# Patient Record
Sex: Male | Born: 1945 | Race: White | Hispanic: No | Marital: Married | Smoking: Former smoker
Health system: Southern US, Community
[De-identification: ages and names within clinical notes are randomized; demographics above are authoritative.]

## PROBLEM LIST (undated history)

## (undated) DIAGNOSIS — M1990 Unspecified osteoarthritis, unspecified site: Secondary | ICD-10-CM

## (undated) DIAGNOSIS — F32A Depression, unspecified: Secondary | ICD-10-CM

## (undated) DIAGNOSIS — F419 Anxiety disorder, unspecified: Secondary | ICD-10-CM

## (undated) DIAGNOSIS — Z87442 Personal history of urinary calculi: Secondary | ICD-10-CM

## (undated) DIAGNOSIS — I499 Cardiac arrhythmia, unspecified: Secondary | ICD-10-CM

## (undated) DIAGNOSIS — I10 Essential (primary) hypertension: Secondary | ICD-10-CM

## (undated) DIAGNOSIS — G4730 Sleep apnea, unspecified: Secondary | ICD-10-CM

## (undated) DIAGNOSIS — F329 Major depressive disorder, single episode, unspecified: Secondary | ICD-10-CM

## (undated) DIAGNOSIS — T8859XA Other complications of anesthesia, initial encounter: Secondary | ICD-10-CM

## (undated) HISTORY — DX: Anxiety disorder, unspecified: F41.9

## (undated) HISTORY — DX: Essential (primary) hypertension: I10

## (undated) HISTORY — PX: TONSILLECTOMY AND ADENOIDECTOMY: SUR1326

## (undated) HISTORY — DX: Unspecified osteoarthritis, unspecified site: M19.90

---
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
## 1988-03-08 HISTORY — PX: VASECTOMY: SHX75

## 2012-11-14 DIAGNOSIS — Z23 Encounter for immunization: Secondary | ICD-10-CM | POA: Diagnosis not present

## 2012-12-29 DIAGNOSIS — F3289 Other specified depressive episodes: Secondary | ICD-10-CM | POA: Diagnosis not present

## 2012-12-29 DIAGNOSIS — F329 Major depressive disorder, single episode, unspecified: Secondary | ICD-10-CM | POA: Diagnosis not present

## 2012-12-29 DIAGNOSIS — F988 Other specified behavioral and emotional disorders with onset usually occurring in childhood and adolescence: Secondary | ICD-10-CM | POA: Diagnosis not present

## 2013-02-26 DIAGNOSIS — M19079 Primary osteoarthritis, unspecified ankle and foot: Secondary | ICD-10-CM | POA: Diagnosis not present

## 2013-02-26 DIAGNOSIS — F988 Other specified behavioral and emotional disorders with onset usually occurring in childhood and adolescence: Secondary | ICD-10-CM | POA: Diagnosis not present

## 2013-02-26 DIAGNOSIS — E7800 Pure hypercholesterolemia, unspecified: Secondary | ICD-10-CM | POA: Diagnosis not present

## 2013-03-21 DIAGNOSIS — F329 Major depressive disorder, single episode, unspecified: Secondary | ICD-10-CM | POA: Diagnosis not present

## 2013-03-21 DIAGNOSIS — F3289 Other specified depressive episodes: Secondary | ICD-10-CM | POA: Diagnosis not present

## 2013-03-21 DIAGNOSIS — E7800 Pure hypercholesterolemia, unspecified: Secondary | ICD-10-CM | POA: Diagnosis not present

## 2013-03-21 DIAGNOSIS — F988 Other specified behavioral and emotional disorders with onset usually occurring in childhood and adolescence: Secondary | ICD-10-CM | POA: Diagnosis not present

## 2013-03-21 DIAGNOSIS — Z79899 Other long term (current) drug therapy: Secondary | ICD-10-CM | POA: Diagnosis not present

## 2013-03-21 DIAGNOSIS — F4322 Adjustment disorder with anxiety: Secondary | ICD-10-CM | POA: Diagnosis not present

## 2013-03-21 DIAGNOSIS — N200 Calculus of kidney: Secondary | ICD-10-CM | POA: Diagnosis not present

## 2013-03-28 DIAGNOSIS — N138 Other obstructive and reflux uropathy: Secondary | ICD-10-CM | POA: Diagnosis not present

## 2013-03-28 DIAGNOSIS — Z125 Encounter for screening for malignant neoplasm of prostate: Secondary | ICD-10-CM | POA: Diagnosis not present

## 2013-03-28 DIAGNOSIS — N200 Calculus of kidney: Secondary | ICD-10-CM | POA: Diagnosis not present

## 2013-03-28 DIAGNOSIS — R319 Hematuria, unspecified: Secondary | ICD-10-CM | POA: Diagnosis not present

## 2013-03-28 DIAGNOSIS — N401 Enlarged prostate with lower urinary tract symptoms: Secondary | ICD-10-CM | POA: Diagnosis not present

## 2013-03-28 DIAGNOSIS — R109 Unspecified abdominal pain: Secondary | ICD-10-CM | POA: Diagnosis not present

## 2013-04-03 DIAGNOSIS — L0291 Cutaneous abscess, unspecified: Secondary | ICD-10-CM | POA: Diagnosis not present

## 2013-04-03 DIAGNOSIS — L02219 Cutaneous abscess of trunk, unspecified: Secondary | ICD-10-CM | POA: Diagnosis not present

## 2013-04-03 DIAGNOSIS — L03319 Cellulitis of trunk, unspecified: Secondary | ICD-10-CM | POA: Diagnosis not present

## 2013-04-04 DIAGNOSIS — N138 Other obstructive and reflux uropathy: Secondary | ICD-10-CM | POA: Diagnosis not present

## 2013-04-04 DIAGNOSIS — N401 Enlarged prostate with lower urinary tract symptoms: Secondary | ICD-10-CM | POA: Diagnosis not present

## 2013-04-04 DIAGNOSIS — R109 Unspecified abdominal pain: Secondary | ICD-10-CM | POA: Diagnosis not present

## 2013-04-04 DIAGNOSIS — N200 Calculus of kidney: Secondary | ICD-10-CM | POA: Diagnosis not present

## 2013-04-10 DIAGNOSIS — L723 Sebaceous cyst: Secondary | ICD-10-CM | POA: Diagnosis not present

## 2013-04-12 DIAGNOSIS — K2970 Gastritis, unspecified, without bleeding: Secondary | ICD-10-CM | POA: Diagnosis not present

## 2013-04-12 DIAGNOSIS — F988 Other specified behavioral and emotional disorders with onset usually occurring in childhood and adolescence: Secondary | ICD-10-CM | POA: Diagnosis not present

## 2013-04-12 DIAGNOSIS — K2990 Gastroduodenitis, unspecified, without bleeding: Secondary | ICD-10-CM | POA: Diagnosis not present

## 2013-04-12 DIAGNOSIS — L723 Sebaceous cyst: Secondary | ICD-10-CM | POA: Diagnosis not present

## 2013-04-18 DIAGNOSIS — R195 Other fecal abnormalities: Secondary | ICD-10-CM | POA: Diagnosis not present

## 2013-04-18 DIAGNOSIS — R1013 Epigastric pain: Secondary | ICD-10-CM | POA: Diagnosis not present

## 2013-04-18 DIAGNOSIS — Z1211 Encounter for screening for malignant neoplasm of colon: Secondary | ICD-10-CM | POA: Diagnosis not present

## 2013-04-20 DIAGNOSIS — Z87891 Personal history of nicotine dependence: Secondary | ICD-10-CM | POA: Diagnosis not present

## 2013-04-20 DIAGNOSIS — K921 Melena: Secondary | ICD-10-CM | POA: Diagnosis not present

## 2013-04-20 DIAGNOSIS — K263 Acute duodenal ulcer without hemorrhage or perforation: Secondary | ICD-10-CM | POA: Diagnosis not present

## 2013-04-20 DIAGNOSIS — R195 Other fecal abnormalities: Secondary | ICD-10-CM | POA: Diagnosis not present

## 2013-04-20 DIAGNOSIS — K449 Diaphragmatic hernia without obstruction or gangrene: Secondary | ICD-10-CM | POA: Diagnosis not present

## 2013-04-20 DIAGNOSIS — R1013 Epigastric pain: Secondary | ICD-10-CM | POA: Diagnosis not present

## 2013-04-20 DIAGNOSIS — K222 Esophageal obstruction: Secondary | ICD-10-CM | POA: Diagnosis not present

## 2013-04-20 DIAGNOSIS — I10 Essential (primary) hypertension: Secondary | ICD-10-CM | POA: Diagnosis not present

## 2013-04-20 DIAGNOSIS — R109 Unspecified abdominal pain: Secondary | ICD-10-CM | POA: Diagnosis not present

## 2013-04-20 DIAGNOSIS — K269 Duodenal ulcer, unspecified as acute or chronic, without hemorrhage or perforation: Secondary | ICD-10-CM | POA: Diagnosis not present

## 2013-04-30 DIAGNOSIS — D649 Anemia, unspecified: Secondary | ICD-10-CM | POA: Diagnosis not present

## 2013-05-04 DIAGNOSIS — I10 Essential (primary) hypertension: Secondary | ICD-10-CM | POA: Diagnosis not present

## 2013-06-14 DIAGNOSIS — R195 Other fecal abnormalities: Secondary | ICD-10-CM | POA: Diagnosis not present

## 2013-06-14 DIAGNOSIS — K265 Chronic or unspecified duodenal ulcer with perforation: Secondary | ICD-10-CM | POA: Diagnosis not present

## 2013-06-22 DIAGNOSIS — N182 Chronic kidney disease, stage 2 (mild): Secondary | ICD-10-CM | POA: Diagnosis not present

## 2013-06-22 DIAGNOSIS — M549 Dorsalgia, unspecified: Secondary | ICD-10-CM | POA: Diagnosis not present

## 2013-06-22 DIAGNOSIS — G4710 Hypersomnia, unspecified: Secondary | ICD-10-CM | POA: Diagnosis not present

## 2013-06-22 DIAGNOSIS — M7989 Other specified soft tissue disorders: Secondary | ICD-10-CM | POA: Diagnosis not present

## 2013-06-26 DIAGNOSIS — M7989 Other specified soft tissue disorders: Secondary | ICD-10-CM | POA: Diagnosis not present

## 2013-06-26 DIAGNOSIS — R609 Edema, unspecified: Secondary | ICD-10-CM | POA: Diagnosis not present

## 2013-08-01 DIAGNOSIS — M5020 Other cervical disc displacement, unspecified cervical region: Secondary | ICD-10-CM | POA: Diagnosis not present

## 2013-08-01 DIAGNOSIS — M9981 Other biomechanical lesions of cervical region: Secondary | ICD-10-CM | POA: Diagnosis not present

## 2013-08-01 DIAGNOSIS — M999 Biomechanical lesion, unspecified: Secondary | ICD-10-CM | POA: Diagnosis not present

## 2013-08-09 DIAGNOSIS — N182 Chronic kidney disease, stage 2 (mild): Secondary | ICD-10-CM | POA: Diagnosis not present

## 2013-08-14 DIAGNOSIS — M999 Biomechanical lesion, unspecified: Secondary | ICD-10-CM | POA: Diagnosis not present

## 2013-08-14 DIAGNOSIS — E876 Hypokalemia: Secondary | ICD-10-CM | POA: Diagnosis not present

## 2013-08-14 DIAGNOSIS — M5020 Other cervical disc displacement, unspecified cervical region: Secondary | ICD-10-CM | POA: Diagnosis not present

## 2013-08-14 DIAGNOSIS — M9981 Other biomechanical lesions of cervical region: Secondary | ICD-10-CM | POA: Diagnosis not present

## 2013-08-16 DIAGNOSIS — M9981 Other biomechanical lesions of cervical region: Secondary | ICD-10-CM | POA: Diagnosis not present

## 2013-08-16 DIAGNOSIS — M999 Biomechanical lesion, unspecified: Secondary | ICD-10-CM | POA: Diagnosis not present

## 2013-08-16 DIAGNOSIS — M5020 Other cervical disc displacement, unspecified cervical region: Secondary | ICD-10-CM | POA: Diagnosis not present

## 2013-08-21 DIAGNOSIS — M5020 Other cervical disc displacement, unspecified cervical region: Secondary | ICD-10-CM | POA: Diagnosis not present

## 2013-08-21 DIAGNOSIS — M999 Biomechanical lesion, unspecified: Secondary | ICD-10-CM | POA: Diagnosis not present

## 2013-08-21 DIAGNOSIS — E876 Hypokalemia: Secondary | ICD-10-CM | POA: Diagnosis not present

## 2013-08-21 DIAGNOSIS — M9981 Other biomechanical lesions of cervical region: Secondary | ICD-10-CM | POA: Diagnosis not present

## 2013-09-04 DIAGNOSIS — M999 Biomechanical lesion, unspecified: Secondary | ICD-10-CM | POA: Diagnosis not present

## 2013-09-04 DIAGNOSIS — M9981 Other biomechanical lesions of cervical region: Secondary | ICD-10-CM | POA: Diagnosis not present

## 2013-09-04 DIAGNOSIS — E876 Hypokalemia: Secondary | ICD-10-CM | POA: Diagnosis not present

## 2013-09-04 DIAGNOSIS — M5020 Other cervical disc displacement, unspecified cervical region: Secondary | ICD-10-CM | POA: Diagnosis not present

## 2013-09-20 DIAGNOSIS — M9981 Other biomechanical lesions of cervical region: Secondary | ICD-10-CM | POA: Diagnosis not present

## 2013-09-20 DIAGNOSIS — M5020 Other cervical disc displacement, unspecified cervical region: Secondary | ICD-10-CM | POA: Diagnosis not present

## 2013-09-20 DIAGNOSIS — M999 Biomechanical lesion, unspecified: Secondary | ICD-10-CM | POA: Diagnosis not present

## 2013-09-24 DIAGNOSIS — N182 Chronic kidney disease, stage 2 (mild): Secondary | ICD-10-CM | POA: Diagnosis not present

## 2013-09-27 DIAGNOSIS — M25569 Pain in unspecified knee: Secondary | ICD-10-CM | POA: Diagnosis not present

## 2013-09-27 DIAGNOSIS — N182 Chronic kidney disease, stage 2 (mild): Secondary | ICD-10-CM | POA: Diagnosis not present

## 2013-09-27 DIAGNOSIS — M25519 Pain in unspecified shoulder: Secondary | ICD-10-CM | POA: Diagnosis not present

## 2013-10-10 DIAGNOSIS — M25819 Other specified joint disorders, unspecified shoulder: Secondary | ICD-10-CM | POA: Diagnosis not present

## 2013-12-11 DIAGNOSIS — M9902 Segmental and somatic dysfunction of thoracic region: Secondary | ICD-10-CM | POA: Diagnosis not present

## 2013-12-11 DIAGNOSIS — M9905 Segmental and somatic dysfunction of pelvic region: Secondary | ICD-10-CM | POA: Diagnosis not present

## 2013-12-11 DIAGNOSIS — M5413 Radiculopathy, cervicothoracic region: Secondary | ICD-10-CM | POA: Diagnosis not present

## 2013-12-11 DIAGNOSIS — M9901 Segmental and somatic dysfunction of cervical region: Secondary | ICD-10-CM | POA: Diagnosis not present

## 2013-12-12 DIAGNOSIS — M1711 Unilateral primary osteoarthritis, right knee: Secondary | ICD-10-CM | POA: Diagnosis not present

## 2014-01-01 DIAGNOSIS — Z23 Encounter for immunization: Secondary | ICD-10-CM | POA: Diagnosis not present

## 2014-01-01 DIAGNOSIS — F900 Attention-deficit hyperactivity disorder, predominantly inattentive type: Secondary | ICD-10-CM | POA: Diagnosis not present

## 2014-01-01 DIAGNOSIS — R319 Hematuria, unspecified: Secondary | ICD-10-CM | POA: Diagnosis not present

## 2014-01-01 DIAGNOSIS — N182 Chronic kidney disease, stage 2 (mild): Secondary | ICD-10-CM | POA: Diagnosis not present

## 2014-01-23 DIAGNOSIS — M1711 Unilateral primary osteoarthritis, right knee: Secondary | ICD-10-CM | POA: Diagnosis not present

## 2014-01-23 DIAGNOSIS — M25561 Pain in right knee: Secondary | ICD-10-CM | POA: Diagnosis not present

## 2014-02-05 DIAGNOSIS — M1711 Unilateral primary osteoarthritis, right knee: Secondary | ICD-10-CM | POA: Diagnosis not present

## 2014-02-05 DIAGNOSIS — R262 Difficulty in walking, not elsewhere classified: Secondary | ICD-10-CM | POA: Diagnosis not present

## 2014-02-05 DIAGNOSIS — M25561 Pain in right knee: Secondary | ICD-10-CM | POA: Diagnosis not present

## 2014-02-07 DIAGNOSIS — M1711 Unilateral primary osteoarthritis, right knee: Secondary | ICD-10-CM | POA: Diagnosis not present

## 2014-02-07 DIAGNOSIS — M25561 Pain in right knee: Secondary | ICD-10-CM | POA: Diagnosis not present

## 2014-02-07 DIAGNOSIS — R262 Difficulty in walking, not elsewhere classified: Secondary | ICD-10-CM | POA: Diagnosis not present

## 2014-02-12 DIAGNOSIS — R262 Difficulty in walking, not elsewhere classified: Secondary | ICD-10-CM | POA: Diagnosis not present

## 2014-02-12 DIAGNOSIS — M1711 Unilateral primary osteoarthritis, right knee: Secondary | ICD-10-CM | POA: Diagnosis not present

## 2014-02-12 DIAGNOSIS — M25561 Pain in right knee: Secondary | ICD-10-CM | POA: Diagnosis not present

## 2014-02-15 DIAGNOSIS — R262 Difficulty in walking, not elsewhere classified: Secondary | ICD-10-CM | POA: Diagnosis not present

## 2014-02-15 DIAGNOSIS — M1711 Unilateral primary osteoarthritis, right knee: Secondary | ICD-10-CM | POA: Diagnosis not present

## 2014-02-15 DIAGNOSIS — M25561 Pain in right knee: Secondary | ICD-10-CM | POA: Diagnosis not present

## 2014-02-19 DIAGNOSIS — M1711 Unilateral primary osteoarthritis, right knee: Secondary | ICD-10-CM | POA: Diagnosis not present

## 2014-02-19 DIAGNOSIS — M25561 Pain in right knee: Secondary | ICD-10-CM | POA: Diagnosis not present

## 2014-02-19 DIAGNOSIS — R262 Difficulty in walking, not elsewhere classified: Secondary | ICD-10-CM | POA: Diagnosis not present

## 2014-02-22 DIAGNOSIS — M1711 Unilateral primary osteoarthritis, right knee: Secondary | ICD-10-CM | POA: Diagnosis not present

## 2014-02-22 DIAGNOSIS — M25561 Pain in right knee: Secondary | ICD-10-CM | POA: Diagnosis not present

## 2014-02-22 DIAGNOSIS — R262 Difficulty in walking, not elsewhere classified: Secondary | ICD-10-CM | POA: Diagnosis not present

## 2014-02-26 DIAGNOSIS — M1711 Unilateral primary osteoarthritis, right knee: Secondary | ICD-10-CM | POA: Diagnosis not present

## 2014-02-26 DIAGNOSIS — R262 Difficulty in walking, not elsewhere classified: Secondary | ICD-10-CM | POA: Diagnosis not present

## 2014-02-26 DIAGNOSIS — M25561 Pain in right knee: Secondary | ICD-10-CM | POA: Diagnosis not present

## 2014-02-28 DIAGNOSIS — M25561 Pain in right knee: Secondary | ICD-10-CM | POA: Diagnosis not present

## 2014-02-28 DIAGNOSIS — R262 Difficulty in walking, not elsewhere classified: Secondary | ICD-10-CM | POA: Diagnosis not present

## 2014-02-28 DIAGNOSIS — M1711 Unilateral primary osteoarthritis, right knee: Secondary | ICD-10-CM | POA: Diagnosis not present

## 2014-03-07 DIAGNOSIS — M25561 Pain in right knee: Secondary | ICD-10-CM | POA: Diagnosis not present

## 2014-03-07 DIAGNOSIS — M1711 Unilateral primary osteoarthritis, right knee: Secondary | ICD-10-CM | POA: Diagnosis not present

## 2014-03-07 DIAGNOSIS — R262 Difficulty in walking, not elsewhere classified: Secondary | ICD-10-CM | POA: Diagnosis not present

## 2014-03-12 DIAGNOSIS — M25561 Pain in right knee: Secondary | ICD-10-CM | POA: Diagnosis not present

## 2014-03-12 DIAGNOSIS — R262 Difficulty in walking, not elsewhere classified: Secondary | ICD-10-CM | POA: Diagnosis not present

## 2014-03-12 DIAGNOSIS — M1711 Unilateral primary osteoarthritis, right knee: Secondary | ICD-10-CM | POA: Diagnosis not present

## 2014-03-13 DIAGNOSIS — M1711 Unilateral primary osteoarthritis, right knee: Secondary | ICD-10-CM | POA: Diagnosis not present

## 2014-03-13 DIAGNOSIS — M25561 Pain in right knee: Secondary | ICD-10-CM | POA: Diagnosis not present

## 2014-03-14 DIAGNOSIS — M1711 Unilateral primary osteoarthritis, right knee: Secondary | ICD-10-CM | POA: Diagnosis not present

## 2014-03-14 DIAGNOSIS — M25561 Pain in right knee: Secondary | ICD-10-CM | POA: Diagnosis not present

## 2014-03-14 DIAGNOSIS — R262 Difficulty in walking, not elsewhere classified: Secondary | ICD-10-CM | POA: Diagnosis not present

## 2014-03-20 DIAGNOSIS — M1711 Unilateral primary osteoarthritis, right knee: Secondary | ICD-10-CM | POA: Diagnosis not present

## 2014-03-27 DIAGNOSIS — M1711 Unilateral primary osteoarthritis, right knee: Secondary | ICD-10-CM | POA: Diagnosis not present

## 2014-03-28 DIAGNOSIS — R262 Difficulty in walking, not elsewhere classified: Secondary | ICD-10-CM | POA: Diagnosis not present

## 2014-03-28 DIAGNOSIS — M25561 Pain in right knee: Secondary | ICD-10-CM | POA: Diagnosis not present

## 2014-03-28 DIAGNOSIS — M1711 Unilateral primary osteoarthritis, right knee: Secondary | ICD-10-CM | POA: Diagnosis not present

## 2014-04-02 DIAGNOSIS — M1711 Unilateral primary osteoarthritis, right knee: Secondary | ICD-10-CM | POA: Diagnosis not present

## 2014-04-02 DIAGNOSIS — M25561 Pain in right knee: Secondary | ICD-10-CM | POA: Diagnosis not present

## 2014-04-02 DIAGNOSIS — R262 Difficulty in walking, not elsewhere classified: Secondary | ICD-10-CM | POA: Diagnosis not present

## 2014-04-04 DIAGNOSIS — R262 Difficulty in walking, not elsewhere classified: Secondary | ICD-10-CM | POA: Diagnosis not present

## 2014-04-04 DIAGNOSIS — M1711 Unilateral primary osteoarthritis, right knee: Secondary | ICD-10-CM | POA: Diagnosis not present

## 2014-04-04 DIAGNOSIS — M79672 Pain in left foot: Secondary | ICD-10-CM | POA: Diagnosis not present

## 2014-04-04 DIAGNOSIS — M25561 Pain in right knee: Secondary | ICD-10-CM | POA: Diagnosis not present

## 2014-04-15 DIAGNOSIS — L720 Epidermal cyst: Secondary | ICD-10-CM | POA: Diagnosis not present

## 2014-04-16 DIAGNOSIS — M1711 Unilateral primary osteoarthritis, right knee: Secondary | ICD-10-CM | POA: Diagnosis not present

## 2014-04-16 DIAGNOSIS — R262 Difficulty in walking, not elsewhere classified: Secondary | ICD-10-CM | POA: Diagnosis not present

## 2014-04-16 DIAGNOSIS — M25572 Pain in left ankle and joints of left foot: Secondary | ICD-10-CM | POA: Diagnosis not present

## 2014-04-16 DIAGNOSIS — M25561 Pain in right knee: Secondary | ICD-10-CM | POA: Diagnosis not present

## 2014-04-18 DIAGNOSIS — M25561 Pain in right knee: Secondary | ICD-10-CM | POA: Diagnosis not present

## 2014-04-18 DIAGNOSIS — M1711 Unilateral primary osteoarthritis, right knee: Secondary | ICD-10-CM | POA: Diagnosis not present

## 2014-04-18 DIAGNOSIS — M25572 Pain in left ankle and joints of left foot: Secondary | ICD-10-CM | POA: Diagnosis not present

## 2014-04-18 DIAGNOSIS — R262 Difficulty in walking, not elsewhere classified: Secondary | ICD-10-CM | POA: Diagnosis not present

## 2014-04-18 DIAGNOSIS — L720 Epidermal cyst: Secondary | ICD-10-CM | POA: Diagnosis not present

## 2014-04-23 DIAGNOSIS — M25561 Pain in right knee: Secondary | ICD-10-CM | POA: Diagnosis not present

## 2014-04-23 DIAGNOSIS — M1711 Unilateral primary osteoarthritis, right knee: Secondary | ICD-10-CM | POA: Diagnosis not present

## 2014-04-23 DIAGNOSIS — M25572 Pain in left ankle and joints of left foot: Secondary | ICD-10-CM | POA: Diagnosis not present

## 2014-04-23 DIAGNOSIS — R262 Difficulty in walking, not elsewhere classified: Secondary | ICD-10-CM | POA: Diagnosis not present

## 2014-05-14 DIAGNOSIS — D485 Neoplasm of uncertain behavior of skin: Secondary | ICD-10-CM | POA: Diagnosis not present

## 2014-05-14 DIAGNOSIS — D225 Melanocytic nevi of trunk: Secondary | ICD-10-CM | POA: Diagnosis not present

## 2014-05-14 DIAGNOSIS — N182 Chronic kidney disease, stage 2 (mild): Secondary | ICD-10-CM | POA: Diagnosis not present

## 2014-05-16 DIAGNOSIS — M79609 Pain in unspecified limb: Secondary | ICD-10-CM | POA: Diagnosis not present

## 2014-05-16 DIAGNOSIS — Z23 Encounter for immunization: Secondary | ICD-10-CM | POA: Diagnosis not present

## 2014-05-16 DIAGNOSIS — Z0000 Encounter for general adult medical examination without abnormal findings: Secondary | ICD-10-CM | POA: Diagnosis not present

## 2014-05-16 DIAGNOSIS — K219 Gastro-esophageal reflux disease without esophagitis: Secondary | ICD-10-CM | POA: Diagnosis not present

## 2014-05-16 DIAGNOSIS — I10 Essential (primary) hypertension: Secondary | ICD-10-CM | POA: Diagnosis not present

## 2014-05-16 DIAGNOSIS — F900 Attention-deficit hyperactivity disorder, predominantly inattentive type: Secondary | ICD-10-CM | POA: Diagnosis not present

## 2014-05-23 DIAGNOSIS — M6528 Calcific tendinitis, other site: Secondary | ICD-10-CM | POA: Diagnosis not present

## 2014-06-06 ENCOUNTER — Ambulatory Visit (INDEPENDENT_AMBULATORY_CARE_PROVIDER_SITE_OTHER): Payer: Medicare Other

## 2014-06-06 VITALS — BP 139/98 | HR 84 | Resp 12

## 2014-06-06 DIAGNOSIS — M79671 Pain in right foot: Secondary | ICD-10-CM

## 2014-06-06 DIAGNOSIS — G5761 Lesion of plantar nerve, right lower limb: Secondary | ICD-10-CM

## 2014-06-06 DIAGNOSIS — M7751 Other enthesopathy of right foot: Secondary | ICD-10-CM | POA: Diagnosis not present

## 2014-06-06 DIAGNOSIS — R52 Pain, unspecified: Secondary | ICD-10-CM | POA: Diagnosis not present

## 2014-06-06 DIAGNOSIS — M778 Other enthesopathies, not elsewhere classified: Secondary | ICD-10-CM

## 2014-06-06 DIAGNOSIS — M779 Enthesopathy, unspecified: Secondary | ICD-10-CM

## 2014-06-06 NOTE — Progress Notes (Signed)
   Subjective:    Patient ID: Brett Rosales, male    DOB: [birth year], 69 y.o.   MRN: [id number]  HPI  PT STATED RT BALL/BOTTOM OF THE TOES HAVE TINGLING SENSATION, SORE FOR 2 MONTHS. THE FOOT IS GETTING WORSE DURING THE DAY AND GET AGGRAVATED BY SITTING/WALKING. TRIED NO TREATMENT  Review of Systems  Musculoskeletal: Positive for gait problem.  All other systems reviewed and are negative.      Objective:   Physical Exam 69 year old white male well-developed well-nourished oriented 3 presents at this time with complaint of pain points to the second toe second MTP area of the right foot 70 some mild heel pain or posterior spurring of the calcaneus on the left although that seems to be being managed wearing a good pair of [employer] running shoes patient's at this time is concerned about the pain tingling burning and stinging affecting the second toe which on palpation at this time reveals tenderness in the second interspace between the second and third MTP areas. Pain on direct lateral compression is noted certain shoes of aggravated this including some Reeboks a recently wore it may have been very curved and aggravated the interspace area. X-rays reveal no signs of fracture there is bipartite fibular sesamoid mild digital contractures 2 through 5 adductovarus rotated fourth and fifth otherwise rectus foot type noted with mild inferior and some mild retrocalcaneal spurring mild promontory changes on lateral projection noted as well. 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 which can be a part of the problem he did wear some old Reeboks shoes recently which may have aggravated or agitated this area however this time again pain reproducible on  compression of the interspace positive Biagio Borg sign is noted plantar this time injection tendons Kenalog 20 mg Xylocaine plain infiltrated into the first intermetatarsal space along second common digital nerve patient tolerated the injection was recommended to use ice Tylenol as needed for pain wide accommodative shoes check and 03 or 4 weeks for follow-up and he continues have problems additional steroid injections or consideration for alcohol injections or even more invasive options if symptoms persist  Harriet Masson DPM

## 2014-06-06 NOTE — Patient Instructions (Signed)
ICE INSTRUCTIONS  Apply ice or cold pack to the affected area at least 3 times a day for 10-15 minutes each time.  You should also use ice after prolonged activity or vigorous exercise.  Do not apply ice longer than 20 minutes at one time.  Always keep a cloth between your skin and the ice pack to prevent burns.  Being consistent and following these instructions will help control your symptoms.  We suggest you purchase a gel ice pack because they are reusable and do bit leak.  Some of them are designed to wrap around the area.  Use the method that works best for you.  Here are some other suggestions for icing.   Use a frozen bag of peas or corn-inexpensive and molds well to your body, usually stays frozen for 10 to 20 minutes.  Wet a towel with cold water and squeeze out the excess until it's damp.  Place in a bag in the freezer for 20 minutes. Then remove and use.  Also can use a frozen bottle of water as an ice pack such as a frozen rolling pin roll on the ball of the foot multiple times a day 15 minutes on 15 minutes off  Maintain a stable thick soled shoe at all times avoid anything tight or constrictive or curved

## 2014-06-27 ENCOUNTER — Encounter: Payer: Self-pay | Admitting: Podiatrist

## 2014-06-27 ENCOUNTER — Ambulatory Visit (INDEPENDENT_AMBULATORY_CARE_PROVIDER_SITE_OTHER): Payer: Medicare Other | Admitting: Podiatrist

## 2014-06-27 VITALS — BP 141/92 | HR 76 | Resp 12

## 2014-06-27 DIAGNOSIS — G5761 Lesion of plantar nerve, right lower limb: Secondary | ICD-10-CM | POA: Diagnosis not present

## 2014-06-27 NOTE — Progress Notes (Signed)
   Subjective: Patient presents today for follow-up of foot pain right foot. At last visit he received a Kenalog injection into the second interspace of the right foot and states that it took the pain away however he still gets a tingling sensation and feels like there is a balled up sock feeling still present. At times he'll still have a zinging type of discomfort between the first and second toes of the right foot. He also feels like there is a spacer between the first and second toes of the right foot even though there is nothing present.  Objective: Neurovascular status is intact and unchanged neuroma type symptomatology second interspace of the right foot is elicited clinically. Palpable click is noted and pain with direct pressure is also observed.  Assessment: Neuroma second interspace right foot  Plan: Recommended a second steroid injection into the area of maximal tenderness to help with the feeling of "fullness" and the nerve pain. This was accomplished today under sterile technique and without complication with 20 mg of Kenalog and Marcaine plain. A recheck appointment is set up for 2 weeks. If the foot feels better and he does not need any more treatment he will calling E appointment otherwise we will start alcohol sclerosing injections at 2 week appointment.

## 2014-06-27 NOTE — Patient Instructions (Signed)
Morton's Neuroma in Sports  (Interdigital Plantar Neuroma) Morton's neuroma is a condition of the nervous system that results in pain or loss of feeling in the toes. The disease is caused by the bones of the foot squeezing the nerve that runs between two toes (interdigital nerve). The third and fourth toes are most likely to be affected by this disease. SYMPTOMS   Tingling, numbness, burning, or electric shocks in the front of the foot, often involving the third and fourth toes, although it may involve any other pair of toes.  Pain and tenderness in the front of the foot, that gets worse when walking.  Pain that gets worse when pressure is applied to the foot (wearing shoes).  Severe pain in the front of the foot, when standing on the front of the foot (on tiptoes), such as with running, jumping, pivoting, or dancing. CAUSES  Morton's neuroma is caused by swelling of the nerve between two toes. This swelling causes the nerve to be pinched between the bones of the foot. RISK INCREASES WITH:  Recurring foot or ankle injuries.  Poor fitting or worn shoes, with minimal padding and shock absorbers.  Loose ligaments of the foot, causing thickening of the nerve.  Poor foot strength and flexibility. PREVENTION  Warm up and stretch properly before activity.  Maintain physical fitness:  Foot and ankle flexibility.  Muscle strength and endurance.  Cardiovascular fitness.  Wear properly fitted and padded shoes.  Wear arch supports (orthotics), when needed. PROGNOSIS  If treated properly, Morton's neuroma can usually be cured with non-surgical treatment. For certain cases, surgery may be needed. RELATED COMPLICATIONS  Permanent numbness and pain in the foot.  Inability to participate in athletics, because of pain. TREATMENT Treatment first involves stopping any activities that make the symptoms worse. The use of ice and medicine will help reduce pain and inflammation. Wearing shoes  with a wide toe box, and an orthotic arch support or metatarsal bar, may also reduce pain. Your caregiver may give you a corticosteroid injection, to further reduce inflammation. If non-surgical treatment is unsuccessful, surgery may be needed. Surgery to fix Morton's neuroma is often performed as an outpatient procedure, meaning you can go home the same day as the surgery. The procedure involves removing the source of pressure on the nerve. If it is necessary to remove the nerve, you can expect persistent numbness. MEDICATION  If pain medicine is needed, nonsteroidal anti-inflammatory medicines (aspirin and ibuprofen), or other minor pain relievers (acetaminophen), are often advised.  Do not take pain medicine for 7 days before surgery.  Prescription pain relievers are usually prescribed only after surgery. Use only as directed and only as much as you need.  Corticosteroid injections are used in extreme cases, to reduce inflammation. These injections should be done only if necessary, because they may be given only a limited number of times. HEAT AND COLD  Cold treatment (icing) should be applied for 10 to 15 minutes every 2 to 3 hours for inflammation and pain, and immediately after activity that aggravates your symptoms. Use ice packs or an ice massage.  Heat treatment may be used before performing stretching and strengthening activities prescribed by your caregiver, physical therapist, or athletic trainer. Use a heat pack or a warm water soak. SEEK MEDICAL CARE IF:   Symptoms get worse or do not improve in 2 weeks, despite treatment.  After surgery you develop increasing pain, swelling, redness, increased warmth, bleeding, drainage of fluids, or fever.  New, unexplained symptoms develop. (  Drugs used in treatment may produce side effects.) Document Released: 12/30/2004 Document Revised: 05/17/2011 Document Reviewed: 06/06/2008 Exit[employer] Patient Information 2015 [employer]. This  information is not intended to replace advice given to you by your health care provider. Make sure you discuss any questions you have with your health care provider.  

## 2014-07-11 ENCOUNTER — Encounter: Payer: Self-pay | Admitting: Podiatrist

## 2014-07-11 ENCOUNTER — Ambulatory Visit (INDEPENDENT_AMBULATORY_CARE_PROVIDER_SITE_OTHER): Payer: Medicare Other | Admitting: Podiatrist

## 2014-07-11 VITALS — BP 130/87 | HR 77 | Resp 18

## 2014-07-11 DIAGNOSIS — G5761 Lesion of plantar nerve, right lower limb: Secondary | ICD-10-CM | POA: Diagnosis not present

## 2014-07-11 NOTE — Patient Instructions (Signed)
Morton's Neuroma in Sports  (Interdigital Plantar Neuroma) Morton's neuroma is a condition of the nervous system that results in pain or loss of feeling in the toes. The disease is caused by the bones of the foot squeezing the nerve that runs between two toes (interdigital nerve). The third and fourth toes are most likely to be affected by this disease. SYMPTOMS   Tingling, numbness, burning, or electric shocks in the front of the foot, often involving the third and fourth toes, although it may involve any other pair of toes.  Pain and tenderness in the front of the foot, that gets worse when walking.  Pain that gets worse when pressure is applied to the foot (wearing shoes).  Severe pain in the front of the foot, when standing on the front of the foot (on tiptoes), such as with running, jumping, pivoting, or dancing. CAUSES  Morton's neuroma is caused by swelling of the nerve between two toes. This swelling causes the nerve to be pinched between the bones of the foot. RISK INCREASES WITH:  Recurring foot or ankle injuries.  Poor fitting or worn shoes, with minimal padding and shock absorbers.  Loose ligaments of the foot, causing thickening of the nerve.  Poor foot strength and flexibility. PREVENTION  Warm up and stretch properly before activity.  Maintain physical fitness:  Foot and ankle flexibility.  Muscle strength and endurance.  Cardiovascular fitness.  Wear properly fitted and padded shoes.  Wear arch supports (orthotics), when needed. PROGNOSIS  If treated properly, Morton's neuroma can usually be cured with non-surgical treatment. For certain cases, surgery may be needed. RELATED COMPLICATIONS  Permanent numbness and pain in the foot.  Inability to participate in athletics, because of pain. TREATMENT Treatment first involves stopping any activities that make the symptoms worse. The use of ice and medicine will help reduce pain and inflammation. Wearing shoes  with a wide toe box, and an orthotic arch support or metatarsal bar, may also reduce pain. Your caregiver may give you a corticosteroid injection, to further reduce inflammation. If non-surgical treatment is unsuccessful, surgery may be needed. Surgery to fix Morton's neuroma is often performed as an outpatient procedure, meaning you can go home the same day as the surgery. The procedure involves removing the source of pressure on the nerve. If it is necessary to remove the nerve, you can expect persistent numbness. MEDICATION  If pain medicine is needed, nonsteroidal anti-inflammatory medicines (aspirin and ibuprofen), or other minor pain relievers (acetaminophen), are often advised.  Do not take pain medicine for 7 days before surgery.  Prescription pain relievers are usually prescribed only after surgery. Use only as directed and only as much as you need.  Corticosteroid injections are used in extreme cases, to reduce inflammation. These injections should be done only if necessary, because they may be given only a limited number of times. HEAT AND COLD  Cold treatment (icing) should be applied for 10 to 15 minutes every 2 to 3 hours for inflammation and pain, and immediately after activity that aggravates your symptoms. Use ice packs or an ice massage.  Heat treatment may be used before performing stretching and strengthening activities prescribed by your caregiver, physical therapist, or athletic trainer. Use a heat pack or a warm water soak. SEEK MEDICAL CARE IF:   Symptoms get worse or do not improve in 2 weeks, despite treatment.  After surgery you develop increasing pain, swelling, redness, increased warmth, bleeding, drainage of fluids, or fever.  New, unexplained symptoms develop. (  Drugs used in treatment may produce side effects.) Document Released: 12/30/2004 Document Revised: 05/17/2011 Document Reviewed: 06/06/2008 Exit[employer] Patient Information 2015 [employer]. This  information is not intended to replace advice given to you by your health care provider. Make sure you discuss any questions you have with your health care provider.  

## 2014-07-11 NOTE — Progress Notes (Signed)
   Subjective: Patient presents today for follow-up of foot pain right foot. At last visit he received a Kenalog injection into the second interspace of the right foot and states it helped significantly however he still gets a tingling sensation and feels like there is a balled up sock feeling still present.  Objective: Neurovascular status is intact and unchanged neuroma type symptomatology second interspace of the right foot is elicited clinically. Palpable click is noted and pain with direct pressure is also observed.  Assessment: Neuroma second interspace right foot  Plan: Recommended a third and final steroid injection into the area of maximal tenderness. This was accomplished today under sterile technique and without complication with 20 mg of Kenalog and Marcaine plain. A recheck appointment is set up for 2 weeks. If the foot feels better he will cancel the next appointment otherwise we will start alcohol sclerosing injections at 2 week appointment.

## 2014-07-25 ENCOUNTER — Encounter: Payer: Self-pay | Admitting: Podiatrist

## 2014-07-25 ENCOUNTER — Ambulatory Visit (INDEPENDENT_AMBULATORY_CARE_PROVIDER_SITE_OTHER): Payer: Medicare Other | Admitting: Podiatrist

## 2014-07-25 VITALS — BP 138/84 | HR 83 | Resp 18

## 2014-07-25 DIAGNOSIS — G5761 Lesion of plantar nerve, right lower limb: Secondary | ICD-10-CM | POA: Diagnosis not present

## 2014-07-25 NOTE — Progress Notes (Signed)
   Subjective: Patient presents today for follow-up of foot pain right foot. At last visit he received a Kenalog injection into the second interspace of the right foot and states it helped significantly however he still gets a tingling sensation and feels like there is a balled up sock feeling still present.  Objective: Neurovascular status is intact and unchanged neuroma type symptomatology second interspace of the right foot is elicited clinically. Palpable click is noted and pain with direct pressure is also observed.  Assessment: Neuroma second interspace right foot  Plan: Recommended initiation of alcohol shots. Alcohol shot #1 was carried out today the second interspace without consultation. He'll be seen back in 1 week and alcohol injection #2 will be carried out at that time. Any problems or concerns arise he instructed to call immediately.

## 2014-08-01 ENCOUNTER — Encounter: Payer: Self-pay | Admitting: Podiatrist

## 2014-08-01 ENCOUNTER — Ambulatory Visit (INDEPENDENT_AMBULATORY_CARE_PROVIDER_SITE_OTHER): Payer: Medicare Other | Admitting: Podiatrist

## 2014-08-01 VITALS — BP 140/80 | HR 79 | Resp 18

## 2014-08-01 DIAGNOSIS — G5761 Lesion of plantar nerve, right lower limb: Secondary | ICD-10-CM | POA: Diagnosis not present

## 2014-08-01 NOTE — Progress Notes (Signed)
   Subjective: Patient presents today for follow-up of foot pain right foot. He relates the first alcohol injection helped out but the pain has just come back. He is here for injection 2.   Objective: Neurovascular status is intact and unchanged neuroma type symptomatology second interspace of the right foot is elicited clinically. Palpable click is noted and pain with direct pressure is also observed.  Assessment: Neuroma second interspace right foot- injection 2 etoh.  Plan: Recommended initiation of alcohol shots. Alcohol shot #1 was carried out today the second interspace . He'll be seen back in 1 week and alcohol injection #2 will be carried out at that time. Any problems or concerns arise he instructed to call immediately.

## 2014-08-12 ENCOUNTER — Encounter: Payer: Self-pay | Admitting: Podiatry

## 2014-08-12 ENCOUNTER — Ambulatory Visit (INDEPENDENT_AMBULATORY_CARE_PROVIDER_SITE_OTHER): Payer: Medicare Other | Admitting: Podiatry

## 2014-08-12 VITALS — BP 142/76 | HR 81 | Resp 18

## 2014-08-12 DIAGNOSIS — G5761 Lesion of plantar nerve, right lower limb: Secondary | ICD-10-CM | POA: Diagnosis not present

## 2014-08-12 DIAGNOSIS — M79671 Pain in right foot: Secondary | ICD-10-CM | POA: Diagnosis not present

## 2014-08-12 NOTE — Progress Notes (Signed)
Subjective:     Patient ID: Brett Rosales, male   DOB: [birth year], 69 y.o.   MRN: [id number]  HPI   This patient presents with history of neuroma second interspace right foot.  He has received two alcohol injections and he is 67% better and he presents for his third injection today.  Review of Systems     Objective:   Physical Exam  Neurovascular status is intact and unchanged .  Palpable pain second interspace right foot with no redness or swelling.     Assessment:     Neuroma second interspace right foot     Plan:     Performed third alcohol injection and told to return for another injection in 2 weeks.

## 2014-08-12 NOTE — Progress Notes (Signed)
Patient ID: Brett Rosales, male   DOB: Sep 08, 1945, 69 y.o.   MRN: [id number]

## 2014-08-26 ENCOUNTER — Ambulatory Visit: Payer: Medicare Other | Admitting: Podiatry

## 2014-09-10 DIAGNOSIS — L559 Sunburn, unspecified: Secondary | ICD-10-CM | POA: Diagnosis not present

## 2014-09-10 DIAGNOSIS — E780 Pure hypercholesterolemia: Secondary | ICD-10-CM | POA: Diagnosis not present

## 2014-09-10 DIAGNOSIS — M7989 Other specified soft tissue disorders: Secondary | ICD-10-CM | POA: Diagnosis not present

## 2014-09-11 ENCOUNTER — Encounter: Payer: Self-pay | Admitting: Podiatry

## 2014-09-11 ENCOUNTER — Ambulatory Visit (INDEPENDENT_AMBULATORY_CARE_PROVIDER_SITE_OTHER): Payer: Medicare Other | Admitting: Podiatry

## 2014-09-11 VITALS — BP 132/86 | HR 69 | Resp 12

## 2014-09-11 DIAGNOSIS — G5761 Lesion of plantar nerve, right lower limb: Secondary | ICD-10-CM

## 2014-09-11 NOTE — Progress Notes (Signed)
Subjective:     Patient ID: Brett Rosales, male   DOB: 1945/06/09, 69 y.o.   MRN: [id number]  HPIThis patient returns to the office for continued treatment for neuroma second interspace right foot.  This is fourth scheduled alcohol injection.  He says he is 90% better.   Review of Systems     Objective:   Physical Exam Objective: Review of past medical history, medications, social history and allergies were performed.  Vascular: Dorsalis pedis and posterior tibial pulses were palpable B/L, capillary refill was  WNL B/L, temperature gradient was WNL B/L   Skin:  No signs of symptoms of infection or ulcers on both feet  Nails: appear healthy with no signs of mycosis or infections  Sensory: Semmes Weinstein monifilament WNL   Orthopedic: Orthopedic evaluation demonstrates all joints distal t ankle have full ROM without crepitus, muscle power WNL B/L.  Palpable pain second and third interspace right foot.  He has been dealing with generalized edema both feet.     Assessment:     Neuroma second interspace right foot     Plan:     Performed alcohol injection.

## 2014-09-30 ENCOUNTER — Encounter: Payer: Self-pay | Admitting: Podiatry

## 2014-09-30 ENCOUNTER — Ambulatory Visit (INDEPENDENT_AMBULATORY_CARE_PROVIDER_SITE_OTHER): Payer: Medicare Other | Admitting: Podiatry

## 2014-09-30 VITALS — BP 130/72 | HR 75 | Resp 18

## 2014-09-30 DIAGNOSIS — G5761 Lesion of plantar nerve, right lower limb: Secondary | ICD-10-CM

## 2014-09-30 DIAGNOSIS — I890 Lymphedema, not elsewhere classified: Secondary | ICD-10-CM | POA: Diagnosis not present

## 2014-09-30 NOTE — Progress Notes (Signed)
Subjective:     Patient ID: Brett Rosales, male   DOB: 01-14-1946, 69 y.o.   MRN: [id number]  HPI This patient presents to the office for his fifth alcohol injection second interspace right foot.  He says the area has worsened which he says he has been dealing with swelling in his right foot not left.  He has no palpable pain second interspace right foot. His swelling has worsened and I believe is contributing to his nerve pain.   Review of Systems     Objective:   Physical Exam GENERAL APPEARANCE: Alert, conversant. Appropriately groomed. No acute distress.  VASCULAR: Pedal pulses palpable at 2/4 DP and PT bilateral.  Capillary refill time is immediate to all digits,  Proximal to distal cooling it warm to warm.  Digital hair growth is present bilateral  NEUROLOGIC: sensation is intact epicritically and protectively to 5.07 monofilament at 5/5 sites bilateral.  Light touch is intact bilateral, vibratory sensation intact bilateral, achilles tendon reflex is intact bilateral.  MUSCULOSKELETAL: acceptable muscle strength, tone and stability bilateral.  Intrinsic muscluature intact bilateral.  Rectus appearance of foot and digits noted bilateral. Palpable pain minimal in second and third interspaces right foot.  DERMATOLOGIC: skin color, texture, and turgor are within normal limits.  No preulcerative lesions or ulcers  are seen, no interdigital maceration noted.  No open lesions present.  Digital nails are asymptomatic. No drainage noted.     Assessment:     Lymphedema right foot     Plan:     Rov.  Anklet dispensed.  I believe the swelling is limiting the effectiveness of the alcohol shots.  RTC 1 week

## 2014-10-03 DIAGNOSIS — M7989 Other specified soft tissue disorders: Secondary | ICD-10-CM | POA: Diagnosis not present

## 2014-10-03 DIAGNOSIS — F329 Major depressive disorder, single episode, unspecified: Secondary | ICD-10-CM | POA: Diagnosis not present

## 2014-10-09 ENCOUNTER — Ambulatory Visit: Payer: Medicare Other | Admitting: Podiatry

## 2014-12-24 DIAGNOSIS — Z23 Encounter for immunization: Secondary | ICD-10-CM | POA: Diagnosis not present

## 2015-01-02 DIAGNOSIS — I10 Essential (primary) hypertension: Secondary | ICD-10-CM | POA: Diagnosis not present

## 2015-01-02 DIAGNOSIS — E7800 Pure hypercholesterolemia, unspecified: Secondary | ICD-10-CM | POA: Diagnosis not present

## 2015-01-02 DIAGNOSIS — R739 Hyperglycemia, unspecified: Secondary | ICD-10-CM | POA: Diagnosis not present

## 2015-01-03 DIAGNOSIS — E7800 Pure hypercholesterolemia, unspecified: Secondary | ICD-10-CM | POA: Diagnosis not present

## 2015-01-03 DIAGNOSIS — E8881 Metabolic syndrome: Secondary | ICD-10-CM | POA: Diagnosis not present

## 2015-01-09 DIAGNOSIS — M9905 Segmental and somatic dysfunction of pelvic region: Secondary | ICD-10-CM | POA: Diagnosis not present

## 2015-01-09 DIAGNOSIS — M9901 Segmental and somatic dysfunction of cervical region: Secondary | ICD-10-CM | POA: Diagnosis not present

## 2015-01-09 DIAGNOSIS — M9902 Segmental and somatic dysfunction of thoracic region: Secondary | ICD-10-CM | POA: Diagnosis not present

## 2015-01-09 DIAGNOSIS — M5413 Radiculopathy, cervicothoracic region: Secondary | ICD-10-CM | POA: Diagnosis not present

## 2015-01-14 DIAGNOSIS — M545 Low back pain: Secondary | ICD-10-CM | POA: Diagnosis not present

## 2015-01-14 DIAGNOSIS — M9901 Segmental and somatic dysfunction of cervical region: Secondary | ICD-10-CM | POA: Diagnosis not present

## 2015-01-14 DIAGNOSIS — M542 Cervicalgia: Secondary | ICD-10-CM | POA: Diagnosis not present

## 2015-01-14 DIAGNOSIS — M9903 Segmental and somatic dysfunction of lumbar region: Secondary | ICD-10-CM | POA: Diagnosis not present

## 2015-01-14 DIAGNOSIS — M9902 Segmental and somatic dysfunction of thoracic region: Secondary | ICD-10-CM | POA: Diagnosis not present

## 2015-01-16 DIAGNOSIS — M545 Low back pain: Secondary | ICD-10-CM | POA: Diagnosis not present

## 2015-01-16 DIAGNOSIS — M9901 Segmental and somatic dysfunction of cervical region: Secondary | ICD-10-CM | POA: Diagnosis not present

## 2015-01-16 DIAGNOSIS — M9902 Segmental and somatic dysfunction of thoracic region: Secondary | ICD-10-CM | POA: Diagnosis not present

## 2015-01-16 DIAGNOSIS — M542 Cervicalgia: Secondary | ICD-10-CM | POA: Diagnosis not present

## 2015-01-16 DIAGNOSIS — M9903 Segmental and somatic dysfunction of lumbar region: Secondary | ICD-10-CM | POA: Diagnosis not present

## 2015-01-21 DIAGNOSIS — M9902 Segmental and somatic dysfunction of thoracic region: Secondary | ICD-10-CM | POA: Diagnosis not present

## 2015-01-21 DIAGNOSIS — M9903 Segmental and somatic dysfunction of lumbar region: Secondary | ICD-10-CM | POA: Diagnosis not present

## 2015-01-21 DIAGNOSIS — M542 Cervicalgia: Secondary | ICD-10-CM | POA: Diagnosis not present

## 2015-01-21 DIAGNOSIS — M545 Low back pain: Secondary | ICD-10-CM | POA: Diagnosis not present

## 2015-01-21 DIAGNOSIS — M9901 Segmental and somatic dysfunction of cervical region: Secondary | ICD-10-CM | POA: Diagnosis not present

## 2015-01-28 DIAGNOSIS — M545 Low back pain: Secondary | ICD-10-CM | POA: Diagnosis not present

## 2015-01-28 DIAGNOSIS — M9903 Segmental and somatic dysfunction of lumbar region: Secondary | ICD-10-CM | POA: Diagnosis not present

## 2015-01-28 DIAGNOSIS — M542 Cervicalgia: Secondary | ICD-10-CM | POA: Diagnosis not present

## 2015-01-28 DIAGNOSIS — M9901 Segmental and somatic dysfunction of cervical region: Secondary | ICD-10-CM | POA: Diagnosis not present

## 2015-01-28 DIAGNOSIS — M9902 Segmental and somatic dysfunction of thoracic region: Secondary | ICD-10-CM | POA: Diagnosis not present

## 2015-02-01 DIAGNOSIS — M25562 Pain in left knee: Secondary | ICD-10-CM | POA: Diagnosis not present

## 2015-02-07 DIAGNOSIS — M170 Bilateral primary osteoarthritis of knee: Secondary | ICD-10-CM | POA: Diagnosis not present

## 2015-02-07 DIAGNOSIS — M25562 Pain in left knee: Secondary | ICD-10-CM | POA: Diagnosis not present

## 2015-02-07 DIAGNOSIS — M25561 Pain in right knee: Secondary | ICD-10-CM | POA: Diagnosis not present

## 2015-02-13 DIAGNOSIS — M25561 Pain in right knee: Secondary | ICD-10-CM | POA: Diagnosis not present

## 2015-02-13 DIAGNOSIS — M1712 Unilateral primary osteoarthritis, left knee: Secondary | ICD-10-CM | POA: Diagnosis not present

## 2015-02-13 DIAGNOSIS — M170 Bilateral primary osteoarthritis of knee: Secondary | ICD-10-CM | POA: Diagnosis not present

## 2015-02-13 DIAGNOSIS — M25562 Pain in left knee: Secondary | ICD-10-CM | POA: Diagnosis not present

## 2015-02-13 DIAGNOSIS — R2689 Other abnormalities of gait and mobility: Secondary | ICD-10-CM | POA: Diagnosis not present

## 2015-02-14 DIAGNOSIS — M1711 Unilateral primary osteoarthritis, right knee: Secondary | ICD-10-CM | POA: Diagnosis not present

## 2015-02-14 DIAGNOSIS — M25562 Pain in left knee: Secondary | ICD-10-CM | POA: Diagnosis not present

## 2015-02-14 DIAGNOSIS — M170 Bilateral primary osteoarthritis of knee: Secondary | ICD-10-CM | POA: Diagnosis not present

## 2015-02-14 DIAGNOSIS — M25561 Pain in right knee: Secondary | ICD-10-CM | POA: Diagnosis not present

## 2015-02-14 DIAGNOSIS — R2689 Other abnormalities of gait and mobility: Secondary | ICD-10-CM | POA: Diagnosis not present

## 2015-02-24 DIAGNOSIS — M25562 Pain in left knee: Secondary | ICD-10-CM | POA: Diagnosis not present

## 2015-02-24 DIAGNOSIS — M1712 Unilateral primary osteoarthritis, left knee: Secondary | ICD-10-CM | POA: Diagnosis not present

## 2015-02-27 DIAGNOSIS — R2689 Other abnormalities of gait and mobility: Secondary | ICD-10-CM | POA: Diagnosis not present

## 2015-02-27 DIAGNOSIS — M1711 Unilateral primary osteoarthritis, right knee: Secondary | ICD-10-CM | POA: Diagnosis not present

## 2015-02-27 DIAGNOSIS — M25561 Pain in right knee: Secondary | ICD-10-CM | POA: Diagnosis not present

## 2015-02-27 DIAGNOSIS — M25562 Pain in left knee: Secondary | ICD-10-CM | POA: Diagnosis not present

## 2015-02-27 DIAGNOSIS — M170 Bilateral primary osteoarthritis of knee: Secondary | ICD-10-CM | POA: Diagnosis not present

## 2015-02-28 DIAGNOSIS — M25561 Pain in right knee: Secondary | ICD-10-CM | POA: Diagnosis not present

## 2015-02-28 DIAGNOSIS — R2689 Other abnormalities of gait and mobility: Secondary | ICD-10-CM | POA: Diagnosis not present

## 2015-02-28 DIAGNOSIS — M25562 Pain in left knee: Secondary | ICD-10-CM | POA: Diagnosis not present

## 2015-02-28 DIAGNOSIS — M1712 Unilateral primary osteoarthritis, left knee: Secondary | ICD-10-CM | POA: Diagnosis not present

## 2015-02-28 DIAGNOSIS — M170 Bilateral primary osteoarthritis of knee: Secondary | ICD-10-CM | POA: Diagnosis not present

## 2015-03-06 DIAGNOSIS — M1711 Unilateral primary osteoarthritis, right knee: Secondary | ICD-10-CM | POA: Diagnosis not present

## 2015-03-06 DIAGNOSIS — M25562 Pain in left knee: Secondary | ICD-10-CM | POA: Diagnosis not present

## 2015-03-06 DIAGNOSIS — R2689 Other abnormalities of gait and mobility: Secondary | ICD-10-CM | POA: Diagnosis not present

## 2015-03-06 DIAGNOSIS — M25561 Pain in right knee: Secondary | ICD-10-CM | POA: Diagnosis not present

## 2015-03-06 DIAGNOSIS — M170 Bilateral primary osteoarthritis of knee: Secondary | ICD-10-CM | POA: Diagnosis not present

## 2015-03-13 DIAGNOSIS — M1712 Unilateral primary osteoarthritis, left knee: Secondary | ICD-10-CM | POA: Diagnosis not present

## 2015-03-13 DIAGNOSIS — M25562 Pain in left knee: Secondary | ICD-10-CM | POA: Diagnosis not present

## 2015-03-25 DIAGNOSIS — E7800 Pure hypercholesterolemia, unspecified: Secondary | ICD-10-CM | POA: Diagnosis not present

## 2015-03-25 DIAGNOSIS — R739 Hyperglycemia, unspecified: Secondary | ICD-10-CM | POA: Diagnosis not present

## 2015-04-07 DIAGNOSIS — M25562 Pain in left knee: Secondary | ICD-10-CM | POA: Diagnosis not present

## 2015-04-07 DIAGNOSIS — M170 Bilateral primary osteoarthritis of knee: Secondary | ICD-10-CM | POA: Diagnosis not present

## 2015-04-07 DIAGNOSIS — M25561 Pain in right knee: Secondary | ICD-10-CM | POA: Diagnosis not present

## 2015-04-21 DIAGNOSIS — K429 Umbilical hernia without obstruction or gangrene: Secondary | ICD-10-CM | POA: Diagnosis not present

## 2015-04-21 DIAGNOSIS — M6200 Separation of muscle (nontraumatic), unspecified site: Secondary | ICD-10-CM | POA: Diagnosis not present

## 2015-04-21 DIAGNOSIS — I10 Essential (primary) hypertension: Secondary | ICD-10-CM | POA: Diagnosis not present

## 2015-04-21 DIAGNOSIS — M1990 Unspecified osteoarthritis, unspecified site: Secondary | ICD-10-CM | POA: Diagnosis not present

## 2015-07-14 DIAGNOSIS — M25561 Pain in right knee: Secondary | ICD-10-CM | POA: Diagnosis not present

## 2015-07-14 DIAGNOSIS — M170 Bilateral primary osteoarthritis of knee: Secondary | ICD-10-CM | POA: Diagnosis not present

## 2015-07-14 DIAGNOSIS — M25562 Pain in left knee: Secondary | ICD-10-CM | POA: Diagnosis not present

## 2015-07-21 DIAGNOSIS — K219 Gastro-esophageal reflux disease without esophagitis: Secondary | ICD-10-CM | POA: Diagnosis not present

## 2015-07-21 DIAGNOSIS — Z86010 Personal history of colonic polyps: Secondary | ICD-10-CM | POA: Diagnosis not present

## 2015-07-21 DIAGNOSIS — Z01818 Encounter for other preprocedural examination: Secondary | ICD-10-CM | POA: Diagnosis not present

## 2015-07-29 DIAGNOSIS — K644 Residual hemorrhoidal skin tags: Secondary | ICD-10-CM | POA: Diagnosis not present

## 2015-07-29 DIAGNOSIS — K648 Other hemorrhoids: Secondary | ICD-10-CM | POA: Diagnosis not present

## 2015-07-29 DIAGNOSIS — Z79899 Other long term (current) drug therapy: Secondary | ICD-10-CM | POA: Diagnosis not present

## 2015-07-29 DIAGNOSIS — F329 Major depressive disorder, single episode, unspecified: Secondary | ICD-10-CM | POA: Diagnosis not present

## 2015-07-29 DIAGNOSIS — I10 Essential (primary) hypertension: Secondary | ICD-10-CM | POA: Diagnosis not present

## 2015-07-29 DIAGNOSIS — Z1211 Encounter for screening for malignant neoplasm of colon: Secondary | ICD-10-CM | POA: Diagnosis not present

## 2015-07-29 DIAGNOSIS — Z87891 Personal history of nicotine dependence: Secondary | ICD-10-CM | POA: Diagnosis not present

## 2015-07-29 DIAGNOSIS — K5730 Diverticulosis of large intestine without perforation or abscess without bleeding: Secondary | ICD-10-CM | POA: Diagnosis not present

## 2015-07-31 DIAGNOSIS — I10 Essential (primary) hypertension: Secondary | ICD-10-CM | POA: Diagnosis not present

## 2015-07-31 DIAGNOSIS — E119 Type 2 diabetes mellitus without complications: Secondary | ICD-10-CM | POA: Diagnosis not present

## 2015-07-31 DIAGNOSIS — E785 Hyperlipidemia, unspecified: Secondary | ICD-10-CM | POA: Diagnosis not present

## 2015-07-31 DIAGNOSIS — Z0000 Encounter for general adult medical examination without abnormal findings: Secondary | ICD-10-CM | POA: Diagnosis not present

## 2015-07-31 DIAGNOSIS — M1990 Unspecified osteoarthritis, unspecified site: Secondary | ICD-10-CM | POA: Diagnosis not present

## 2015-07-31 DIAGNOSIS — E7800 Pure hypercholesterolemia, unspecified: Secondary | ICD-10-CM | POA: Diagnosis not present

## 2015-07-31 DIAGNOSIS — K219 Gastro-esophageal reflux disease without esophagitis: Secondary | ICD-10-CM | POA: Diagnosis not present

## 2015-08-12 DIAGNOSIS — H25813 Combined forms of age-related cataract, bilateral: Secondary | ICD-10-CM | POA: Diagnosis not present

## 2015-08-12 DIAGNOSIS — H524 Presbyopia: Secondary | ICD-10-CM | POA: Diagnosis not present

## 2015-09-10 DIAGNOSIS — M170 Bilateral primary osteoarthritis of knee: Secondary | ICD-10-CM | POA: Diagnosis not present

## 2015-09-10 DIAGNOSIS — M25561 Pain in right knee: Secondary | ICD-10-CM | POA: Diagnosis not present

## 2015-09-10 DIAGNOSIS — M2550 Pain in unspecified joint: Secondary | ICD-10-CM | POA: Diagnosis not present

## 2015-09-10 DIAGNOSIS — R5382 Chronic fatigue, unspecified: Secondary | ICD-10-CM | POA: Diagnosis not present

## 2015-09-10 DIAGNOSIS — R262 Difficulty in walking, not elsewhere classified: Secondary | ICD-10-CM | POA: Diagnosis not present

## 2015-09-10 DIAGNOSIS — M7989 Other specified soft tissue disorders: Secondary | ICD-10-CM | POA: Diagnosis not present

## 2015-09-10 DIAGNOSIS — M25562 Pain in left knee: Secondary | ICD-10-CM | POA: Diagnosis not present

## 2015-09-16 DIAGNOSIS — R5382 Chronic fatigue, unspecified: Secondary | ICD-10-CM | POA: Diagnosis not present

## 2015-09-16 DIAGNOSIS — M0609 Rheumatoid arthritis without rheumatoid factor, multiple sites: Secondary | ICD-10-CM | POA: Diagnosis not present

## 2015-09-16 DIAGNOSIS — M2550 Pain in unspecified joint: Secondary | ICD-10-CM | POA: Diagnosis not present

## 2015-09-16 DIAGNOSIS — M7989 Other specified soft tissue disorders: Secondary | ICD-10-CM | POA: Diagnosis not present

## 2015-10-27 DIAGNOSIS — Z79899 Other long term (current) drug therapy: Secondary | ICD-10-CM | POA: Diagnosis not present

## 2015-10-27 DIAGNOSIS — M2550 Pain in unspecified joint: Secondary | ICD-10-CM | POA: Diagnosis not present

## 2015-10-27 DIAGNOSIS — M0609 Rheumatoid arthritis without rheumatoid factor, multiple sites: Secondary | ICD-10-CM | POA: Diagnosis not present

## 2015-11-04 DIAGNOSIS — E8881 Metabolic syndrome: Secondary | ICD-10-CM | POA: Diagnosis not present

## 2015-11-04 DIAGNOSIS — E119 Type 2 diabetes mellitus without complications: Secondary | ICD-10-CM | POA: Diagnosis not present

## 2015-11-04 DIAGNOSIS — Z23 Encounter for immunization: Secondary | ICD-10-CM | POA: Diagnosis not present

## 2015-11-04 DIAGNOSIS — F988 Other specified behavioral and emotional disorders with onset usually occurring in childhood and adolescence: Secondary | ICD-10-CM | POA: Diagnosis not present

## 2015-11-04 DIAGNOSIS — I10 Essential (primary) hypertension: Secondary | ICD-10-CM | POA: Diagnosis not present

## 2015-11-04 DIAGNOSIS — H6122 Impacted cerumen, left ear: Secondary | ICD-10-CM | POA: Diagnosis not present

## 2015-11-04 DIAGNOSIS — Z1389 Encounter for screening for other disorder: Secondary | ICD-10-CM | POA: Diagnosis not present

## 2015-11-12 DIAGNOSIS — T161XXA Foreign body in right ear, initial encounter: Secondary | ICD-10-CM | POA: Diagnosis not present

## 2015-11-12 DIAGNOSIS — H9312 Tinnitus, left ear: Secondary | ICD-10-CM | POA: Diagnosis not present

## 2015-11-12 DIAGNOSIS — T169XXA Foreign body in ear, unspecified ear, initial encounter: Secondary | ICD-10-CM

## 2015-11-12 DIAGNOSIS — T162XXA Foreign body in left ear, initial encounter: Secondary | ICD-10-CM | POA: Diagnosis not present

## 2015-11-12 DIAGNOSIS — H903 Sensorineural hearing loss, bilateral: Secondary | ICD-10-CM | POA: Diagnosis not present

## 2015-11-12 HISTORY — DX: Foreign body in ear, unspecified ear, initial encounter: T16.9XXA

## 2015-11-18 DIAGNOSIS — M25562 Pain in left knee: Secondary | ICD-10-CM | POA: Diagnosis not present

## 2015-11-18 DIAGNOSIS — M1712 Unilateral primary osteoarthritis, left knee: Secondary | ICD-10-CM | POA: Diagnosis not present

## 2015-12-11 DIAGNOSIS — L820 Inflamed seborrheic keratosis: Secondary | ICD-10-CM | POA: Diagnosis not present

## 2015-12-31 DIAGNOSIS — M2550 Pain in unspecified joint: Secondary | ICD-10-CM | POA: Diagnosis not present

## 2015-12-31 DIAGNOSIS — M0609 Rheumatoid arthritis without rheumatoid factor, multiple sites: Secondary | ICD-10-CM | POA: Diagnosis not present

## 2015-12-31 DIAGNOSIS — Z79899 Other long term (current) drug therapy: Secondary | ICD-10-CM | POA: Diagnosis not present

## 2015-12-31 DIAGNOSIS — M25562 Pain in left knee: Secondary | ICD-10-CM | POA: Diagnosis not present

## 2016-02-02 DIAGNOSIS — M0609 Rheumatoid arthritis without rheumatoid factor, multiple sites: Secondary | ICD-10-CM | POA: Diagnosis not present

## 2016-03-16 DIAGNOSIS — E785 Hyperlipidemia, unspecified: Secondary | ICD-10-CM | POA: Diagnosis not present

## 2016-03-16 DIAGNOSIS — E8881 Metabolic syndrome: Secondary | ICD-10-CM | POA: Diagnosis not present

## 2016-03-16 DIAGNOSIS — Z79899 Other long term (current) drug therapy: Secondary | ICD-10-CM | POA: Diagnosis not present

## 2016-03-16 DIAGNOSIS — E119 Type 2 diabetes mellitus without complications: Secondary | ICD-10-CM | POA: Diagnosis not present

## 2016-04-12 DIAGNOSIS — E669 Obesity, unspecified: Secondary | ICD-10-CM | POA: Diagnosis not present

## 2016-04-12 DIAGNOSIS — Z79899 Other long term (current) drug therapy: Secondary | ICD-10-CM | POA: Diagnosis not present

## 2016-04-12 DIAGNOSIS — Z6831 Body mass index (BMI) 31.0-31.9, adult: Secondary | ICD-10-CM | POA: Diagnosis not present

## 2016-04-12 DIAGNOSIS — M2550 Pain in unspecified joint: Secondary | ICD-10-CM | POA: Diagnosis not present

## 2016-04-12 DIAGNOSIS — M25562 Pain in left knee: Secondary | ICD-10-CM | POA: Diagnosis not present

## 2016-04-12 DIAGNOSIS — M0609 Rheumatoid arthritis without rheumatoid factor, multiple sites: Secondary | ICD-10-CM | POA: Diagnosis not present

## 2016-04-20 DIAGNOSIS — M9905 Segmental and somatic dysfunction of pelvic region: Secondary | ICD-10-CM | POA: Diagnosis not present

## 2016-04-20 DIAGNOSIS — M9902 Segmental and somatic dysfunction of thoracic region: Secondary | ICD-10-CM | POA: Diagnosis not present

## 2016-04-20 DIAGNOSIS — M9903 Segmental and somatic dysfunction of lumbar region: Secondary | ICD-10-CM | POA: Diagnosis not present

## 2016-04-20 DIAGNOSIS — M62830 Muscle spasm of back: Secondary | ICD-10-CM | POA: Diagnosis not present

## 2016-04-20 DIAGNOSIS — M542 Cervicalgia: Secondary | ICD-10-CM | POA: Diagnosis not present

## 2016-04-20 DIAGNOSIS — M9901 Segmental and somatic dysfunction of cervical region: Secondary | ICD-10-CM | POA: Diagnosis not present

## 2016-04-22 DIAGNOSIS — M9902 Segmental and somatic dysfunction of thoracic region: Secondary | ICD-10-CM | POA: Diagnosis not present

## 2016-04-22 DIAGNOSIS — M62830 Muscle spasm of back: Secondary | ICD-10-CM | POA: Diagnosis not present

## 2016-04-22 DIAGNOSIS — M9903 Segmental and somatic dysfunction of lumbar region: Secondary | ICD-10-CM | POA: Diagnosis not present

## 2016-04-22 DIAGNOSIS — M9901 Segmental and somatic dysfunction of cervical region: Secondary | ICD-10-CM | POA: Diagnosis not present

## 2016-04-22 DIAGNOSIS — M542 Cervicalgia: Secondary | ICD-10-CM | POA: Diagnosis not present

## 2016-04-22 DIAGNOSIS — M9905 Segmental and somatic dysfunction of pelvic region: Secondary | ICD-10-CM | POA: Diagnosis not present

## 2016-04-27 DIAGNOSIS — M62830 Muscle spasm of back: Secondary | ICD-10-CM | POA: Diagnosis not present

## 2016-04-27 DIAGNOSIS — M9903 Segmental and somatic dysfunction of lumbar region: Secondary | ICD-10-CM | POA: Diagnosis not present

## 2016-04-27 DIAGNOSIS — M9905 Segmental and somatic dysfunction of pelvic region: Secondary | ICD-10-CM | POA: Diagnosis not present

## 2016-04-27 DIAGNOSIS — M9901 Segmental and somatic dysfunction of cervical region: Secondary | ICD-10-CM | POA: Diagnosis not present

## 2016-04-27 DIAGNOSIS — M542 Cervicalgia: Secondary | ICD-10-CM | POA: Diagnosis not present

## 2016-04-27 DIAGNOSIS — M9902 Segmental and somatic dysfunction of thoracic region: Secondary | ICD-10-CM | POA: Diagnosis not present

## 2016-05-04 DIAGNOSIS — M9901 Segmental and somatic dysfunction of cervical region: Secondary | ICD-10-CM | POA: Diagnosis not present

## 2016-05-04 DIAGNOSIS — M62830 Muscle spasm of back: Secondary | ICD-10-CM | POA: Diagnosis not present

## 2016-05-04 DIAGNOSIS — M542 Cervicalgia: Secondary | ICD-10-CM | POA: Diagnosis not present

## 2016-05-04 DIAGNOSIS — M9903 Segmental and somatic dysfunction of lumbar region: Secondary | ICD-10-CM | POA: Diagnosis not present

## 2016-05-04 DIAGNOSIS — M9902 Segmental and somatic dysfunction of thoracic region: Secondary | ICD-10-CM | POA: Diagnosis not present

## 2016-05-04 DIAGNOSIS — M9905 Segmental and somatic dysfunction of pelvic region: Secondary | ICD-10-CM | POA: Diagnosis not present

## 2016-05-11 DIAGNOSIS — M9902 Segmental and somatic dysfunction of thoracic region: Secondary | ICD-10-CM | POA: Diagnosis not present

## 2016-05-11 DIAGNOSIS — M542 Cervicalgia: Secondary | ICD-10-CM | POA: Diagnosis not present

## 2016-05-11 DIAGNOSIS — M9901 Segmental and somatic dysfunction of cervical region: Secondary | ICD-10-CM | POA: Diagnosis not present

## 2016-05-11 DIAGNOSIS — M62830 Muscle spasm of back: Secondary | ICD-10-CM | POA: Diagnosis not present

## 2016-05-11 DIAGNOSIS — M9905 Segmental and somatic dysfunction of pelvic region: Secondary | ICD-10-CM | POA: Diagnosis not present

## 2016-05-11 DIAGNOSIS — M9903 Segmental and somatic dysfunction of lumbar region: Secondary | ICD-10-CM | POA: Diagnosis not present

## 2016-05-17 DIAGNOSIS — I10 Essential (primary) hypertension: Secondary | ICD-10-CM | POA: Diagnosis not present

## 2016-05-17 DIAGNOSIS — F329 Major depressive disorder, single episode, unspecified: Secondary | ICD-10-CM | POA: Diagnosis not present

## 2016-05-18 DIAGNOSIS — M9903 Segmental and somatic dysfunction of lumbar region: Secondary | ICD-10-CM | POA: Diagnosis not present

## 2016-05-18 DIAGNOSIS — M62830 Muscle spasm of back: Secondary | ICD-10-CM | POA: Diagnosis not present

## 2016-05-18 DIAGNOSIS — M9902 Segmental and somatic dysfunction of thoracic region: Secondary | ICD-10-CM | POA: Diagnosis not present

## 2016-05-18 DIAGNOSIS — M542 Cervicalgia: Secondary | ICD-10-CM | POA: Diagnosis not present

## 2016-05-18 DIAGNOSIS — M9905 Segmental and somatic dysfunction of pelvic region: Secondary | ICD-10-CM | POA: Diagnosis not present

## 2016-05-18 DIAGNOSIS — M9901 Segmental and somatic dysfunction of cervical region: Secondary | ICD-10-CM | POA: Diagnosis not present

## 2016-06-01 DIAGNOSIS — M9905 Segmental and somatic dysfunction of pelvic region: Secondary | ICD-10-CM | POA: Diagnosis not present

## 2016-06-01 DIAGNOSIS — M542 Cervicalgia: Secondary | ICD-10-CM | POA: Diagnosis not present

## 2016-06-01 DIAGNOSIS — M9902 Segmental and somatic dysfunction of thoracic region: Secondary | ICD-10-CM | POA: Diagnosis not present

## 2016-06-01 DIAGNOSIS — M9903 Segmental and somatic dysfunction of lumbar region: Secondary | ICD-10-CM | POA: Diagnosis not present

## 2016-06-01 DIAGNOSIS — M62830 Muscle spasm of back: Secondary | ICD-10-CM | POA: Diagnosis not present

## 2016-06-01 DIAGNOSIS — H25813 Combined forms of age-related cataract, bilateral: Secondary | ICD-10-CM | POA: Diagnosis not present

## 2016-06-01 DIAGNOSIS — M9901 Segmental and somatic dysfunction of cervical region: Secondary | ICD-10-CM | POA: Diagnosis not present

## 2016-06-01 DIAGNOSIS — H11153 Pinguecula, bilateral: Secondary | ICD-10-CM | POA: Diagnosis not present

## 2016-06-28 DIAGNOSIS — F329 Major depressive disorder, single episode, unspecified: Secondary | ICD-10-CM | POA: Diagnosis not present

## 2016-06-28 DIAGNOSIS — I10 Essential (primary) hypertension: Secondary | ICD-10-CM | POA: Diagnosis not present

## 2016-06-28 DIAGNOSIS — Z6832 Body mass index (BMI) 32.0-32.9, adult: Secondary | ICD-10-CM | POA: Diagnosis not present

## 2016-06-28 DIAGNOSIS — E119 Type 2 diabetes mellitus without complications: Secondary | ICD-10-CM | POA: Diagnosis not present

## 2016-06-28 DIAGNOSIS — E8881 Metabolic syndrome: Secondary | ICD-10-CM | POA: Diagnosis not present

## 2016-06-28 DIAGNOSIS — M25511 Pain in right shoulder: Secondary | ICD-10-CM | POA: Diagnosis not present

## 2016-07-06 DIAGNOSIS — M0609 Rheumatoid arthritis without rheumatoid factor, multiple sites: Secondary | ICD-10-CM | POA: Diagnosis not present

## 2016-07-06 DIAGNOSIS — M2550 Pain in unspecified joint: Secondary | ICD-10-CM | POA: Diagnosis not present

## 2016-07-06 DIAGNOSIS — E669 Obesity, unspecified: Secondary | ICD-10-CM | POA: Diagnosis not present

## 2016-07-06 DIAGNOSIS — Z6830 Body mass index (BMI) 30.0-30.9, adult: Secondary | ICD-10-CM | POA: Diagnosis not present

## 2016-07-06 DIAGNOSIS — M25511 Pain in right shoulder: Secondary | ICD-10-CM | POA: Diagnosis not present

## 2016-07-06 DIAGNOSIS — Z79899 Other long term (current) drug therapy: Secondary | ICD-10-CM | POA: Diagnosis not present

## 2016-07-08 DIAGNOSIS — Z6832 Body mass index (BMI) 32.0-32.9, adult: Secondary | ICD-10-CM | POA: Diagnosis not present

## 2016-07-08 DIAGNOSIS — L255 Unspecified contact dermatitis due to plants, except food: Secondary | ICD-10-CM | POA: Diagnosis not present

## 2016-07-28 DIAGNOSIS — S81802A Unspecified open wound, left lower leg, initial encounter: Secondary | ICD-10-CM | POA: Diagnosis not present

## 2016-07-28 DIAGNOSIS — L089 Local infection of the skin and subcutaneous tissue, unspecified: Secondary | ICD-10-CM | POA: Diagnosis not present

## 2016-07-28 DIAGNOSIS — Z6832 Body mass index (BMI) 32.0-32.9, adult: Secondary | ICD-10-CM | POA: Diagnosis not present

## 2016-08-14 DIAGNOSIS — M79604 Pain in right leg: Secondary | ICD-10-CM | POA: Diagnosis not present

## 2016-08-23 DIAGNOSIS — M0609 Rheumatoid arthritis without rheumatoid factor, multiple sites: Secondary | ICD-10-CM | POA: Diagnosis not present

## 2016-09-20 DIAGNOSIS — Z79899 Other long term (current) drug therapy: Secondary | ICD-10-CM | POA: Diagnosis not present

## 2016-09-20 DIAGNOSIS — M0609 Rheumatoid arthritis without rheumatoid factor, multiple sites: Secondary | ICD-10-CM | POA: Diagnosis not present

## 2016-10-26 DIAGNOSIS — E669 Obesity, unspecified: Secondary | ICD-10-CM | POA: Diagnosis not present

## 2016-10-26 DIAGNOSIS — M25511 Pain in right shoulder: Secondary | ICD-10-CM | POA: Diagnosis not present

## 2016-10-26 DIAGNOSIS — M791 Myalgia: Secondary | ICD-10-CM | POA: Diagnosis not present

## 2016-10-26 DIAGNOSIS — Z79899 Other long term (current) drug therapy: Secondary | ICD-10-CM | POA: Diagnosis not present

## 2016-10-26 DIAGNOSIS — M2550 Pain in unspecified joint: Secondary | ICD-10-CM | POA: Diagnosis not present

## 2016-10-26 DIAGNOSIS — M0609 Rheumatoid arthritis without rheumatoid factor, multiple sites: Secondary | ICD-10-CM | POA: Diagnosis not present

## 2016-10-26 DIAGNOSIS — Z6832 Body mass index (BMI) 32.0-32.9, adult: Secondary | ICD-10-CM | POA: Diagnosis not present

## 2016-11-22 DIAGNOSIS — M0609 Rheumatoid arthritis without rheumatoid factor, multiple sites: Secondary | ICD-10-CM | POA: Diagnosis not present

## 2016-11-22 DIAGNOSIS — Z79899 Other long term (current) drug therapy: Secondary | ICD-10-CM | POA: Diagnosis not present

## 2016-12-28 DIAGNOSIS — M7062 Trochanteric bursitis, left hip: Secondary | ICD-10-CM | POA: Diagnosis not present

## 2017-01-10 DIAGNOSIS — Z23 Encounter for immunization: Secondary | ICD-10-CM | POA: Diagnosis not present

## 2017-01-24 DIAGNOSIS — M0609 Rheumatoid arthritis without rheumatoid factor, multiple sites: Secondary | ICD-10-CM | POA: Diagnosis not present

## 2017-01-24 DIAGNOSIS — Z79899 Other long term (current) drug therapy: Secondary | ICD-10-CM | POA: Diagnosis not present

## 2017-01-26 DIAGNOSIS — Z6832 Body mass index (BMI) 32.0-32.9, adult: Secondary | ICD-10-CM | POA: Diagnosis not present

## 2017-01-26 DIAGNOSIS — M2550 Pain in unspecified joint: Secondary | ICD-10-CM | POA: Diagnosis not present

## 2017-01-26 DIAGNOSIS — M0609 Rheumatoid arthritis without rheumatoid factor, multiple sites: Secondary | ICD-10-CM | POA: Diagnosis not present

## 2017-01-26 DIAGNOSIS — E669 Obesity, unspecified: Secondary | ICD-10-CM | POA: Diagnosis not present

## 2017-01-26 DIAGNOSIS — M7062 Trochanteric bursitis, left hip: Secondary | ICD-10-CM | POA: Diagnosis not present

## 2017-01-26 DIAGNOSIS — Z79899 Other long term (current) drug therapy: Secondary | ICD-10-CM | POA: Diagnosis not present

## 2017-02-10 DIAGNOSIS — Z79899 Other long term (current) drug therapy: Secondary | ICD-10-CM | POA: Diagnosis not present

## 2017-02-10 DIAGNOSIS — M7062 Trochanteric bursitis, left hip: Secondary | ICD-10-CM | POA: Diagnosis not present

## 2017-02-10 DIAGNOSIS — M2550 Pain in unspecified joint: Secondary | ICD-10-CM | POA: Diagnosis not present

## 2017-02-10 DIAGNOSIS — E669 Obesity, unspecified: Secondary | ICD-10-CM | POA: Diagnosis not present

## 2017-02-10 DIAGNOSIS — M0609 Rheumatoid arthritis without rheumatoid factor, multiple sites: Secondary | ICD-10-CM | POA: Diagnosis not present

## 2017-02-10 DIAGNOSIS — Z6831 Body mass index (BMI) 31.0-31.9, adult: Secondary | ICD-10-CM | POA: Diagnosis not present

## 2017-03-21 DIAGNOSIS — Z79899 Other long term (current) drug therapy: Secondary | ICD-10-CM | POA: Diagnosis not present

## 2017-03-21 DIAGNOSIS — M0609 Rheumatoid arthritis without rheumatoid factor, multiple sites: Secondary | ICD-10-CM | POA: Diagnosis not present

## 2017-03-29 DIAGNOSIS — E119 Type 2 diabetes mellitus without complications: Secondary | ICD-10-CM | POA: Diagnosis not present

## 2017-03-29 DIAGNOSIS — E8881 Metabolic syndrome: Secondary | ICD-10-CM | POA: Diagnosis not present

## 2017-03-29 DIAGNOSIS — E785 Hyperlipidemia, unspecified: Secondary | ICD-10-CM | POA: Diagnosis not present

## 2017-03-29 DIAGNOSIS — F988 Other specified behavioral and emotional disorders with onset usually occurring in childhood and adolescence: Secondary | ICD-10-CM | POA: Diagnosis not present

## 2017-03-29 DIAGNOSIS — Z0000 Encounter for general adult medical examination without abnormal findings: Secondary | ICD-10-CM | POA: Diagnosis not present

## 2017-03-29 DIAGNOSIS — Z79899 Other long term (current) drug therapy: Secondary | ICD-10-CM | POA: Diagnosis not present

## 2017-04-28 DIAGNOSIS — M7062 Trochanteric bursitis, left hip: Secondary | ICD-10-CM | POA: Diagnosis not present

## 2017-04-28 DIAGNOSIS — Z6832 Body mass index (BMI) 32.0-32.9, adult: Secondary | ICD-10-CM | POA: Diagnosis not present

## 2017-04-28 DIAGNOSIS — M0609 Rheumatoid arthritis without rheumatoid factor, multiple sites: Secondary | ICD-10-CM | POA: Diagnosis not present

## 2017-04-28 DIAGNOSIS — M2550 Pain in unspecified joint: Secondary | ICD-10-CM | POA: Diagnosis not present

## 2017-04-28 DIAGNOSIS — E669 Obesity, unspecified: Secondary | ICD-10-CM | POA: Diagnosis not present

## 2017-04-28 DIAGNOSIS — Z79899 Other long term (current) drug therapy: Secondary | ICD-10-CM | POA: Diagnosis not present

## 2017-05-16 DIAGNOSIS — M0609 Rheumatoid arthritis without rheumatoid factor, multiple sites: Secondary | ICD-10-CM | POA: Diagnosis not present

## 2017-07-12 DIAGNOSIS — Z79899 Other long term (current) drug therapy: Secondary | ICD-10-CM | POA: Diagnosis not present

## 2017-07-12 DIAGNOSIS — M0609 Rheumatoid arthritis without rheumatoid factor, multiple sites: Secondary | ICD-10-CM | POA: Diagnosis not present

## 2017-07-26 DIAGNOSIS — R5382 Chronic fatigue, unspecified: Secondary | ICD-10-CM | POA: Diagnosis not present

## 2017-07-26 DIAGNOSIS — M2550 Pain in unspecified joint: Secondary | ICD-10-CM | POA: Diagnosis not present

## 2017-07-26 DIAGNOSIS — E669 Obesity, unspecified: Secondary | ICD-10-CM | POA: Diagnosis not present

## 2017-07-26 DIAGNOSIS — R202 Paresthesia of skin: Secondary | ICD-10-CM | POA: Diagnosis not present

## 2017-07-26 DIAGNOSIS — Z79899 Other long term (current) drug therapy: Secondary | ICD-10-CM | POA: Diagnosis not present

## 2017-07-26 DIAGNOSIS — R071 Chest pain on breathing: Secondary | ICD-10-CM | POA: Diagnosis not present

## 2017-07-26 DIAGNOSIS — M0609 Rheumatoid arthritis without rheumatoid factor, multiple sites: Secondary | ICD-10-CM | POA: Diagnosis not present

## 2017-07-26 DIAGNOSIS — Z6832 Body mass index (BMI) 32.0-32.9, adult: Secondary | ICD-10-CM | POA: Diagnosis not present

## 2017-09-06 DIAGNOSIS — M0609 Rheumatoid arthritis without rheumatoid factor, multiple sites: Secondary | ICD-10-CM | POA: Diagnosis not present

## 2017-09-06 DIAGNOSIS — Z79899 Other long term (current) drug therapy: Secondary | ICD-10-CM | POA: Diagnosis not present

## 2017-09-14 DIAGNOSIS — Z6832 Body mass index (BMI) 32.0-32.9, adult: Secondary | ICD-10-CM | POA: Diagnosis not present

## 2017-09-14 DIAGNOSIS — R071 Chest pain on breathing: Secondary | ICD-10-CM | POA: Diagnosis not present

## 2017-09-14 DIAGNOSIS — M0609 Rheumatoid arthritis without rheumatoid factor, multiple sites: Secondary | ICD-10-CM | POA: Diagnosis not present

## 2017-09-14 DIAGNOSIS — R202 Paresthesia of skin: Secondary | ICD-10-CM | POA: Diagnosis not present

## 2017-09-14 DIAGNOSIS — R5382 Chronic fatigue, unspecified: Secondary | ICD-10-CM | POA: Diagnosis not present

## 2017-09-14 DIAGNOSIS — Z79899 Other long term (current) drug therapy: Secondary | ICD-10-CM | POA: Diagnosis not present

## 2017-09-14 DIAGNOSIS — E669 Obesity, unspecified: Secondary | ICD-10-CM | POA: Diagnosis not present

## 2017-09-14 DIAGNOSIS — M2550 Pain in unspecified joint: Secondary | ICD-10-CM | POA: Diagnosis not present

## 2017-10-13 DIAGNOSIS — H25813 Combined forms of age-related cataract, bilateral: Secondary | ICD-10-CM | POA: Diagnosis not present

## 2017-10-20 DIAGNOSIS — Z79899 Other long term (current) drug therapy: Secondary | ICD-10-CM | POA: Diagnosis not present

## 2017-10-20 DIAGNOSIS — M0609 Rheumatoid arthritis without rheumatoid factor, multiple sites: Secondary | ICD-10-CM | POA: Diagnosis not present

## 2017-10-24 DIAGNOSIS — Z79899 Other long term (current) drug therapy: Secondary | ICD-10-CM | POA: Diagnosis not present

## 2017-10-24 DIAGNOSIS — I10 Essential (primary) hypertension: Secondary | ICD-10-CM | POA: Diagnosis not present

## 2017-10-24 DIAGNOSIS — Z6835 Body mass index (BMI) 35.0-35.9, adult: Secondary | ICD-10-CM | POA: Diagnosis not present

## 2017-10-24 DIAGNOSIS — F988 Other specified behavioral and emotional disorders with onset usually occurring in childhood and adolescence: Secondary | ICD-10-CM | POA: Diagnosis not present

## 2017-10-24 DIAGNOSIS — E785 Hyperlipidemia, unspecified: Secondary | ICD-10-CM | POA: Diagnosis not present

## 2017-11-06 DIAGNOSIS — I499 Cardiac arrhythmia, unspecified: Secondary | ICD-10-CM | POA: Insufficient documentation

## 2017-11-06 HISTORY — DX: Cardiac arrhythmia, unspecified: I49.9

## 2017-11-29 DIAGNOSIS — Z79899 Other long term (current) drug therapy: Secondary | ICD-10-CM | POA: Diagnosis not present

## 2017-11-29 DIAGNOSIS — M0609 Rheumatoid arthritis without rheumatoid factor, multiple sites: Secondary | ICD-10-CM | POA: Diagnosis not present

## 2017-12-06 DIAGNOSIS — E860 Dehydration: Secondary | ICD-10-CM | POA: Diagnosis not present

## 2017-12-06 DIAGNOSIS — I951 Orthostatic hypotension: Secondary | ICD-10-CM | POA: Diagnosis not present

## 2017-12-06 DIAGNOSIS — A419 Sepsis, unspecified organism: Secondary | ICD-10-CM | POA: Diagnosis not present

## 2017-12-06 DIAGNOSIS — R5383 Other fatigue: Secondary | ICD-10-CM | POA: Diagnosis not present

## 2017-12-06 DIAGNOSIS — R42 Dizziness and giddiness: Secondary | ICD-10-CM | POA: Diagnosis not present

## 2017-12-06 DIAGNOSIS — I10 Essential (primary) hypertension: Secondary | ICD-10-CM | POA: Diagnosis not present

## 2017-12-07 DIAGNOSIS — R000 Tachycardia, unspecified: Secondary | ICD-10-CM | POA: Diagnosis not present

## 2017-12-07 DIAGNOSIS — Z6835 Body mass index (BMI) 35.0-35.9, adult: Secondary | ICD-10-CM | POA: Diagnosis not present

## 2017-12-07 DIAGNOSIS — Z23 Encounter for immunization: Secondary | ICD-10-CM | POA: Diagnosis not present

## 2017-12-09 ENCOUNTER — Ambulatory Visit (INDEPENDENT_AMBULATORY_CARE_PROVIDER_SITE_OTHER): Payer: Medicare Other | Admitting: Cardiology

## 2017-12-09 DIAGNOSIS — I10 Essential (primary) hypertension: Secondary | ICD-10-CM

## 2017-12-09 DIAGNOSIS — R0602 Shortness of breath: Secondary | ICD-10-CM | POA: Diagnosis not present

## 2017-12-09 DIAGNOSIS — I471 Supraventricular tachycardia, unspecified: Secondary | ICD-10-CM

## 2017-12-09 DIAGNOSIS — M069 Rheumatoid arthritis, unspecified: Secondary | ICD-10-CM | POA: Insufficient documentation

## 2017-12-09 DIAGNOSIS — I4892 Unspecified atrial flutter: Secondary | ICD-10-CM | POA: Diagnosis not present

## 2017-12-09 DIAGNOSIS — M1990 Unspecified osteoarthritis, unspecified site: Secondary | ICD-10-CM | POA: Diagnosis not present

## 2017-12-09 DIAGNOSIS — Z87891 Personal history of nicotine dependence: Secondary | ICD-10-CM | POA: Diagnosis not present

## 2017-12-09 DIAGNOSIS — R0609 Other forms of dyspnea: Secondary | ICD-10-CM | POA: Insufficient documentation

## 2017-12-09 DIAGNOSIS — R0600 Dyspnea, unspecified: Secondary | ICD-10-CM

## 2017-12-09 HISTORY — DX: Supraventricular tachycardia, unspecified: I47.10

## 2017-12-09 HISTORY — DX: Supraventricular tachycardia: I47.1

## 2017-12-09 HISTORY — DX: Rheumatoid arthritis, unspecified: M06.9

## 2017-12-09 HISTORY — DX: Other forms of dyspnea: R06.09

## 2017-12-09 HISTORY — DX: Dyspnea, unspecified: R06.00

## 2017-12-09 HISTORY — DX: Essential (primary) hypertension: I10

## 2017-12-09 NOTE — Progress Notes (Signed)
Cardiology Consultation:    Date:  12/09/2017   ID:  Brett Rosales, DOB [birth year], MRN [id number]  PCP:  Ronita Hipps, MD  Cardiologist:  Jenne Campus, MD   Referring MD: Ronita Hipps, MD   Chief Complaint  Patient presents with  . Hypotension    Up and down today, and up today  . Tachycardia  I have fast heartbeats  History of Present Illness:    Brett Rosales is a 72 y.o. male who is being seen today for the evaluation of SVT at the request of Ronita Hipps, MD.  On Tuesday he started feeling his heart spitting up.  Also he checked his blood pressure his blood pressure was low 85 systolic he decided to go to the emergency room.  In the emergency room he was told to be dehydrated he was getting IV fluids but interestingly his EKG showed narrow complex tachycardia indicating supraventricular tachycardia.  He was sent home.  While he is being home he still complain of having palpitations.  However he check his blood pressure on the regular basis and appearance that his heart rate will be 70 and then it will shoot up to 1 35-1 45.  There is no dizziness no syncope no chest pain no shortness of breath no tightness squeezing pressure burning chest.  He overall looks good and feels good.  He is here today in my office to be established as a patient and his EKG showed narrow complex tachycardia most likely short RP tachycardia and with indicating supraventricular tachycardia.  He is completely asymptomatic.  I told him that he need to go to the emergency room I would like to transfer him via ambulance however he refused he said he will drive himself there.  Again he is completely asymptomatic  Past Medical History:  Diagnosis Date  . Anxiety   . Arthritis   . Hypertension     Past Surgical History:  Procedure Laterality Date  . TONSILLECTOMY AND ADENOIDECTOMY      Current Medications: Current Meds  Medication Sig  . aspirin 81 MG tablet Take 81 mg by mouth daily.  . DULoxetine  (CYMBALTA) 30 MG capsule Take 30 mg by mouth daily.  Marland Kitchen ezetimibe (ZETIA) 10 MG tablet Take 10 mg by mouth daily.  . folic acid (FOLVITE) 1 MG tablet Take 1 mg by mouth daily.  . Methotrexate, Anti-Rheumatic, (METHOTREXATE, PF, [place]) Inject 1 mL into the skin once a week.  . Tocilizumab (ACTEMRA IV) Inject into the vein.  Marland Kitchen traMADol (ULTRAM) 50 MG tablet Take 50 mg by mouth every 6 (six) hours as needed.  . valsartan-hydrochlorothiazide (DIOVAN-HCT) 160-25 MG tablet Take 1 tablet by mouth daily.     Allergies:   Nsaids and Statins   Social History   Socioeconomic History  . Marital status: Married    Spouse name: Not on file  . Number of children: Not on file  . Years of education: Not on file  . Highest education level: Not on file  Occupational History  . Not on file  Social Needs  . Financial resource strain: Not on file  . Food insecurity:    Worry: Not on file    Inability: Not on file  . Transportation needs:    Medical: Not on file    Non-medical: Not on file  Tobacco Use  . Smoking status: Never Smoker  Substance and Sexual Activity  . Alcohol use: No    Alcohol/week: 0.0 standard drinks  .  Drug use: No  . Sexual activity: Not on file  Lifestyle  . Physical activity:    Days per week: Not on file    Minutes per session: Not on file  . Stress: Not on file  Relationships  . Social connections:    Talks on phone: Not on file    Gets together: Not on file    Attends religious service: Not on file    Active member of club or organization: Not on file    Attends meetings of clubs or organizations: Not on file    Relationship status: Not on file  Other Topics Concern  . Not on file  Social History Narrative  . Not on file     Family History: The patient's family history includes Cancer in his mother; Diabetes in his mother; Hypertension in his father. ROS:   Please see the history of present illness.    All 14 point review of systems negative except as  described per history of present illness.  EKGs/Labs/Other Studies Reviewed:    The following studies were reviewed today:   EKG:  EKG is  ordered today.  The ekg ordered today demonstrates narrow complex tachycardia which is short RP tachycardia rate 146.  There is Q waves leads V1 V2 indicating possibility of on anterior septal wall microinfarction nonspecific ST segment changes.  I do not have EKG while in sinus rhythm  Recent Labs: No results found for requested labs within last 8760 hours.  Recent Lipid Panel No results found for: CHOL, TRIG, HDL, CHOLHDL, VLDL, LDLCALC, LDLDIRECT  Physical Exam:    VS:  BP 140/80   Pulse (!) 146   Ht 6\' 2"  (1.88 m)   Wt 266 lb (120.7 kg)   SpO2 98%   BMI 34.15 kg/m     Wt Readings from Last 3 Encounters:  12/09/17 266 lb (120.7 kg)     GEN:  Well nourished, well developed in no acute distress HEENT: Normal NECK: No JVD; No carotid bruits LYMPHATICS: No lymphadenopathy CARDIAC: RRR, no murmurs, no rubs, no gallops RESPIRATORY:  Clear to auscultation without rales, wheezing or rhonchi  ABDOMEN: Soft, non-tender, non-distended MUSCULOSKELETAL:  No edema; No deformity  SKIN: Warm and dry NEUROLOGIC:  Alert and oriented x 3 PSYCHIATRIC:  Normal affect   ASSESSMENT:    1. Supraventricular tachycardia ([place])   2. Essential hypertension   3. Dyspnea on exertion    PLAN:    In order of problems listed above:  1. SVT: I will send him to the emergency room he refused transfer in the ambulance.  He will required adenosine and then most likely beta-blocker to suppress his arrhythmia.  His blood pressure medication may need to be modified because of use of beta-blocker.  I will schedule him also to have an echocardiogram that will happen as an outpatient. 2. Essential hypertension we will follow this carefully after beta-blocker will be started. 3. Dyspnea on exertion most likely related to his tachycardia.  We will get echocardiogram to  assess left ventricular ejection fraction.   Medication Adjustments/Labs and Tests Ordered: Current medicines are reviewed at length with the patient today.  Concerns regarding medicines are outlined above.  No orders of the defined types were placed in this encounter.  No orders of the defined types were placed in this encounter.   Signed, Park Liter, MD, [facility]. 12/09/2017 4:02 PM    [facility]

## 2017-12-12 NOTE — Addendum Note (Signed)
Addended by: Mattie Marlin on: 12/12/2017 09:45 AM   Modules accepted: Orders

## 2017-12-13 DIAGNOSIS — M2550 Pain in unspecified joint: Secondary | ICD-10-CM | POA: Diagnosis not present

## 2017-12-13 DIAGNOSIS — R5382 Chronic fatigue, unspecified: Secondary | ICD-10-CM | POA: Diagnosis not present

## 2017-12-13 DIAGNOSIS — Z6835 Body mass index (BMI) 35.0-35.9, adult: Secondary | ICD-10-CM | POA: Diagnosis not present

## 2017-12-13 DIAGNOSIS — R071 Chest pain on breathing: Secondary | ICD-10-CM | POA: Diagnosis not present

## 2017-12-13 DIAGNOSIS — Z79899 Other long term (current) drug therapy: Secondary | ICD-10-CM | POA: Diagnosis not present

## 2017-12-13 DIAGNOSIS — M0609 Rheumatoid arthritis without rheumatoid factor, multiple sites: Secondary | ICD-10-CM | POA: Diagnosis not present

## 2017-12-13 DIAGNOSIS — R202 Paresthesia of skin: Secondary | ICD-10-CM | POA: Diagnosis not present

## 2017-12-13 DIAGNOSIS — E669 Obesity, unspecified: Secondary | ICD-10-CM | POA: Diagnosis not present

## 2017-12-14 ENCOUNTER — Telehealth: Payer: Self-pay | Admitting: Emergency Medicine

## 2017-12-14 ENCOUNTER — Telehealth: Payer: Self-pay | Admitting: Cardiology

## 2017-12-14 DIAGNOSIS — R079 Chest pain, unspecified: Secondary | ICD-10-CM | POA: Diagnosis not present

## 2017-12-14 DIAGNOSIS — I4892 Unspecified atrial flutter: Secondary | ICD-10-CM | POA: Diagnosis not present

## 2017-12-14 DIAGNOSIS — I10 Essential (primary) hypertension: Secondary | ICD-10-CM | POA: Diagnosis not present

## 2017-12-14 DIAGNOSIS — Z87891 Personal history of nicotine dependence: Secondary | ICD-10-CM | POA: Diagnosis not present

## 2017-12-14 NOTE — Telephone Encounter (Signed)
Left message for patient to return call.

## 2017-12-14 NOTE — Telephone Encounter (Signed)
Spoke with patient. He reports feeling shortness of breath, palpitations, and leg swelling since saturday. Patient was seen in emergency department on Friday and hasn't gotten better. Patient reports heart rate ranging from 130-140. Patient advised to go to the emergency department and have someone drive him there. He verbally understands.

## 2017-12-14 NOTE — Telephone Encounter (Signed)
Patient returned your call.

## 2017-12-14 NOTE — Patient Instructions (Signed)
Medication Instructions:  Your physician recommends that you continue on your current medications as directed. Please refer to the Current Medication list given to you today.  If you need a refill on your cardiac medications before your next appointment, please call your pharmacy.   Lab work: None.  If you have labs (blood work) drawn today and your tests are completely normal, you will receive your results only by: Marland Kitchen MyChart Message (if you have MyChart) OR . A paper copy in the mail If you have any lab test that is abnormal or we need to change your treatment, we will call you to review the results.  Testing/Procedures: Your physician has requested that you have an echocardiogram. Echocardiography is a painless test that uses sound waves to create images of your heart. It provides your doctor with information about the size and shape of your heart and how well your heart's chambers and valves are working. This procedure takes approximately one hour. There are no restrictions for this procedure.    Follow-Up: At [facility], you and your health needs are our priority.  As part of our continuing mission to provide you with exceptional heart care, we have created designated Provider Care Teams.  These Care Teams include your primary Cardiologist (physician) and Advanced Practice Providers (APPs -  Physician Assistants and Nurse Practitioners) who all work together to provide you with the care you need, when you need it. You will need a follow up appointment in 2 weeks.  Please call our office 2 months in advance to schedule this appointment.  You may see No primary care provider on file. or another member of our [employer] Provider Team in [place]: Shirlee More, MD . Jyl Heinz, MD  Any Other Special Instructions Will Be Listed Below (If Applicable).   Echocardiogram An echocardiogram, or echocardiography, uses sound waves (ultrasound) to produce an image of your heart. The  echocardiogram is simple, painless, obtained within a short period of time, and offers valuable information to your health care provider. The images from an echocardiogram can provide information such as:  Evidence of coronary artery disease (CAD).  Heart size.  Heart muscle function.  Heart valve function.  Aneurysm detection.  Evidence of a past heart attack.  Fluid buildup around the heart.  Heart muscle thickening.  Assess heart valve function.  Tell a health care provider about:  Any allergies you have.  All medicines you are taking, including vitamins, herbs, eye drops, creams, and over-the-counter medicines.  Any problems you or family members have had with anesthetic medicines.  Any blood disorders you have.  Any surgeries you have had.  Any medical conditions you have.  Whether you are pregnant or may be pregnant. What happens before the procedure? No special preparation is needed. Eat and drink normally. What happens during the procedure?  In order to produce an image of your heart, gel will be applied to your chest and a wand-like tool (transducer) will be moved over your chest. The gel will help transmit the sound waves from the transducer. The sound waves will harmlessly bounce off your heart to allow the heart images to be captured in real-time motion. These images will then be recorded.  You may need an IV to receive a medicine that improves the quality of the pictures. What happens after the procedure? You may return to your normal schedule including diet, activities, and medicines, unless your health care provider tells you otherwise. This information is not intended to replace  given to you by your health care provider. Make sure you discuss any questions you have with your health care provider. Document Released: 02/20/2000 Document Revised: 10/11/2015 Document Reviewed: 10/30/2012 Elsevier Interactive Patient Education  2017 Elsevier  Inc.    

## 2017-12-14 NOTE — Telephone Encounter (Signed)
Pt phoned back and gave alternative number to call: [phone]

## 2017-12-14 NOTE — Addendum Note (Signed)
Addended by: Linna Hoff R on: 12/14/2017 10:01 AM   Modules accepted: Orders

## 2017-12-15 NOTE — Telephone Encounter (Signed)
Called to check on patient after being seen in the emergency department. Patient reports receiving lopressor he believes via IV while at the hospital. He was told to still be in atrial flutter. They advised he increased metoprolol to 50 mg bid from 25 mg. Patient hasn't done this yet. Today he reports heart rate 74 and blood pressure 149/90 along with palpitations, dizziness and leg swelling remains. Patient does feel a little better than yesterday. Will inform Dr. Agustin Cree

## 2017-12-15 NOTE — Telephone Encounter (Signed)
Per Dr. Agustin Cree patient advised to come to office tomorrow. Appointment verified with patient. Patient also advised to take metoprolol 25 mg bid and if needed for heart rate over 100 to take and extra 25 mg. Patient verbally understands.

## 2017-12-16 ENCOUNTER — Ambulatory Visit (INDEPENDENT_AMBULATORY_CARE_PROVIDER_SITE_OTHER): Payer: Medicare Other | Admitting: Cardiology

## 2017-12-16 ENCOUNTER — Encounter: Payer: Self-pay | Admitting: Cardiology

## 2017-12-16 DIAGNOSIS — I483 Typical atrial flutter: Secondary | ICD-10-CM | POA: Insufficient documentation

## 2017-12-16 HISTORY — DX: Typical atrial flutter: I48.3

## 2017-12-16 NOTE — Progress Notes (Signed)
Cardiology Office Note:    Date:  12/16/2017   ID:  Brett Rosales, DOB 1945-08-22, MRN [id number]  PCP:  Ronita Hipps, MD  Cardiologist:  Jenne Campus, MD    Referring MD: Ronita Hipps, MD   Chief Complaint  Patient presents with  . ER Follow up  Feeling better  History of Present Illness:    Brett Rosales is a 72 y.o. male with paroxysmal atrial flutter.  I met him first time last week in the office when he came with what appears to be supraventricular tachycardia his weight was 146 we brought him to the emergency room then we realized that with some AV blocking agent if this is truly atrial flutter we initiated anticoagulation beta-blocker was started as well.  At the time also he was complaining of having some shortness of breath and swelling of lower extremities.  His rate became controlled today he comes for follow-up.  It looks like he converted to sinus rhythm he remember exactly the moment it happened apparently he heard some jokes started laughing hysterically after that he went back to normal rhythm feeling much better swelling of lower extremities better he is back to himself.  He is scheduled to have echocardiogram on Monday we will check post ejection fraction is.  Past Medical History:  Diagnosis Date  . Anxiety   . Arthritis   . Hypertension     Past Surgical History:  Procedure Laterality Date  . TONSILLECTOMY AND ADENOIDECTOMY      Current Medications: Current Meds  Medication Sig  . aspirin 81 MG tablet Take 81 mg by mouth daily.  . DULoxetine (CYMBALTA) 30 MG capsule Take 30 mg by mouth daily.  Marland Kitchen ezetimibe (ZETIA) 10 MG tablet Take 10 mg by mouth daily.  . folic acid (FOLVITE) 1 MG tablet Take 1 mg by mouth daily.  . Methotrexate, Anti-Rheumatic, (METHOTREXATE, PF, [place]) Inject 1 mL into the skin once a week.  . metoprolol tartrate (LOPRESSOR) 25 MG tablet Take 25 mg by mouth 2 (two) times daily.  . Tocilizumab (ACTEMRA IV) Inject into the vein.  Marland Kitchen  traMADol (ULTRAM) 50 MG tablet Take 50 mg by mouth every 6 (six) hours as needed.  . valsartan (DIOVAN) 80 MG tablet Take 80 mg by mouth daily.  . valsartan-hydrochlorothiazide (DIOVAN-HCT) 160-25 MG tablet Take 1 tablet by mouth daily.  Alveda Reasons 20 MG TABS tablet Take 20 mg by mouth daily.     Allergies:   Nsaids and Statins   Social History   Socioeconomic History  . Marital status: Married    Spouse name: Not on file  . Number of children: Not on file  . Years of education: Not on file  . Highest education level: Not on file  Occupational History  . Not on file  Social Needs  . Financial resource strain: Not on file  . Food insecurity:    Worry: Not on file    Inability: Not on file  . Transportation needs:    Medical: Not on file    Non-medical: Not on file  Tobacco Use  . Smoking status: Never Smoker  . Smokeless tobacco: Never Used  Substance and Sexual Activity  . Alcohol use: No    Alcohol/week: 0.0 standard drinks  . Drug use: No  . Sexual activity: Not on file  Lifestyle  . Physical activity:    Days per week: Not on file    Minutes per session: Not on file  . Stress:  Not on file  Relationships  . Social connections:    Talks on phone: Not on file    Gets together: Not on file    Attends religious service: Not on file    Active member of club or organization: Not on file    Attends meetings of clubs or organizations: Not on file    Relationship status: Not on file  Other Topics Concern  . Not on file  Social History Narrative  . Not on file     Family History: The patient's family history includes Cancer in his mother; Diabetes in his mother; Hypertension in his father. ROS:   Please see the history of present illness.    All 14 point review of systems negative except as described per history of present illness  EKGs/Labs/Other Studies Reviewed:      Recent Labs: No results found for requested labs within last 8760 hours.  Recent Lipid  Panel No results found for: CHOL, TRIG, HDL, CHOLHDL, VLDL, LDLCALC, LDLDIRECT  Physical Exam:    VS:  BP 124/70   Pulse 60   Ht 6' 2" (1.88 m)   Wt 263 lb 12.8 oz (119.7 kg)   SpO2 98%   BMI 33.87 kg/m     Wt Readings from Last 3 Encounters:  12/16/17 263 lb 12.8 oz (119.7 kg)  12/09/17 266 lb (120.7 kg)     GEN:  Well nourished, well developed in no acute distress HEENT: Normal NECK: No JVD; No carotid bruits LYMPHATICS: No lymphadenopathy CARDIAC: RRR, no murmurs, no rubs, no gallops RESPIRATORY:  Clear to auscultation without rales, wheezing or rhonchi  ABDOMEN: Soft, non-tender, non-distended MUSCULOSKELETAL:  No edema; No deformity  SKIN: Warm and dry LOWER EXTREMITIES: no swelling NEUROLOGIC:  Alert and oriented x 3 PSYCHIATRIC:  Normal affect   ASSESSMENT:    1. Typical atrial flutter (HCC)    PLAN:    In order of problems listed above:  1. Atrial flutter doing well from that point review now sinus rhythm EKG done to confirm it.  His chads 2 Vascor equals 2 so he is anticoagulated with Xarelto which I will continue. 2. Essential hypertension blood pressure controlled today we will continue present management. 3. Congestive heart failure most likely related to atrial flutter again he is scheduled to have echocardiogram will check for results of the test. 4. Rheumatoid arthritis.  Stable.  Medical record from emergency room reviewed.   Medication Adjustments/Labs and Tests Ordered: Current medicines are reviewed at length with the patient today.  Concerns regarding medicines are outlined above.  No orders of the defined types were placed in this encounter.  Medication changes: No orders of the defined types were placed in this encounter.   Signed, Park Liter, MD, [facility] 12/16/2017 11:43 AM    [place]

## 2017-12-16 NOTE — Patient Instructions (Signed)
Medication Instructions:  Your physician recommends that you continue on your current medications as directed. Please refer to the Current Medication list given to you today.  If you need a refill on your cardiac medications before your next appointment, please call your pharmacy.   Lab work: None.  If you have labs (blood work) drawn today and your tests are completely normal, you will receive your results only by: . MyChart Message (if you have MyChart) OR . A paper copy in the mail If you have any lab test that is abnormal or we need to change your treatment, we will call you to review the results.  Testing/Procedures: None.   Follow-Up: At [facility], you and your health needs are our priority.  As part of our continuing mission to provide you with exceptional heart care, we have created designated Provider Care Teams.  These Care Teams include your primary Cardiologist (physician) and Advanced Practice Providers (APPs -  Physician Assistants and Nurse Practitioners) who all work together to provide you with the care you need, when you need it. You will need a follow up appointment in 1 months.  Please call our office 2 months in advance to schedule this appointment.  You may see Robert Krasowski, MD or another member of our [facility] Provider Team in [place]: Brian Munley, MD . Rajan Revankar, MD  Any Other Special Instructions Will Be Listed Below (If Applicable).     

## 2017-12-19 ENCOUNTER — Ambulatory Visit (HOSPITAL_BASED_OUTPATIENT_CLINIC_OR_DEPARTMENT_OTHER)
Admission: RE | Admit: 2017-12-19 | Discharge: 2017-12-19 | Disposition: A | Discharge status: Home / Self Care | Payer: Medicare Other | Source: Ambulatory Visit | Attending: Cardiology | Admitting: Cardiology

## 2017-12-19 DIAGNOSIS — I10 Essential (primary) hypertension: Secondary | ICD-10-CM | POA: Diagnosis not present

## 2017-12-19 DIAGNOSIS — I4892 Unspecified atrial flutter: Secondary | ICD-10-CM | POA: Diagnosis not present

## 2017-12-19 DIAGNOSIS — R0609 Other forms of dyspnea: Secondary | ICD-10-CM | POA: Insufficient documentation

## 2017-12-19 NOTE — Progress Notes (Signed)
  Echocardiogram 2D Echocardiogram has been performed.  Brett Rosales [name] 12/19/2017, 2:27 PM

## 2017-12-20 NOTE — Addendum Note (Signed)
Addended by: Mattie Marlin on: 12/20/2017 08:35 AM   Modules accepted: Orders

## 2017-12-23 ENCOUNTER — Ambulatory Visit (INDEPENDENT_AMBULATORY_CARE_PROVIDER_SITE_OTHER): Payer: Medicare Other | Admitting: Cardiology

## 2017-12-23 ENCOUNTER — Encounter: Payer: Self-pay | Admitting: Cardiology

## 2017-12-23 VITALS — BP 118/72 | HR 62 | Ht 74.0 in | Wt 262.4 lb

## 2017-12-23 DIAGNOSIS — I7121 Aneurysm of the ascending aorta, without rupture: Secondary | ICD-10-CM | POA: Insufficient documentation

## 2017-12-23 DIAGNOSIS — I10 Essential (primary) hypertension: Secondary | ICD-10-CM | POA: Diagnosis not present

## 2017-12-23 DIAGNOSIS — R0609 Other forms of dyspnea: Secondary | ICD-10-CM

## 2017-12-23 DIAGNOSIS — M069 Rheumatoid arthritis, unspecified: Secondary | ICD-10-CM

## 2017-12-23 DIAGNOSIS — I483 Typical atrial flutter: Secondary | ICD-10-CM | POA: Diagnosis not present

## 2017-12-23 DIAGNOSIS — I712 Thoracic aortic aneurysm, without rupture: Secondary | ICD-10-CM

## 2017-12-23 DIAGNOSIS — R0600 Dyspnea, unspecified: Secondary | ICD-10-CM

## 2017-12-23 HISTORY — DX: Aneurysm of the ascending aorta, without rupture: I71.21

## 2017-12-23 HISTORY — DX: Thoracic aortic aneurysm, without rupture: I71.2

## 2017-12-23 MED ORDER — FUROSEMIDE 20 MG PO TABS: 20.0000 mg | ORAL_TABLET | Freq: Every day | ORAL | 3 refills | Status: DC

## 2017-12-23 NOTE — Patient Instructions (Signed)
Medication Instructions:  Your physician has recommended you make the following change in your medication:   START: Lasix 20 mg daily.   If you need a refill on your cardiac medications before your next appointment, please call your pharmacy.   Lab work: None. If you have labs (blood work) drawn today and your tests are completely normal, you will receive your results only by: Marland Kitchen MyChart Message (if you have MyChart) OR . A paper copy in the mail If you have any lab test that is abnormal or we need to change your treatment, we will call you to review the results.  Testing/Procedures: None.  Follow-Up: At [facility], you and your health needs are our priority.  As part of our continuing mission to provide you with exceptional heart care, we have created designated Provider Care Teams.  These Care Teams include your primary Cardiologist (physician) and Advanced Practice Providers (APPs -  Physician Assistants and Nurse Practitioners) who all work together to provide you with the care you need, when you need it. You will need a follow up appointment in 6 weeks.  Please call our office 2 months in advance to schedule this appointment.  You may see Jenne Campus, MD or another member of our [place] Provider Team in [place]: Shirlee More, MD . Jyl Heinz, MD  Any Other Special Instructions Will Be Listed Below (If Applicable).  Furosemide tablets What is this medicine? FUROSEMIDE (fyoor [place] se mide) is a diuretic. It helps you make more urine and to lose salt and excess water from your body. This medicine is used to treat high blood pressure, and edema or swelling from heart, kidney, or liver disease. This medicine may be used for other purposes; ask your health care provider or pharmacist if you have questions. COMMON BRAND NAME(S): Active-Medicated Specimen Kit, Delone, Diuscreen, Lasix, RX Specimen Collection Kit, Specimen Collection Kit, URINX Medicated Specimen  Collection What should I tell my health care provider before I take this medicine? They need to know if you have any of these conditions: -abnormal blood electrolytes -diarrhea or vomiting -gout -heart disease -kidney disease, small amounts of urine, or difficulty passing urine -liver disease -thyroid disease -an unusual or allergic reaction to furosemide, sulfa drugs, other medicines, foods, dyes, or preservatives -pregnant or trying to get pregnant -breast-feeding How should I use this medicine? Take this medicine by mouth with a glass of water. Follow the directions on the prescription label. You may take this medicine with or without food. If it upsets your stomach, take it with food or milk. Do not take your medicine more often than directed. Remember that you will need to pass more urine after taking this medicine. Do not take your medicine at a time of day that will cause you problems. Do not take at bedtime. Talk to your pediatrician regarding the use of this medicine in children. While this drug may be prescribed for selected conditions, precautions do apply. Overdosage: If you think you have taken too much of this medicine contact a poison control center or emergency room at once. NOTE: This medicine is only for you. Do not share this medicine with others. What if I miss a dose? If you miss a dose, take it as soon as you can. If it is almost time for your next dose, take only that dose. Do not take double or extra doses. What may interact with this medicine? -aspirin and aspirin-like medicines -certain antibiotics -chloral hydrate -cisplatin -cyclosporine -digoxin -diuretics -laxatives -  lithium -medicines for blood pressure -medicines that relax muscles for surgery -methotrexate -NSAIDs, medicines for pain and inflammation like ibuprofen, naproxen, or indomethacin -phenytoin -steroid medicines like prednisone or cortisone -sucralfate -thyroid hormones This list may not  describe all possible interactions. Give your health care provider a list of all the medicines, herbs, non-prescription drugs, or dietary supplements you use. Also tell them if you smoke, drink alcohol, or use illegal drugs. Some items may interact with your medicine. What should I watch for while using this medicine? Visit your doctor or health care professional for regular checks on your progress. Check your blood pressure regularly. Ask your doctor or health care professional what your blood pressure should be, and when you should contact him or her. If you are a diabetic, check your blood sugar as directed. You may need to be on a special diet while taking this medicine. Check with your doctor. Also, ask how many glasses of fluid you need to drink a day. You must not get dehydrated. You may get drowsy or dizzy. Do not drive, use machinery, or do anything that needs mental alertness until you know how this drug affects you. Do not stand or sit up quickly, especially if you are an older patient. This reduces the risk of dizzy or fainting spells. Alcohol can make you more drowsy and dizzy. Avoid alcoholic drinks. This medicine can make you more sensitive to the sun. Keep out of the sun. If you cannot avoid being in the sun, wear protective clothing and use sunscreen. Do not use sun lamps or tanning beds/booths. What side effects may I notice from receiving this medicine? Side effects that you should report to your doctor or health care professional as soon as possible: -blood in urine or stools -dry mouth -fever or chills -hearing loss or ringing in the ears -irregular heartbeat -muscle pain or weakness, cramps -skin rash -stomach upset, pain, or nausea -tingling or numbness in the hands or feet -unusually weak or tired -vomiting or diarrhea -yellowing of the eyes or skin Side effects that usually do not require medical attention (report to your doctor or health care professional if they  continue or are bothersome): -headache -loss of appetite -unusual bleeding or bruising This list may not describe all possible side effects. Call your doctor for medical advice about side effects. You may report side effects to FDA at 1-800-FDA-1088. Where should I keep my medicine? Keep out of the reach of children. Store at room temperature between 15 and 30 degrees C (59 and 86 degrees F). Protect from light. Throw away any unused medicine after the expiration date. NOTE: This sheet is a summary. It may not cover all possible information. If you have questions about this medicine, talk to your doctor, pharmacist, or health care provider.  2018 Elsevier/Gold Standard (2014-05-15 13:49:50)

## 2017-12-23 NOTE — Progress Notes (Signed)
Cardiology Office Note:    Date:  12/23/2017   ID:  [name], DOB 06-28-1945, MRN [id number]  PCP:  Ronita Hipps, MD  Cardiologist:  Jenne Campus, MD    Referring MD: Ronita Hipps, MD   Chief Complaint  Patient presents with  . 2 Week Follow-up  Doing well  History of Present Illness:    Brett Rosales is a 72 y.o. male with paroxysmal atrial flutter which seems to be typical converted to sinus rhythm spontaneously.  Anticoagulated with Xarelto which we will continue over doing all doing well, described to have some fatigue and tiredness but overall much better than before no palpitations does have minimal swelling of lower extremities.  Past Medical History:  Diagnosis Date  . Anxiety   . Arthritis   . Hypertension     Past Surgical History:  Procedure Laterality Date  . TONSILLECTOMY AND ADENOIDECTOMY      Current Medications: Current Meds  Medication Sig  . aspirin 81 MG tablet Take 81 mg by mouth daily.  . DULoxetine (CYMBALTA) 30 MG capsule Take 30 mg by mouth daily.  Marland Kitchen ezetimibe (ZETIA) 10 MG tablet Take 10 mg by mouth daily.  . folic acid (FOLVITE) 1 MG tablet Take 1 mg by mouth daily.  . Methotrexate, Anti-Rheumatic, (METHOTREXATE, PF, [place]) Inject 1 mL into the skin once a week.  . methylphenidate (RITALIN) 20 MG tablet Take 20 mg by mouth 3 (three) times daily with meals.  . metoprolol tartrate (LOPRESSOR) 25 MG tablet Take 25 mg by mouth 2 (two) times daily.  . Tocilizumab (ACTEMRA IV) Inject into the vein.  Marland Kitchen traMADol (ULTRAM) 50 MG tablet Take 50 mg by mouth every 6 (six) hours as needed.  . valsartan (DIOVAN) 80 MG tablet Take 80 mg by mouth daily.  . valsartan-hydrochlorothiazide (DIOVAN-HCT) 160-25 MG tablet Take 1 tablet by mouth daily.  Alveda Reasons 20 MG TABS tablet Take 20 mg by mouth daily.     Allergies:   Nsaids and Statins   Social History   Socioeconomic History  . Marital status: Married    Spouse name: Not on file  . Number of  children: Not on file  . Years of education: Not on file  . Highest education level: Not on file  Occupational History  . Not on file  Social Needs  . Financial resource strain: Not on file  . Food insecurity:    Worry: Not on file    Inability: Not on file  . Transportation needs:    Medical: Not on file    Non-medical: Not on file  Tobacco Use  . Smoking status: Never Smoker  . Smokeless tobacco: Never Used  Substance and Sexual Activity  . Alcohol use: No    Alcohol/week: 0.0 standard drinks  . Drug use: No  . Sexual activity: Not on file  Lifestyle  . Physical activity:    Days per week: Not on file    Minutes per session: Not on file  . Stress: Not on file  Relationships  . Social connections:    Talks on phone: Not on file    Gets together: Not on file    Attends religious service: Not on file    Active member of club or organization: Not on file    Attends meetings of clubs or organizations: Not on file    Relationship status: Not on file  Other Topics Concern  . Not on file  Social History Narrative  .  Not on file     Family History: The patient's family history includes Cancer in his mother; Diabetes in his mother; Hypertension in his father. ROS:   Please see the history of present illness.    All 14 point review of systems negative except as described per history of present illness  EKGs/Labs/Other Studies Reviewed:      Recent Labs: No results found for requested labs within last 8760 hours.  Recent Lipid Panel No results found for: CHOL, TRIG, HDL, CHOLHDL, VLDL, LDLCALC, LDLDIRECT  Physical Exam:    VS:  BP 118/72   Pulse 62   Ht 6\' 2"  (1.88 m)   Wt 262 lb 6.4 oz (119 kg)   SpO2 98%   BMI 33.69 kg/m     Wt Readings from Last 3 Encounters:  12/23/17 262 lb 6.4 oz (119 kg)  12/16/17 263 lb 12.8 oz (119.7 kg)  12/09/17 266 lb (120.7 kg)     GEN:  Well nourished, well developed in no acute distress HEENT: Normal NECK: No JVD; No  carotid bruits LYMPHATICS: No lymphadenopathy CARDIAC: RRR, no murmurs, no rubs, no gallops RESPIRATORY:  Clear to auscultation without rales, wheezing or rhonchi  ABDOMEN: Soft, non-tender, non-distended MUSCULOSKELETAL:  No edema; No deformity  SKIN: Warm and dry LOWER EXTREMITIES: no swelling NEUROLOGIC:  Alert and oriented x 3 PSYCHIATRIC:  Normal affect   ASSESSMENT:    1. Essential hypertension   2. Typical atrial flutter ([place])   3. Rheumatoid arthritis involving shoulder, unspecified laterality, unspecified rheumatoid factor presence ([place])   4. Dyspnea on exertion   5. Ascending aortic aneurysm ([facility])    PLAN:    In order of problems listed above:  1. Essential hypertension blood pressure well controlled continue present management echocardiogram reviewed with him which showed moderate left ventricle hypertrophy that should get better with the appropriate management of hypertension 2. Typical atrial flutter.  Maintaining sinus rhythm.  I will continue present management including anticoagulation he is complaining about prices of Xarelto I gave him a list of direct oral anticoagulant agents so he can check prices. 3. Dyspnea on exertion manageable.  I will ask him to start taking Lasix 20 mg to improve shortness of breath as well as to help with minimal swelling of lower extremity he has 4. Ascending aortic aneurysm measuring 43 mm on echocardiogram we will schedule him to have CT Angie of his chest to look at the entire aorta.   Medication Adjustments/Labs and Tests Ordered: Current medicines are reviewed at length with the patient today.  Concerns regarding medicines are outlined above.  No orders of the defined types were placed in this encounter.  Medication changes:  Meds ordered this encounter  Medications  . furosemide (LASIX) 20 MG tablet    Sig: Take 1 tablet (20 mg total) by mouth daily.    Dispense:  90 tablet    Refill:  3    Signed, Park Liter,  MD, [facility] 12/23/2017 5:11 PM    [place]

## 2017-12-27 DIAGNOSIS — Z79899 Other long term (current) drug therapy: Secondary | ICD-10-CM | POA: Diagnosis not present

## 2017-12-27 DIAGNOSIS — M0609 Rheumatoid arthritis without rheumatoid factor, multiple sites: Secondary | ICD-10-CM | POA: Diagnosis not present

## 2018-01-16 ENCOUNTER — Other Ambulatory Visit: Payer: Self-pay | Admitting: Emergency Medicine

## 2018-01-16 MED ORDER — METOPROLOL TARTRATE 25 MG PO TABS: 25.0000 mg | ORAL_TABLET | Freq: Two times a day (BID) | ORAL | 1 refills | Status: DC

## 2018-01-16 MED ORDER — XARELTO 20 MG PO TABS: 20.0000 mg | ORAL_TABLET | Freq: Every day | ORAL | 1 refills | Status: DC

## 2018-01-16 MED ORDER — VALSARTAN 80 MG PO TABS: 80.0000 mg | ORAL_TABLET | Freq: Every day | ORAL | 1 refills | Status: DC

## 2018-01-16 NOTE — Telephone Encounter (Signed)
Called to verify patient medications before refilling. Patient verified he is taking metoprolol 25 mg twice daily, valsartan 80 mg daily, and xarelto 20 mg daily all medications refilled. Patient also reported he is no longer taking aspirin and Dr. Shanda Bumps aware.

## 2018-01-24 ENCOUNTER — Encounter: Payer: Self-pay | Admitting: Cardiology

## 2018-01-24 ENCOUNTER — Ambulatory Visit (INDEPENDENT_AMBULATORY_CARE_PROVIDER_SITE_OTHER): Payer: Medicare Other | Admitting: Cardiology

## 2018-01-24 VITALS — BP 118/60 | HR 73 | Ht 74.0 in | Wt 266.8 lb

## 2018-01-24 DIAGNOSIS — M069 Rheumatoid arthritis, unspecified: Secondary | ICD-10-CM | POA: Diagnosis not present

## 2018-01-24 DIAGNOSIS — M0609 Rheumatoid arthritis without rheumatoid factor, multiple sites: Secondary | ICD-10-CM | POA: Diagnosis not present

## 2018-01-24 DIAGNOSIS — I10 Essential (primary) hypertension: Secondary | ICD-10-CM

## 2018-01-24 DIAGNOSIS — R0683 Snoring: Secondary | ICD-10-CM

## 2018-01-24 DIAGNOSIS — R0600 Dyspnea, unspecified: Secondary | ICD-10-CM

## 2018-01-24 DIAGNOSIS — R0609 Other forms of dyspnea: Secondary | ICD-10-CM

## 2018-01-24 DIAGNOSIS — I483 Typical atrial flutter: Secondary | ICD-10-CM

## 2018-01-24 DIAGNOSIS — R5383 Other fatigue: Secondary | ICD-10-CM

## 2018-01-24 NOTE — Progress Notes (Signed)
Cardiology Office Note:    Date:  01/24/2018   ID:  Brett Rosales, DOB [birth year], MRN [id number]  PCP:  Ronita Hipps, MD  Cardiologist:  Jenne Campus, MD    Referring MD: Ronita Hipps, MD   Chief Complaint  Patient presents with  . Follow-up  Doing well but tired especially in the morning  History of Present Illness:    Brett Rosales is a 72 y.o. male with one documented episode of atrial flutter.  He is anticoagulated which I will continue.  His chads 2 Vascor equals 2.  We had a discussion today about the fact that in the morning he feels exhausted his heart rate is between 45 and 50 5 in the morning he thinks slow heart rate make him feel that way.  I told him that the possibility and asked him to stop metoprolol for about 2 days and see if that make any difference.  On top of that he snores a lot and years ago he was diagnosed with sleep apnea he was given some CPAP machine but he could not use it it was huge noise and he did not like it I told him there is a lot of progress made in this area and I strongly recommend for him to have a sleep study with titration he agreed therefore we will schedule him to have a sleep study with titration.  Past Medical History:  Diagnosis Date  . Anxiety   . Arthritis   . Hypertension     Past Surgical History:  Procedure Laterality Date  . TONSILLECTOMY AND ADENOIDECTOMY      Current Medications: Current Meds  Medication Sig  . DULoxetine (CYMBALTA) 30 MG capsule Take 30 mg by mouth daily.  Marland Kitchen ezetimibe (ZETIA) 10 MG tablet Take 10 mg by mouth daily.  . folic acid (FOLVITE) 1 MG tablet Take 1 mg by mouth daily.  . furosemide (LASIX) 20 MG tablet Take 1 tablet (20 mg total) by mouth daily.  . Methotrexate, Anti-Rheumatic, (METHOTREXATE, PF, [place]) Inject 1 mL into the skin once a week.  . methylphenidate (RITALIN) 20 MG tablet Take 20 mg by mouth 3 (three) times daily with meals.  . metoprolol tartrate (LOPRESSOR) 25 MG tablet Take 1  tablet (25 mg total) by mouth 2 (two) times daily.  . Tocilizumab (ACTEMRA IV) Inject into the vein.  Marland Kitchen traMADol (ULTRAM) 50 MG tablet Take 50 mg by mouth every 6 (six) hours as needed.  . valsartan (DIOVAN) 80 MG tablet Take 1 tablet (80 mg total) by mouth daily.  Brett Rosales 20 MG TABS tablet Take 1 tablet (20 mg total) by mouth daily.     Allergies:   Nsaids and Statins   Social History   Socioeconomic History  . Marital status: Married    Spouse name: Not on file  . Number of children: Not on file  . Years of education: Not on file  . Highest education level: Not on file  Occupational History  . Not on file  Social Needs  . Financial resource strain: Not on file  . Food insecurity:    Worry: Not on file    Inability: Not on file  . Transportation needs:    Medical: Not on file    Non-medical: Not on file  Tobacco Use  . Smoking status: Never Smoker  . Smokeless tobacco: Never Used  Substance and Sexual Activity  . Alcohol use: No    Alcohol/week: 0.0 standard drinks  .  Drug use: No  . Sexual activity: Not on file  Lifestyle  . Physical activity:    Days per week: Not on file    Minutes per session: Not on file  . Stress: Not on file  Relationships  . Social connections:    Talks on phone: Not on file    Gets together: Not on file    Attends religious service: Not on file    Active member of club or organization: Not on file    Attends meetings of clubs or organizations: Not on file    Relationship status: Not on file  Other Topics Concern  . Not on file  Social History Narrative  . Not on file     Family History: The patient's family history includes Cancer in his mother; Diabetes in his mother; Hypertension in his father. ROS:   Please see the history of present illness.    All 14 point review of systems negative except as described per history of present illness  EKGs/Labs/Other Studies Reviewed:      Recent Labs: No results found for requested  labs within last 8760 hours.  Recent Lipid Panel No results found for: CHOL, TRIG, HDL, CHOLHDL, VLDL, LDLCALC, LDLDIRECT  Physical Exam:    VS:  BP 118/60   Pulse 73   Ht 6\' 2"  (1.88 m)   Wt 266 lb 12.8 oz (121 kg)   SpO2 98%   BMI 34.26 kg/m     Wt Readings from Last 3 Encounters:  01/24/18 266 lb 12.8 oz (121 kg)  12/23/17 262 lb 6.4 oz (119 kg)  12/16/17 263 lb 12.8 oz (119.7 kg)     GEN:  Well nourished, well developed in no acute distress HEENT: Normal NECK: No JVD; No carotid bruits LYMPHATICS: No lymphadenopathy CARDIAC: RRR, no murmurs, no rubs, no gallops RESPIRATORY:  Clear to auscultation without rales, wheezing or rhonchi  ABDOMEN: Soft, non-tender, non-distended MUSCULOSKELETAL:  No edema; No deformity  SKIN: Warm and dry LOWER EXTREMITIES: no swelling NEUROLOGIC:  Alert and oriented x 3 PSYCHIATRIC:  Normal affect   ASSESSMENT:    1. Essential hypertension   2. Typical atrial flutter (Brett Rosales)   3. Dyspnea on exertion   4. Rheumatoid arthritis involving shoulder, unspecified laterality, unspecified rheumatoid factor presence ([facility])    PLAN:    In order of problems listed above:  1. Essential hypertension blood pressure well controlled continue present management. 2. Typical atrial flutter chads vas score equals 2 continue anticoagulation.  I asked her to have EKG done today to see if he is in sinus rhythm sounds like he is 3. Dyspnea on exertion seems to be doing well from that point review. 4. Snoring with fatigue I suspect sleep apnea we will schedule him to have a sleep study. 5. Rheumatoid arthritis.  Stable   Medication Adjustments/Labs and Tests Ordered: Current medicines are reviewed at length with the patient today.  Concerns regarding medicines are outlined above.  No orders of the defined types were placed in this encounter.  Medication changes: No orders of the defined types were placed in this encounter.   Signed, Park Liter,  MD, [facility] 01/24/2018 10:57 AM    [place]

## 2018-01-24 NOTE — Patient Instructions (Signed)
Medication Instructions:  Your physician recommends that you continue on your current medications as directed. Please refer to the Current Medication list given to you today.  If you need a refill on your cardiac medications before your next appointment, please call your pharmacy.   Lab work: None.  If you have labs (blood work) drawn today and your tests are completely normal, you will receive your results only by: Marland Kitchen MyChart Message (if you have MyChart) OR . A paper copy in the mail If you have any lab test that is abnormal or we need to change your treatment, we will call you to review the results.  Testing/Procedures: Your physician has recommended that you have a sleep study. This test records several body functions during sleep, including: brain activity, eye movement, oxygen and carbon dioxide blood levels, heart rate and rhythm, breathing rate and rhythm, the flow of air through your mouth and nose, snoring, body muscle movements, and chest and belly movement.    Follow-Up: At [facility], you and your health needs are our priority.  As part of our continuing mission to provide you with exceptional heart care, we have created designated Provider Care Teams.  These Care Teams include your primary Cardiologist (physician) and Advanced Practice Providers (APPs -  Physician Assistants and Nurse Practitioners) who all work together to provide you with the care you need, when you need it. You will need a follow up appointment in 2 months.  Please call our office 2 months in advance to schedule this appointment.  You may see Jenne Campus, MD or another member of our [place] Provider Team in [place]: Shirlee More, MD . Jyl Heinz, MD  Any Other Special Instructions Will Be Listed Below (If Applicable).  Sleep Studies A sleep study (polysomnogram) is a series of tests done while you are sleeping. It can show how well you sleep. This can help your health care provider  diagnose a sleep disorder and show how severe your sleep disorder is. A sleep study may lead to treatment that will help you sleep better and prevent other medical problems caused by poor sleep. If you have a sleep disorder, you may also be at risk for:  Sleep-related accidents.  High blood pressure.  Heart disease.  Stroke.  Other medical conditions.  Sleep disorders are common. Your health care provider may suspect a sleep disorder if you:  Have loud snoring most nights.  Have brief periods when you stop breathing at night.  Feel sleepy on most days.  Fall asleep suddenly during the day.  Have trouble falling asleep or staying asleep.  Feel like you need to move your legs when trying to fall asleep.  Have dreams that seem very real shortly after falling asleep.  Feel like you cannot move when you first wake up.  Which tests will I need to have? Most sleep studies last all night and include these tests:  Recordings of your brain activity.  Recordings of your eye movements.  Recording of your heart rate and rhythm.  Blood pressure readings.  Readings of the amount of oxygen in your blood.  Measurements of your chest and belly movement as you breathe during sleep.  If you have signs of the sleep disorder called sleep apnea during your test, you may get a mask to wear for the second half of the night.  The mask provides continuous positive airway pressure (CPAP). This may improve sleep apnea significantly.  You will then have all tests done  again with the mask in place to see if your measurements and recordings change.  How are sleep studies done? Most sleep studies are done over one full night of sleep.  You will arrive at the study center in the evening and can go home in the morning.  Bring your pajamas and toothbrush.  Do not have caffeine on the day of your sleep study.  Your health care provider will let you know if you need to stop taking any of your  regular medicines before the test.  To do the tests included in a polysomnogram, you will have:  Round, sticky patches with sensors attached to recording wires (electrodes) placed on your scalp, face, chest, and limbs.  Wires from all the electrodes and sensors run from your bed to a computer. The wires can be taken off and put back on if you need to get out of bed to go to the bathroom.  A sensor placed over your nose to measure airflow.  A finger clip put on one finger to measure your blood oxygen level.  A belt around your belly and a belt around your chest to measure breathing movements.  Where are sleep studies done? Sleep studies are done at sleep centers. A sleep center may be inside a hospital, office, or clinic. The room where you have the study may look like a hospital room or a hotel room. The health care providers doing the study may come in and out of the room during the study. Most of the time, they will be in another room monitoring your test. How is information from sleep studies helpful? A polysomnogram can be used along with your medical history and a physical exam to diagnose conditions, such as:  Sleep apnea.  Restless legs syndrome.  Sleep-related seizure disorders.  Sleep-related movement disorders.  A medical doctor who specializes in sleep will evaluate your sleep study. The specialist will share the results with your primary health care provider. Treatments based on your sleep study may include:  Improving your sleep habits (sleep hygiene).  Wearing a CPAP mask.  Wearing an oral device at night to improve breathing and reduce snoring.  Taking medicine for: ? Restless legs syndrome. ? Sleep-related seizure disorder. ? Sleep-related movement disorder.  This information is not intended to replace advice given to you by your health care provider. Make sure you discuss any questions you have with your health care provider. Document Released: 08/29/2002  Document Revised: 10/19/2015 Document Reviewed: 04/30/2013 Elsevier Interactive Patient Education  [employer].

## 2018-01-25 ENCOUNTER — Telehealth: Payer: Self-pay | Admitting: *Deleted

## 2018-01-25 NOTE — Telephone Encounter (Signed)
Staff message sent to [facility] patient has Medicare and supplement and does not require a PA. Ok to schedule sleep study. Sleep lab contact information provided.

## 2018-01-25 NOTE — Telephone Encounter (Signed)
-----   Message from Ashok Norris, RN sent at 01/24/2018 11:15 AM EST ----- Regarding: please pre cert Please precert sleep study for snoring and fatigue per Dr. Agustin Cree.    Thanks  [employer] RN

## 2018-01-27 ENCOUNTER — Telehealth: Payer: Self-pay | Admitting: Emergency Medicine

## 2018-01-27 NOTE — Telephone Encounter (Signed)
Informed patient of sleep study scheduled for January 12th 2020 at 8pm. Patient verbally understands.

## 2018-02-09 DIAGNOSIS — J302 Other seasonal allergic rhinitis: Secondary | ICD-10-CM | POA: Diagnosis not present

## 2018-02-09 DIAGNOSIS — J209 Acute bronchitis, unspecified: Secondary | ICD-10-CM | POA: Diagnosis not present

## 2018-02-21 DIAGNOSIS — M0609 Rheumatoid arthritis without rheumatoid factor, multiple sites: Secondary | ICD-10-CM | POA: Diagnosis not present

## 2018-03-08 DIAGNOSIS — G4730 Sleep apnea, unspecified: Secondary | ICD-10-CM | POA: Insufficient documentation

## 2018-03-08 HISTORY — DX: Sleep apnea, unspecified: G47.30

## 2018-03-14 DIAGNOSIS — M545 Low back pain: Secondary | ICD-10-CM | POA: Diagnosis not present

## 2018-03-14 DIAGNOSIS — Z79899 Other long term (current) drug therapy: Secondary | ICD-10-CM | POA: Diagnosis not present

## 2018-03-14 DIAGNOSIS — M2550 Pain in unspecified joint: Secondary | ICD-10-CM | POA: Diagnosis not present

## 2018-03-14 DIAGNOSIS — M150 Primary generalized (osteo)arthritis: Secondary | ICD-10-CM | POA: Diagnosis not present

## 2018-03-14 DIAGNOSIS — M0609 Rheumatoid arthritis without rheumatoid factor, multiple sites: Secondary | ICD-10-CM | POA: Diagnosis not present

## 2018-03-14 DIAGNOSIS — E669 Obesity, unspecified: Secondary | ICD-10-CM | POA: Diagnosis not present

## 2018-03-14 DIAGNOSIS — Z6834 Body mass index (BMI) 34.0-34.9, adult: Secondary | ICD-10-CM | POA: Diagnosis not present

## 2018-03-19 ENCOUNTER — Ambulatory Visit (HOSPITAL_BASED_OUTPATIENT_CLINIC_OR_DEPARTMENT_OTHER): Payer: Medicare Other | Attending: Cardiology | Admitting: Cardiovascular Disease

## 2018-03-19 VITALS — Ht 74.0 in | Wt 256.0 lb

## 2018-03-19 DIAGNOSIS — R5383 Other fatigue: Secondary | ICD-10-CM | POA: Insufficient documentation

## 2018-03-19 DIAGNOSIS — R0683 Snoring: Secondary | ICD-10-CM | POA: Insufficient documentation

## 2018-03-19 DIAGNOSIS — G4733 Obstructive sleep apnea (adult) (pediatric): Secondary | ICD-10-CM

## 2018-03-19 DIAGNOSIS — G4730 Sleep apnea, unspecified: Secondary | ICD-10-CM

## 2018-03-21 DIAGNOSIS — Z79899 Other long term (current) drug therapy: Secondary | ICD-10-CM | POA: Diagnosis not present

## 2018-03-21 DIAGNOSIS — M0609 Rheumatoid arthritis without rheumatoid factor, multiple sites: Secondary | ICD-10-CM | POA: Diagnosis not present

## 2018-03-23 DIAGNOSIS — H43813 Vitreous degeneration, bilateral: Secondary | ICD-10-CM | POA: Diagnosis not present

## 2018-03-27 ENCOUNTER — Telehealth: Payer: Self-pay | Admitting: Cardiology

## 2018-03-27 DIAGNOSIS — I10 Essential (primary) hypertension: Secondary | ICD-10-CM

## 2018-03-27 NOTE — Telephone Encounter (Signed)
Please call MaryAnn at [facility] regarding a request to change one of the patients meds to another.Marland Kitchen

## 2018-03-27 NOTE — Telephone Encounter (Signed)
Called pharmacy she reports patient insurance will no longer pay for valsartan. However they will pay for losartan 100 mg tablets. Patient would like to know if we could change his valsartan 80 mg daily to another medication his insurance will cover. Will route to Dr. Geraldo Pitter in Dr. Wendy Poet absence.

## 2018-03-28 ENCOUNTER — Ambulatory Visit: Payer: Medicare Other | Admitting: Cardiology

## 2018-03-28 MED ORDER — LOSARTAN POTASSIUM 100 MG PO TABS: 100.0000 mg | ORAL_TABLET | Freq: Every day | ORAL | 0 refills | Status: DC

## 2018-03-28 NOTE — Telephone Encounter (Signed)
That is fine bmp in one week

## 2018-03-28 NOTE — Telephone Encounter (Signed)
yes

## 2018-03-28 NOTE — Telephone Encounter (Signed)
Just clarifying.. you are suggesting he stop valsartan 80 mg daily and start losartan 100 mg daily and have bmp in one week, correct?

## 2018-03-28 NOTE — Addendum Note (Signed)
Addended by: Linna Hoff R on: 03/28/2018 11:06 AM   Modules accepted: Orders

## 2018-03-28 NOTE — Telephone Encounter (Signed)
Per Dr. Geraldo Pitter patient informed to stop valsartan and start losartan 100 mg daily, and have labs checked in 1 week. Patient verbally understands.

## 2018-04-05 ENCOUNTER — Encounter (HOSPITAL_BASED_OUTPATIENT_CLINIC_OR_DEPARTMENT_OTHER): Payer: Self-pay | Admitting: Cardiovascular Disease

## 2018-04-05 ENCOUNTER — Telehealth: Payer: Self-pay | Admitting: *Deleted

## 2018-04-05 ENCOUNTER — Other Ambulatory Visit: Payer: Self-pay | Admitting: Cardiovascular Disease

## 2018-04-05 DIAGNOSIS — G4736 Sleep related hypoventilation in conditions classified elsewhere: Secondary | ICD-10-CM

## 2018-04-05 DIAGNOSIS — G4761 Periodic limb movement disorder: Secondary | ICD-10-CM

## 2018-04-05 DIAGNOSIS — G4733 Obstructive sleep apnea (adult) (pediatric): Secondary | ICD-10-CM

## 2018-04-05 DIAGNOSIS — IMO0002 Reserved for concepts with insufficient information to code with codable children: Secondary | ICD-10-CM

## 2018-04-05 NOTE — Procedures (Signed)
Patient Name: Brett Rosales, Brett Rosales Date: 03/19/2018 Gender: Male D.O.B: 1945/08/10 Age (years): 63 Referring Provider: Park Liter Height (inches): 97 Interpreting Physician: Shelva Majestic MD, ABSM Weight (lbs): 256 RPSGT: Baxter Flattery BMI: 35 MRN: [id number] Neck Size: 17.50  CLINICAL INFORMATION Sleep Study Type: NPSG  Indication for sleep study: Fatigue, Snoring, Witnesses Apnea / Gasping During Sleep  Epworth Sleepiness Score: 4  SLEEP STUDY TECHNIQUE As per the AASM Manual for the Scoring of Sleep and Associated Events v2.3 (April 2016) with a hypopnea requiring 4% desaturations.  The channels recorded and monitored were frontal, central and occipital EEG, electrooculogram (EOG), submentalis EMG (chin), nasal and oral airflow, thoracic and abdominal wall motion, anterior tibialis EMG, snore microphone, electrocardiogram, and pulse oximetry.  MEDICATIONS     DULoxetine (CYMBALTA) 30 MG capsule         ezetimibe (ZETIA) 10 MG tablet         folic acid (FOLVITE) 1 MG tablet         furosemide (LASIX) 20 MG tablet (Expired)         losartan (COZAAR) 100 MG tablet         Methotrexate, Anti-Rheumatic, (METHOTREXATE, PF, [place])         methylphenidate (RITALIN) 20 MG tablet         metoprolol tartrate (LOPRESSOR) 25 MG tablet         Tocilizumab (ACTEMRA IV)         traMADol (ULTRAM) 50 MG tablet              Medications self-administered by patient taken the night of the study : N/A  SLEEP ARCHITECTURE The study was initiated at 10:25:15 PM and ended at 5:02:21 AM.  Sleep onset time was 89.0 minutes and the sleep efficiency was 35.4%%. The total sleep time was 140.6 minutes.  Stage REM latency was N/A minutes.  The patient spent 5.7%% of the night in stage N1 sleep, 94.3%% in stage N2 sleep, 0.0%% in stage N3 and 0% in REM.  Alpha intrusion was absent.  Supine sleep was 44.02%.  RESPIRATORY PARAMETERS The overall apnea/hypopnea index (AHI) was 55.1 per  hour. The repiratory disturbance index (RDI) was 59.9/h. There were 115 total apneas, including 115 obstructive, 0 central and 0 mixed apneas. There were 14 hypopneas and 9 RERAs.  The AHI during Stage REM sleep was N/A per hour.  AHI while supine was 62.1 per hour.  The mean oxygen saturation was 92.5%. The minimum SpO2 during sleep was 86.0%.  Loud snoring was noted during this study.  CARDIAC DATA The 2 lead EKG demonstrated sinus rhythm. The mean heart rate was 47.6 beats per minute. Other EKG findings include: Atrial Fibrillation.  LEG MOVEMENT DATA The total PLMS were 0 with a resulting PLMS index of 0.0. Associated arousal with leg movement index was 8.5 .  IMPRESSIONS - Severe obstructive sleep apnea occurred during this study (AHI  55.1/h). REM sleep was not achieved. - No significant central sleep apnea occurred during this study (CAI = 0.0/h). - Mild oxygen desaturation to a nadir of 86.0% with NREM sleep. - Reduced sleep efficiency at 35.4%. - Abnormal sleep architecture with absence of slow wave sleep and REM sleep. - The patient snored with loud snoring volume. - EKG findings include Atrial Fibrillation. - Significant periodic limb movements at 127 with and index of 54.2. Associated arousals were significant with an index of 8.5.  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 - Efforts should be made to optimize nasal and oropharyngeal patency. - Positional therapy avoiding supine position during sleep. - If patient is symptomatic with restless legs after CPAP therapy consider a trial of pharmacotherapy for treatment of Periodic Leg Movements of Sleep. - Avoid alcohol, sedatives and other CNS depressants that may worsen  sleep apnea and disrupt normal sleep architecture. - Sleep hygiene should be reviewed to assess factors that may improve sleep quality. - Weight management (BMI 35) and regular exercise should be initiated or continued if appropriate.  [Electronically signed] 04/05/2018 07:48 AM  Shelva Majestic MD, [facility], [place], American Board of Sleep Medicine   NPI: [id number]  [place] PH: [phone]   FX: [phone] [place]

## 2018-04-05 NOTE — Telephone Encounter (Signed)
Left message with patient's wife to have patient to call to discuss sleep study results and recommendations. (ok per DPR)

## 2018-04-06 NOTE — Telephone Encounter (Signed)
Patient notified

## 2018-04-10 DIAGNOSIS — M1612 Unilateral primary osteoarthritis, left hip: Secondary | ICD-10-CM | POA: Diagnosis not present

## 2018-04-13 DIAGNOSIS — M1612 Unilateral primary osteoarthritis, left hip: Secondary | ICD-10-CM | POA: Diagnosis not present

## 2018-04-13 DIAGNOSIS — M25552 Pain in left hip: Secondary | ICD-10-CM | POA: Diagnosis not present

## 2018-04-18 DIAGNOSIS — M0609 Rheumatoid arthritis without rheumatoid factor, multiple sites: Secondary | ICD-10-CM | POA: Diagnosis not present

## 2018-04-21 ENCOUNTER — Encounter: Payer: Self-pay | Admitting: Cardiology

## 2018-04-21 ENCOUNTER — Ambulatory Visit (INDEPENDENT_AMBULATORY_CARE_PROVIDER_SITE_OTHER): Payer: Medicare Other | Admitting: Cardiology

## 2018-04-21 VITALS — BP 110/64 | HR 64 | Wt 261.6 lb

## 2018-04-21 DIAGNOSIS — I712 Thoracic aortic aneurysm, without rupture: Secondary | ICD-10-CM | POA: Diagnosis not present

## 2018-04-21 DIAGNOSIS — R0609 Other forms of dyspnea: Secondary | ICD-10-CM | POA: Diagnosis not present

## 2018-04-21 DIAGNOSIS — I483 Typical atrial flutter: Secondary | ICD-10-CM

## 2018-04-21 DIAGNOSIS — R0600 Dyspnea, unspecified: Secondary | ICD-10-CM

## 2018-04-21 DIAGNOSIS — I10 Essential (primary) hypertension: Secondary | ICD-10-CM

## 2018-04-21 DIAGNOSIS — I7121 Aneurysm of the ascending aorta, without rupture: Secondary | ICD-10-CM

## 2018-04-21 NOTE — Patient Instructions (Signed)
Medication Instructions:  Your physician recommends that you continue on your current medications as directed. Please refer to the Current Medication list given to you today.  If you need a refill on your cardiac medications before your next appointment, please call your pharmacy.   Lab work: None.  If you have labs (blood work) drawn today and your tests are completely normal, you will receive your results only by: Marland Kitchen MyChart Message (if you have MyChart) OR . A paper copy in the mail If you have any lab test that is abnormal or we need to change your treatment, we will call you to review the results.  Testing/Procedures: None.   Follow-Up: At [facility], you and your health needs are our priority.  As part of our continuing mission to provide you with exceptional heart care, we have created designated Provider Care Teams.  These Care Teams include your primary Cardiologist (physician) and Advanced Practice Providers (APPs -  Physician Assistants and Nurse Practitioners) who all work together to provide you with the care you need, when you need it. You will need a follow up appointment in 5 months.  Please call our office 2 months in advance to schedule this appointment.  You may see Jenne Campus, MD or another member of our [place] Provider Team in [place]: Jenne Campus, MD . Shirlee More, MD  Any Other Special Instructions Will Be Listed Below (If Applicable).

## 2018-04-21 NOTE — Progress Notes (Signed)
Cardiology Office Note:    Date:  04/21/2018   ID:  Brett Rosales, DOB [birth year], MRN [id number]  PCP:  Ronita Hipps, MD  Cardiologist:  Jenne Campus, MD    Referring MD: Ronita Hipps, MD   Chief Complaint  Patient presents with  . 2 month follow up  Doing very well  History of Present Illness:    Brett Rosales is a 73 y.o. male with well documented episode of typical atrial flutter.  He was anticoagulated his [facility] he stopped anticoagulation herself and said he preferred to be without it.  We had some discussion about this he prefer to be with no anticoagulation.  With his EKG today his EKG showed normal sinus rhythm.  Past Medical History:  Diagnosis Date  . Anxiety   . Arthritis   . Hypertension     Past Surgical History:  Procedure Laterality Date  . TONSILLECTOMY AND ADENOIDECTOMY      Current Medications: Current Meds  Medication Sig  . DULoxetine (CYMBALTA) 30 MG capsule Take 30 mg by mouth daily.  Marland Kitchen ezetimibe (ZETIA) 10 MG tablet Take 10 mg by mouth daily.  . folic acid (FOLVITE) 1 MG tablet Take 1 mg by mouth daily.  . furosemide (LASIX) 20 MG tablet Take 1 tablet (20 mg total) by mouth daily.  Marland Kitchen losartan (COZAAR) 100 MG tablet Take 1 tablet (100 mg total) by mouth daily.  . Methotrexate, Anti-Rheumatic, (METHOTREXATE, PF, [place]) Inject 1 mL into the skin once a week.  . methylphenidate (RITALIN) 20 MG tablet Take 20 mg by mouth 3 (three) times daily with meals.  . metoprolol tartrate (LOPRESSOR) 25 MG tablet Take 1 tablet (25 mg total) by mouth 2 (two) times daily.  . Tocilizumab (ACTEMRA IV) Inject into the vein.  Marland Kitchen traMADol (ULTRAM) 50 MG tablet Take 50 mg by mouth every 6 (six) hours as needed.     Allergies:   Nsaids and Statins   Social History   Socioeconomic History  . Marital status: Married    Spouse name: Not on file  . Number of children: Not on file  . Years of education: Not on file  . Highest education level: Not on  file  Occupational History  . Not on file  Social Needs  . Financial resource strain: Not on file  . Food insecurity:    Worry: Not on file    Inability: Not on file  . Transportation needs:    Medical: Not on file    Non-medical: Not on file  Tobacco Use  . Smoking status: Never Smoker  . Smokeless tobacco: Never Used  Substance and Sexual Activity  . Alcohol use: No    Alcohol/week: 0.0 standard drinks  . Drug use: No  . Sexual activity: Not on file  Lifestyle  . Physical activity:    Days per week: Not on file    Minutes per session: Not on file  . Stress: Not on file  Relationships  . Social connections:    Talks on phone: Not on file    Gets together: Not on file    Attends religious service: Not on file    Active member of club or organization: Not on file    Attends meetings of clubs or organizations: Not on file    Relationship status: Not on file  Other Topics Concern  . Not on file  Social History Narrative  . Not on file     Family History:  The patient's family history includes Cancer in his mother; Diabetes in his mother; Hypertension in his father. ROS:   Please see the history of present illness.    All 14 point review of systems negative except as described per history of present illness  EKGs/Labs/Other Studies Reviewed:      Recent Labs: No results found for requested labs within last 8760 hours.  Recent Lipid Panel No results found for: CHOL, TRIG, HDL, CHOLHDL, VLDL, LDLCALC, LDLDIRECT  Physical Exam:    VS:  BP 110/64   Pulse 64   Wt 261 lb 9.6 oz (118.7 kg)   SpO2 97%   BMI 33.59 kg/m     Wt Readings from Last 3 Encounters:  04/21/18 261 lb 9.6 oz (118.7 kg)  03/19/18 256 lb (116.1 kg)  01/24/18 266 lb 12.8 oz (121 kg)     GEN:  Well nourished, well developed in no acute distress HEENT: Normal NECK: No JVD; No carotid bruits LYMPHATICS: No lymphadenopathy CARDIAC: RRR, no murmurs, no rubs, no gallops RESPIRATORY:  Clear to  auscultation without rales, wheezing or rhonchi  ABDOMEN: Soft, non-tender, non-distended MUSCULOSKELETAL:  No edema; No deformity  SKIN: Warm and dry LOWER EXTREMITIES: no swelling NEUROLOGIC:  Alert and oriented x 3 PSYCHIATRIC:  Normal affect   ASSESSMENT:    1. Typical atrial flutter ([place])   2. Ascending aortic aneurysm ([place])   3. Dyspnea on exertion   4. Essential hypertension    PLAN:    In order of problems listed above:  1. Typical atrial flutter maintaining sinus rhythm we will continue present management.  Standing aortic aneurysm we will continue monitoring.  Stable. 2. Dyspnea on exertion denies having any. 3. Essential hypertension blood pressure well controlled continue present management   Medication Adjustments/Labs and Tests Ordered: Current medicines are reviewed at length with the patient today.  Concerns regarding medicines are outlined above.  No orders of the defined types were placed in this encounter.  Medication changes: No orders of the defined types were placed in this encounter.   Signed, Park Liter, MD, [facility] 04/21/2018 10:55 AM    [place]

## 2018-05-16 ENCOUNTER — Telehealth: Payer: Self-pay | Admitting: Cardiology

## 2018-05-16 DIAGNOSIS — M0609 Rheumatoid arthritis without rheumatoid factor, multiple sites: Secondary | ICD-10-CM | POA: Diagnosis not present

## 2018-05-16 NOTE — Telephone Encounter (Signed)
Patient called back and stated that he has been having swelling, redness, and pain in his left leg. He also admits to having shortness of breath. He was advised by his Rheumatologist to call the office and let us know his symptoms especially since his Xarelto has been stopped.

## 2018-05-16 NOTE — Telephone Encounter (Signed)
Left message for patient to return call.

## 2018-05-16 NOTE — Telephone Encounter (Signed)
Reviewed patient's symptoms with Dr. Agustin Cree. He advised the patient he would like to see the patient tomorrow. I have contacted Jyaire and scheduled him for tomorrow at 9:40.

## 2018-05-16 NOTE — Telephone Encounter (Signed)
Patient is requesting a call back, needing advice

## 2018-05-17 ENCOUNTER — Ambulatory Visit (INDEPENDENT_AMBULATORY_CARE_PROVIDER_SITE_OTHER): Payer: Medicare Other | Admitting: Cardiology

## 2018-05-17 ENCOUNTER — Encounter (HOSPITAL_BASED_OUTPATIENT_CLINIC_OR_DEPARTMENT_OTHER): Payer: Medicare Other

## 2018-05-17 ENCOUNTER — Encounter (HOSPITAL_COMMUNITY): Payer: Medicare Other

## 2018-05-17 ENCOUNTER — Other Ambulatory Visit: Payer: Self-pay

## 2018-05-17 VITALS — BP 110/64 | HR 61 | Ht 74.0 in | Wt 260.0 lb

## 2018-05-17 DIAGNOSIS — I712 Thoracic aortic aneurysm, without rupture: Secondary | ICD-10-CM

## 2018-05-17 DIAGNOSIS — I483 Typical atrial flutter: Secondary | ICD-10-CM

## 2018-05-17 DIAGNOSIS — M7989 Other specified soft tissue disorders: Secondary | ICD-10-CM

## 2018-05-17 DIAGNOSIS — I10 Essential (primary) hypertension: Secondary | ICD-10-CM | POA: Diagnosis not present

## 2018-05-17 DIAGNOSIS — I7121 Aneurysm of the ascending aorta, without rupture: Secondary | ICD-10-CM

## 2018-05-17 DIAGNOSIS — M79662 Pain in left lower leg: Secondary | ICD-10-CM | POA: Diagnosis not present

## 2018-05-17 HISTORY — DX: Other specified soft tissue disorders: M79.89

## 2018-05-17 MED ORDER — RIVAROXABAN 20 MG PO TABS: 20.0000 mg | ORAL_TABLET | Freq: Every day | ORAL | 1 refills | Status: DC

## 2018-05-17 NOTE — Progress Notes (Signed)
Cardiology Office Note:    Date:  05/17/2018   ID:  Brett Rosales, DOB 1945/09/20, MRN [id number]  PCP:  Ronita Hipps, MD  Cardiologist:  Jenne Campus, MD    Referring MD: Ronita Hipps, MD   Chief Complaint  Patient presents with   Leg Swelling    with redness and warmth   Shortness of Breath  Of swelling of left lower extremity  History of Present Illness:    Brett Rosales is a 73 y.o. male with atrial flutter comes today to my office and requested to be seen because of swelling of left lower extremities started before weekend today is Wednesday it was red hot and painful and swollen likely to improve quite significantly there is no calf tenderness and overall swelling is only minimal at the moment.  We had a long discussion about what to do with the situation I still think it would be reasonable to rule out DVT he does have atrial flutter which is paroxysmal he used to be anticoagulated but because of price decided not to do it today he approached me again with a question does he needed we calculated chads 2 Vascor which equals 2 and I recommended to restart anticoagulation will give him samples of Xarelto 20 and will send prescription for it.  Past Medical History:  Diagnosis Date   Anxiety    Arthritis    Hypertension     Past Surgical History:  Procedure Laterality Date   TONSILLECTOMY AND ADENOIDECTOMY      Current Medications: Current Meds  Medication Sig   DULoxetine (CYMBALTA) 30 MG capsule Take 30 mg by mouth daily.   ezetimibe (ZETIA) 10 MG tablet Take 10 mg by mouth daily.   folic acid (FOLVITE) 1 MG tablet Take 1 mg by mouth daily.   furosemide (LASIX) 20 MG tablet Take 1 tablet (20 mg total) by mouth daily.   losartan (COZAAR) 100 MG tablet Take 1 tablet (100 mg total) by mouth daily.   Methotrexate, Anti-Rheumatic, (METHOTREXATE, PF, [place]) Inject 1 mL into the skin once a week.   methylphenidate (RITALIN) 20 MG tablet Take 20 mg by mouth 3 (three)  times daily with meals.   metoprolol tartrate (LOPRESSOR) 25 MG tablet Take 1 tablet (25 mg total) by mouth 2 (two) times daily.   Tocilizumab (ACTEMRA IV) Inject into the vein.   traMADol (ULTRAM) 50 MG tablet Take 50 mg by mouth every 6 (six) hours as needed.     Allergies:   Nsaids and Statins   Social History   Socioeconomic History   Marital status: Married    Spouse name: Not on file   Number of children: Not on file   Years of education: Not on file   Highest education level: Not on file  Occupational History   Not on file  Social Needs   Financial resource strain: Not on file   Food insecurity:    Worry: Not on file    Inability: Not on file   Transportation needs:    Medical: Not on file    Non-medical: Not on file  Tobacco Use   Smoking status: Never Smoker   Smokeless tobacco: Never Used  Substance and Sexual Activity   Alcohol use: No    Alcohol/week: 0.0 standard drinks   Drug use: No   Sexual activity: Not on file  Lifestyle   Physical activity:    Days per week: Not on file    Minutes per session: Not  on file   Stress: Not on file  Relationships   Social connections:    Talks on phone: Not on file    Gets together: Not on file    Attends religious service: Not on file    Active member of club or organization: Not on file    Attends meetings of clubs or organizations: Not on file    Relationship status: Not on file  Other Topics Concern   Not on file  Social History Narrative   Not on file     Family History: The patient's family history includes Cancer in his mother; Diabetes in his mother; Hypertension in his father. ROS:   Please see the history of present illness.    All 14 point review of systems negative except as described per history of present illness  EKGs/Labs/Other Studies Reviewed:      Recent Labs: No results found for requested labs within last 8760 hours.  Recent Lipid Panel No results found for:  CHOL, TRIG, HDL, CHOLHDL, VLDL, LDLCALC, LDLDIRECT  Physical Exam:    VS:  BP 110/64    Pulse 61    Ht 6\' 2"  (1.88 m)    Wt 260 lb (117.9 kg)    SpO2 94%    BMI 33.38 kg/m     Wt Readings from Last 3 Encounters:  05/17/18 260 lb (117.9 kg)  04/21/18 261 lb 9.6 oz (118.7 kg)  03/19/18 256 lb (116.1 kg)     GEN:  Well nourished, well developed in no acute distress HEENT: Normal NECK: No JVD; No carotid bruits LYMPHATICS: No lymphadenopathy CARDIAC: RRR, no murmurs, no rubs, no gallops RESPIRATORY:  Clear to auscultation without rales, wheezing or rhonchi  ABDOMEN: Soft, non-tender, non-distended MUSCULOSKELETAL:  No edema; No deformity  SKIN: Warm and dry LOWER EXTREMITIES: Minimal swelling in left lower extremities and no pain no redness NEUROLOGIC:  Alert and oriented x 3 PSYCHIATRIC:  Normal affect   ASSESSMENT:    1. Typical atrial flutter ([place])   2. Essential hypertension   3. Ascending aortic aneurysm ([place])   4. Swelling of left lower extremity    PLAN:    In order of problems listed above:  1. Typical atrial flutter he converted to sinus rhythm we will do EKG to date to confirm the rhythm he did suffer from bronchitis that he is recovering from. 2. Central hypertension blood pressure controlled continue present management. 3. Aortic aneurysm noted.  We will continue monitoring 4. Asymmetrical swelling of left lower extremities will do Vascor ultrasounds looking for DVT   Medication Adjustments/Labs and Tests Ordered: Current medicines are reviewed at length with the patient today.  Concerns regarding medicines are outlined above.  No orders of the defined types were placed in this encounter.  Medication changes: No orders of the defined types were placed in this encounter.   Signed, Park Liter, MD, [facility] 05/17/2018 10:29 AM    [place]

## 2018-05-17 NOTE — Patient Instructions (Addendum)
Medication Instructions:  Your physician has recommended you make the following change in your medication:  Start: Xarelto 20 mg daily.   If you need a refill on your cardiac medications before your next appointment, please call your pharmacy.   Lab work: None.  If you have labs (blood work) drawn today and your tests are completely normal, you will receive your results only by: Marland Kitchen MyChart Message (if you have MyChart) OR . A paper copy in the mail If you have any lab test that is abnormal or we need to change your treatment, we will call you to review the results.  Testing/Procedures: Your physician has requested that you have a lower or upper extremity venous duplex. This test is an ultrasound of the veins in the legs or arms. It looks at venous blood flow that carries blood from the heart to the legs or arms. Allow one hour for a Lower Venous exam. Allow thirty minutes for an Upper Venous exam. There are no restrictions or special instructions.     Follow-Up: At [facility], you and your health needs are our priority.  As part of our continuing mission to provide you with exceptional heart care, we have created designated Provider Care Teams.  These Care Teams include your primary Cardiologist (physician) and Advanced Practice Providers (APPs -  Physician Assistants and Nurse Practitioners) who all work together to provide you with the care you need, when you need it. You will need a follow up appointment in 3 months.  Please call our office 2 months in advance to schedule this appointment.  You may see Jenne Campus, MD or another member of our [place] Provider Team in [place]: Shirlee More, MD . Jyl Heinz, MD  Any Other Special Instructions Will Be Listed Below (If Applicable).   Rivaroxaban oral tablets What is this medicine? RIVAROXABAN (ri va ROX a ban) is an anticoagulant (blood thinner). It is used to treat blood clots in the lungs or in the veins. It is  also used after knee or hip surgeries to prevent blood clots. It is also used to lower the chance of stroke in people with a medical condition called atrial fibrillation. This medicine may be used for other purposes; ask your health care provider or pharmacist if you have questions. COMMON BRAND NAME(S): Xarelto, Xarelto Starter Pack What should I tell my health care provider before I take this medicine? They need to know if you have any of these conditions: -bleeding disorders -bleeding in the brain -blood in your stools (black or tarry stools) or if you have blood in your vomit -history of stomach bleeding -kidney disease -liver disease -low blood counts, like low white cell, platelet, or red cell counts -recent or planned spinal or epidural procedure -take medicines that treat or prevent blood clots -an unusual or allergic reaction to rivaroxaban, other medicines, foods, dyes, or preservatives -pregnant or trying to get pregnant -breast-feeding How should I use this medicine? Take this medicine by mouth with a glass of water. Follow the directions on the prescription label. Take your medicine at regular intervals. Do not take it more often than directed. Do not stop taking except on your doctor's advice. Stopping this medicine may increase your risk of a blood clot. Be sure to refill your prescription before you run out of medicine. If you are taking this medicine after hip or knee replacement surgery, take it with or without food. If you are taking this medicine for atrial fibrillation, take  it with your evening meal. If you are taking this medicine to treat blood clots, take it with food at the same time each day. If you are unable to swallow your tablet, you may crush the tablet and mix it in applesauce. Then, immediately eat the applesauce. You should eat more food right after you eat the applesauce containing the crushed tablet. Talk to your pediatrician regarding the use of this medicine  in children. Special care may be needed. Overdosage: If you think you have taken too much of this medicine contact a poison control center or emergency room at once. NOTE: This medicine is only for you. Do not share this medicine with others. What if I miss a dose? If you take your medicine once a day and miss a dose, take the missed dose as soon as you remember. If it is almost time for your next dose, take only that dose. Do not take double or extra doses. If you take your medicine twice a day and miss a dose, take the missed dose immediately. In this instance, 2 tablets may be taken at the same time. The next day you should take 1 tablet twice a day as directed. What may interact with this medicine? Do not take this medicine with any of the following medications: -defibrotide This medicine may also interact with the following medications: -aspirin and aspirin-like medicines -certain antibiotics like erythromycin, azithromycin, and clarithromycin -certain medicines for fungal infections like ketoconazole and itraconazole -certain medicines for irregular heart beat like amiodarone, quinidine, dronedarone -certain medicines for seizures like carbamazepine, phenytoin -certain medicines that treat or prevent blood clots like warfarin, enoxaparin, and dalteparin -conivaptan -felodipine -indinavir -lopinavir; ritonavir -NSAIDS, medicines for pain and inflammation, like ibuprofen or naproxen -ranolazine -rifampin -ritonavir -SNRIs, medicines for depression, like desvenlafaxine, duloxetine, levomilnacipran, venlafaxine -SSRIs, medicines for depression, like citalopram, escitalopram, fluoxetine, fluvoxamine, paroxetine, sertraline -St. John's wort -verapamil This list may not describe all possible interactions. Give your health care provider a list of all the medicines, herbs, non-prescription drugs, or dietary supplements you use. Also tell them if you smoke, drink alcohol, or use illegal  drugs. Some items may interact with your medicine. What should I watch for while using this medicine? Visit your healthcare professional for regular checks on your progress. You may need blood work done while you are taking this medicine. Your condition will be monitored carefully while you are receiving this medicine. It is important not to miss any appointments. Avoid sports and activities that might cause injury while you are using this medicine. Severe falls or injuries can cause unseen bleeding. Be careful when using sharp tools or knives. Consider using an Copy. Take special care brushing or flossing your teeth. Report any injuries, bruising, or red spots on the skin to your healthcare professional. If you are going to need surgery or other procedure, tell your healthcare professional that you are taking this medicine. Wear a medical ID bracelet or chain. Carry a card that describes your disease and details of your medicine and dosage times. What side effects may I notice from receiving this medicine? Side effects that you should report to your doctor or health care professional as soon as possible: -allergic reactions like skin rash, itching or hives, swelling of the face, lips, or tongue -back pain -redness, blistering, peeling or loosening of the skin, including inside the mouth -signs and symptoms of bleeding such as bloody or black, tarry stools; red or dark-brown urine; spitting up blood or brown material  that looks like coffee grounds; red spots on the skin; unusual bruising or bleeding from the eye, gums, or nose -signs and symptoms of a blood clot such as chest pain; shortness of breath; pain, swelling, or warmth in the leg -signs and symptoms of a stroke such as changes in vision; confusion; trouble speaking or understanding; severe headaches; sudden numbness or weakness of the face, arm or leg; trouble walking; dizziness; loss of coordination Side effects that usually do not  require medical attention (report to your doctor or health care professional if they continue or are bothersome): -dizziness -muscle pain This list may not describe all possible side effects. Call your doctor for medical advice about side effects. You may report side effects to FDA at 1-800-FDA-1088. Where should I keep my medicine? Keep out of the reach of children. Store at room temperature between 15 and 30 degrees C (59 and 86 degrees F). Throw away any unused medicine after the expiration date. NOTE: This sheet is a summary. It may not cover all possible information. If you have questions about this medicine, talk to your doctor, pharmacist, or health care provider.  2019 Elsevier/Gold Standard (2017-02-17 11:37:12)

## 2018-05-19 ENCOUNTER — Encounter: Payer: Self-pay | Admitting: Cardiology

## 2018-06-06 ENCOUNTER — Encounter

## 2018-06-06 ENCOUNTER — Encounter (HOSPITAL_BASED_OUTPATIENT_CLINIC_OR_DEPARTMENT_OTHER): Payer: Medicare Other

## 2018-06-06 DIAGNOSIS — M1612 Unilateral primary osteoarthritis, left hip: Secondary | ICD-10-CM | POA: Diagnosis not present

## 2018-06-15 DIAGNOSIS — M0609 Rheumatoid arthritis without rheumatoid factor, multiple sites: Secondary | ICD-10-CM | POA: Diagnosis not present

## 2018-06-15 DIAGNOSIS — Z79899 Other long term (current) drug therapy: Secondary | ICD-10-CM | POA: Diagnosis not present

## 2018-07-13 DIAGNOSIS — Z0000 Encounter for general adult medical examination without abnormal findings: Secondary | ICD-10-CM | POA: Diagnosis not present

## 2018-07-13 DIAGNOSIS — F329 Major depressive disorder, single episode, unspecified: Secondary | ICD-10-CM | POA: Diagnosis not present

## 2018-07-13 DIAGNOSIS — E785 Hyperlipidemia, unspecified: Secondary | ICD-10-CM | POA: Diagnosis not present

## 2018-07-13 DIAGNOSIS — I10 Essential (primary) hypertension: Secondary | ICD-10-CM | POA: Diagnosis not present

## 2018-07-13 DIAGNOSIS — M0609 Rheumatoid arthritis without rheumatoid factor, multiple sites: Secondary | ICD-10-CM | POA: Diagnosis not present

## 2018-07-13 DIAGNOSIS — Z79899 Other long term (current) drug therapy: Secondary | ICD-10-CM | POA: Diagnosis not present

## 2018-07-16 ENCOUNTER — Encounter (HOSPITAL_BASED_OUTPATIENT_CLINIC_OR_DEPARTMENT_OTHER): Payer: Medicare Other

## 2018-08-04 DIAGNOSIS — M1612 Unilateral primary osteoarthritis, left hip: Secondary | ICD-10-CM | POA: Diagnosis not present

## 2018-08-04 DIAGNOSIS — M25552 Pain in left hip: Secondary | ICD-10-CM

## 2018-08-04 HISTORY — DX: Pain in left hip: M25.552

## 2018-08-07 ENCOUNTER — Telehealth: Payer: Self-pay | Admitting: *Deleted

## 2018-08-07 NOTE — Telephone Encounter (Signed)
Called patient back. He reports he has surgery clearance paperwork for him to have hip replacement surgery and needs Dr. Agustin Cree to sign off on them. He will be bringing them by today for [place] to look out.

## 2018-08-07 NOTE — Telephone Encounter (Signed)
Pt would like a call about upcoming procedure please.

## 2018-08-10 ENCOUNTER — Other Ambulatory Visit: Payer: Self-pay

## 2018-08-10 DIAGNOSIS — M0609 Rheumatoid arthritis without rheumatoid factor, multiple sites: Secondary | ICD-10-CM | POA: Diagnosis not present

## 2018-08-10 MED ORDER — METOPROLOL TARTRATE 25 MG PO TABS: 25.0000 mg | ORAL_TABLET | Freq: Two times a day (BID) | ORAL | 1 refills | Status: DC

## 2018-08-21 ENCOUNTER — Encounter: Payer: Self-pay | Admitting: Cardiology

## 2018-08-21 ENCOUNTER — Telehealth (INDEPENDENT_AMBULATORY_CARE_PROVIDER_SITE_OTHER): Payer: Medicare Other | Admitting: Cardiology

## 2018-08-21 ENCOUNTER — Other Ambulatory Visit: Payer: Self-pay

## 2018-08-21 DIAGNOSIS — I7121 Aneurysm of the ascending aorta, without rupture: Secondary | ICD-10-CM

## 2018-08-21 DIAGNOSIS — R0609 Other forms of dyspnea: Secondary | ICD-10-CM

## 2018-08-21 DIAGNOSIS — M069 Rheumatoid arthritis, unspecified: Secondary | ICD-10-CM

## 2018-08-21 DIAGNOSIS — I483 Typical atrial flutter: Secondary | ICD-10-CM | POA: Diagnosis not present

## 2018-08-21 DIAGNOSIS — I712 Thoracic aortic aneurysm, without rupture: Secondary | ICD-10-CM

## 2018-08-21 DIAGNOSIS — R0600 Dyspnea, unspecified: Secondary | ICD-10-CM

## 2018-08-21 NOTE — Patient Instructions (Signed)
Medication Instructions:  Your physician recommends that you continue on your current medications as directed. Please refer to the Current Medication list given to you today.  If you need a refill on your cardiac medications before your next appointment, please call your pharmacy.   Lab work: None.  If you have labs (blood work) drawn today and your tests are completely normal, you will receive your results only by: . MyChart Message (if you have MyChart) OR . A paper copy in the mail If you have any lab test that is abnormal or we need to change your treatment, we will call you to review the results.  Testing/Procedures: None.    Follow-Up: At [facility], you and your health needs are our priority.  As part of our continuing mission to provide you with exceptional heart care, we have created designated Provider Care Teams.  These Care Teams include your primary Cardiologist (physician) and Advanced Practice Providers (APPs -  Physician Assistants and Nurse Practitioners) who all work together to provide you with the care you need, when you need it. You will need a follow up appointment in 5 months.  Please call our office 2 months in advance to schedule this appointment.  You may see Robert Krasowski, MD or another member of our [facility] Provider Team in [place]: Brian Munley, MD . Rajan Revankar, MD  Any Other Special Instructions Will Be Listed Below (If Applicable).     

## 2018-08-21 NOTE — Progress Notes (Signed)
Virtual Visit via Telephone Note   This visit type was conducted due to national recommendations for restrictions regarding the COVID-19 Pandemic (e.g. social distancing) in an effort to limit this patient's exposure and mitigate transmission in our community.  Due to his co-morbid illnesses, this patient is at least at moderate risk for complications without adequate follow up.  This format is felt to be most appropriate for this patient at this time.  The patient did not have access to video technology/had technical difficulties with video requiring transitioning to audio format only (telephone).  All issues noted in this document were discussed and addressed.  No physical exam could be performed with this format.  Please refer to the patient's chart for his  consent to telehealth for [facility].  Evaluation Performed:  Follow-up visit  This visit type was conducted due to national recommendations for restrictions regarding the COVID-19 Pandemic (e.g. social distancing).  This format is felt to be most appropriate for this patient at this time.  All issues noted in this document were discussed and addressed.  No physical exam was performed (except for noted visual exam findings with Video Visits).  Please refer to the patient's chart (MyChart message for video visits and phone note for telephone visits) for the patient's consent to telehealth for [facility].  Date:  08/21/2018  ID: Brett Rosales, DOB 02-21-1946, MRN [id number]   Patient Location: [place] [id number]   Provider location:   [place] [facility]  PCP:  Ronita Hipps, MD  Cardiologist:  Jenne Campus, MD     Chief Complaint: Doing well  History of Present Illness:    Brett Rosales is a 73 y.o. male  who presents via audio/video conferencing for a telehealth visit today.  With paroxysmal atrial flutter, anticoagulated, essential hypertension, supposedly ascending octagon was have her last  CT did not confirm that.  Overall he is doing very well denies having a palpitations.  No chest pain tightness squeezing pressure burning chest.  The biggest complaint he had is problem with his hip he is getting ready to have hip surgery done overall in spite of hip problem he is able to walk and climb stairs.  He said he can go to [place] or he can go to [employer] home improvement he can walk a lot around with no difficulties.  However if he did overdo it typically at evening time he will have a lot of pain in his hip.  Denies having any chest pain tightness squeezing pressure burning chest   The patient does not have symptoms concerning for COVID-19 infection (fever, chills, cough, or new SHORTNESS OF BREATH).    Prior CV studies:   The following studies were reviewed today:       Past Medical History:  Diagnosis Date  . Anxiety   . Arthritis   . Hypertension     Past Surgical History:  Procedure Laterality Date  . TONSILLECTOMY AND ADENOIDECTOMY       Current Meds  Medication Sig  . DULoxetine (CYMBALTA) 30 MG capsule Take 30 mg by mouth daily.  Marland Kitchen ezetimibe (ZETIA) 10 MG tablet Take 10 mg by mouth daily.  . folic acid (FOLVITE) 1 MG tablet Take 1 mg by mouth daily.  . furosemide (LASIX) 20 MG tablet Take 1 tablet (20 mg total) by mouth daily.  Marland Kitchen losartan (COZAAR) 100 MG tablet Take 1 tablet (100 mg total) by mouth daily.  . Methotrexate, Anti-Rheumatic, (METHOTREXATE, PF,  [place]) Inject 1 mL into the skin once a week.  . methylphenidate (RITALIN) 20 MG tablet Take 20 mg by mouth 3 (three) times daily with meals.  . metoprolol tartrate (LOPRESSOR) 25 MG tablet Take 1 tablet (25 mg total) by mouth 2 (two) times daily.  . rivaroxaban (XARELTO) 20 MG TABS tablet Take 1 tablet (20 mg total) by mouth daily with supper.  . Tocilizumab (ACTEMRA IV) Inject into the vein.  Marland Kitchen traMADol (ULTRAM) 50 MG tablet Take 50 mg by mouth every 6 (six) hours as needed.      Family History: The  patient's family history includes Cancer in his mother; Diabetes in his mother; Hypertension in his father.   ROS:   Please see the history of present illness.     All other systems reviewed and are negative.   Labs/Other Tests and Data Reviewed:     Recent Labs: No results found for requested labs within last 8760 hours.  Recent Lipid Panel No results found for: CHOL, TRIG, HDL, CHOLHDL, VLDL, LDLCALC, LDLDIRECT    Exam:    Vital Signs:  There were no vitals taken for this visit.    Wt Readings from Last 3 Encounters:  05/17/18 260 lb (117.9 kg)  04/21/18 261 lb 9.6 oz (118.7 kg)  03/19/18 256 lb (116.1 kg)     Well nourished, well developed in no acute distress. Alert awake oriented x3 we are unable to establish video link therefore we talk on the phone.  He is doing well  Diagnosis for this visit:   1. Typical atrial flutter ([place])   2. Ascending aortic aneurysm ([place])   3. Rheumatoid arthritis involving shoulder, unspecified laterality, unspecified rheumatoid factor presence ([place])   4. Dyspnea on exertion      ASSESSMENT & PLAN:    1.  Typical atrial flutter anticoagulated which I will continue.  Denies having a palpitation will continue present management. 2.  Last CT of the chest did not confirm that. 3.  Rheumatoid arthritis feeling much better with all medications that he continue taking. 4.  Dyspnea on exertion doing well from that point review 5.  He did not required hip surgery.  If needed his anticoagulation can be hold for 48 hours before surgery.  From cardiac standpoint of view he is an acceptable candidate for surgery.  In spite of chronic hip problem and pain he is still very mobile and he can do a lot therefore his exercise tolerance is quite good.  COVID-19 Education: The signs and symptoms of COVID-19 were discussed with the patient and how to seek care for testing (follow up with PCP or arrange E-visit).  The importance of social distancing was  discussed today.  Patient Risk:   After full review of this patients clinical status, I feel that they are at least moderate risk at this time.  Time:   Today, I have spent 17 minutes with the patient with telehealth technology discussing pt health issues.  I spent 5 minutes reviewing her chart before the visit.  Visit was finished at 10:08 AM.    Medication Adjustments/Labs and Tests Ordered: Current medicines are reviewed at length with the patient today.  Concerns regarding medicines are outlined above.  No orders of the defined types were placed in this encounter.  Medication changes: No orders of the defined types were placed in this encounter.    Disposition: Follow-up in 5 months  Signed, Park Liter, MD, [facility] 08/21/2018 10:09 AM  [facility]

## 2018-09-11 ENCOUNTER — Ambulatory Visit: Payer: Self-pay | Admitting: Orthopedic Surgery

## 2018-09-15 ENCOUNTER — Ambulatory Visit: Payer: Self-pay | Admitting: Orthopedic Surgery

## 2018-09-15 NOTE — H&P (Signed)
TOTAL HIP ADMISSION H&P  Patient is admitted for left total hip arthroplasty.  Subjective:  Chief Complaint: left hip pain  HPI: Brett Rosales, 73 y.o. male, has a history of pain and functional disability in the left hip(s) due to arthritis and patient has failed non-surgical conservative treatments for greater than 12 weeks to include NSAID's and/or analgesics, corticosteriod injections, flexibility and strengthening excercises, use of assistive devices, weight reduction as appropriate and activity modification.  Onset of symptoms was gradual starting 4 years ago with gradually worsening course since that time.The patient noted no past surgery on the left hip(s).  Patient currently rates pain in the left hip at 10 out of 10 with activity. Patient has night pain, worsening of pain with activity and weight bearing, pain that interfers with activities of daily living, pain with passive range of motion and crepitus. Patient has evidence of subchondral cysts, subchondral sclerosis, periarticular osteophytes and joint space narrowing by imaging studies. This condition presents safety issues increasing the risk of falls.   There is no current active infection.  Patient Active Problem List   Diagnosis Date Noted  . Swelling of left lower extremity 05/17/2018  . Ascending aortic aneurysm ([place]) 12/23/2017  . Typical atrial flutter ([place]) 12/16/2017  . Supraventricular tachycardia ([place]) 12/09/2017  . Essential hypertension 12/09/2017  . Dyspnea on exertion 12/09/2017  . Rheumatoid arthritis ([place]) 12/09/2017   Past Medical History:  Diagnosis Date  . Anxiety    medication made him feel flat stopped meds 09/07/2018  . Arthritis   . Depression   . Dysrhythmia 11/2017  . History of kidney stones    13 stones  . Hypertension   . Sleep apnea 03/2018   Had study after episode of disrhythmia. it was inconclusive. needs follow up    Past Surgical History:  Procedure Laterality Date  . TONSILLECTOMY AND  ADENOIDECTOMY    . VASECTOMY  1990    No current facility-administered medications for this visit.    No current outpatient medications on file.   Facility-Administered Medications Ordered in Other Visits  Medication Dose Route Frequency Provider Last Rate Last Dose  . 0.9 %  sodium chloride infusion   Intravenous Continuous Archie Atilano, Aaron Edelman, MD      . acetaminophen (OFIRMEV) IV 1,000 mg  1,000 mg Intravenous To OR Karnell Vanderloop, Aaron Edelman, MD      . ceFAZolin (ANCEF) IVPB 2g/100 mL premix  2 g Intravenous On Call to Carrizo Dorton, MD      . chlorhexidine (HIBICLENS) 4 % liquid 4 application  60 mL Topical Once Aryaan Persichetti, Aaron Edelman, MD      . chlorhexidine (HIBICLENS) 4 % liquid 4 application  60 mL Topical Once Rod Can, MD      . lactated ringers infusion   Intravenous Continuous Albertha Ghee, MD 50 mL/hr at 09/21/18 1151    . phenylephrine (NEOSYNEPHRINE) 10-0.9 MG/250ML-% infusion           . povidone-iodine 10 % swab 2 application  2 application Topical Once Labrina Lines, Aaron Edelman, MD      . tranexamic acid (CYKLOKAPRON) IVPB 1,000 mg  1,000 mg Intravenous To OR Rod Can, MD       Allergies  Allergen Reactions  . Nsaids Other (See Comments)    "Eats hole in stomach".Marland KitchenMarland KitchenMarland KitchenIbuprofen, Meloxicam...  . Statins Other (See Comments)    Myalgias    Social History   Tobacco Use  . Smoking status: Never Smoker  . Smokeless tobacco: Never Used  Substance Use  Topics  . Alcohol use: No    Alcohol/week: 0.0 standard drinks    Family History  Problem Relation Age of Onset  . Diabetes Mother   . Cancer Mother   . Hypertension Father      Review of Systems  Constitutional: Negative.   HENT: Negative.   Eyes: Positive for blurred vision.  Respiratory: Negative.   Cardiovascular: Negative.   Gastrointestinal: Negative.   Genitourinary: Negative.   Musculoskeletal: Positive for joint pain and myalgias.  Skin: Positive for itching.  Neurological: Negative.   Endo/Heme/Allergies:  Negative.   Psychiatric/Behavioral: Positive for depression and memory loss.    Objective:  Physical Exam  Vitals reviewed. Constitutional: He is oriented to person, place, and time. He appears well-developed and well-nourished.  HENT:  Head: Normocephalic and atraumatic.  Eyes: Pupils are equal, round, and reactive to light. Conjunctivae and EOM are normal.  Neck: Normal range of motion. Neck supple.  Cardiovascular: Normal rate, regular rhythm and intact distal pulses.  Respiratory: Effort normal. No respiratory distress.  GI: Soft. He exhibits no distension.  Genitourinary:    Genitourinary Comments: deferred   Musculoskeletal:     Left hip: He exhibits decreased range of motion, decreased strength and bony tenderness.  Neurological: He is alert and oriented to person, place, and time. He has normal reflexes.  Skin: Skin is warm and dry.  Psychiatric: He has a normal mood and affect. His behavior is normal. Judgment and thought content normal.    Vital signs in last 24 hours: @VSRANGES @  Labs:   Estimated body mass index is 34.81 kg/m as calculated from the following:   Height as of 09/21/18: 6\' 2"  (1.88 m).   Weight as of 09/21/18: 123 kg.   Imaging Review Plain radiographs demonstrate severe degenerative joint disease of the left hip(s). The bone quality appears to be adequate for age and reported activity level.      Assessment/Plan:  End stage arthritis, left hip(s)  The patient history, physical examination, clinical judgement of the provider and imaging studies are consistent with end stage degenerative joint disease of the left hip(s) and total hip arthroplasty is deemed medically necessary. The treatment options including medical management, injection therapy, arthroscopy and arthroplasty were discussed at length. The risks and benefits of total hip arthroplasty were presented and reviewed. The risks due to aseptic loosening, infection, stiffness,  dislocation/subluxation,  thromboembolic complications and other imponderables were discussed.  The patient acknowledged the explanation, agreed to proceed with the plan and consent was signed. Patient is being admitted for inpatient treatment for surgery, pain control, PT, OT, prophylactic antibiotics, VTE prophylaxis, progressive ambulation and ADL's and discharge planning.The patient is planning to be discharged home with HEP (+) RA on MTX and Actemra - currently held   Patient's anticipated LOS is less than 2 midnights, meeting these requirements: - Younger than 98 - Lives within 1 hour of care - Has a competent adult at home to recover with post-op recover - NO history of  - Chronic pain requiring opiods  - Diabetes  - Coronary Artery Disease  - Heart failure  - Heart attack  - Stroke  - DVT/VTE  - Cardiac arrhythmia  - Respiratory Failure/COPD  - Renal failure  - Anemia  - Advanced Liver disease

## 2018-09-15 NOTE — H&P (View-Only) (Signed)
TOTAL HIP ADMISSION H&P  Patient is admitted for left total hip arthroplasty.  Subjective:  Chief Complaint: left hip pain  HPI: Brett Rosales, 73 y.o. male, has a history of pain and functional disability in the left hip(s) due to arthritis and patient has failed non-surgical conservative treatments for greater than 12 weeks to include NSAID's and/or analgesics, corticosteriod injections, flexibility and strengthening excercises, use of assistive devices, weight reduction as appropriate and activity modification.  Onset of symptoms was gradual starting 4 years ago with gradually worsening course since that time.The patient noted no past surgery on the left hip(s).  Patient currently rates pain in the left hip at 10 out of 10 with activity. Patient has night pain, worsening of pain with activity and weight bearing, pain that interfers with activities of daily living, pain with passive range of motion and crepitus. Patient has evidence of subchondral cysts, subchondral sclerosis, periarticular osteophytes and joint space narrowing by imaging studies. This condition presents safety issues increasing the risk of falls.   There is no current active infection.  Patient Active Problem List   Diagnosis Date Noted  . Swelling of left lower extremity 05/17/2018  . Ascending aortic aneurysm ([place]) 12/23/2017  . Typical atrial flutter ([place]) 12/16/2017  . Supraventricular tachycardia ([place]) 12/09/2017  . Essential hypertension 12/09/2017  . Dyspnea on exertion 12/09/2017  . Rheumatoid arthritis ([place]) 12/09/2017   Past Medical History:  Diagnosis Date  . Anxiety    medication made him feel flat stopped meds 09/07/2018  . Arthritis   . Depression   . Dysrhythmia 11/2017  . History of kidney stones    13 stones  . Hypertension   . Sleep apnea 03/2018   Had study after episode of disrhythmia. it was inconclusive. needs follow up    Past Surgical History:  Procedure Laterality Date  . TONSILLECTOMY AND  ADENOIDECTOMY    . VASECTOMY  1990    No current facility-administered medications for this visit.    No current outpatient medications on file.   Facility-Administered Medications Ordered in Other Visits  Medication Dose Route Frequency Provider Last Rate Last Dose  . 0.9 %  sodium chloride infusion   Intravenous Continuous Emmanuelle Coxe, Aaron Edelman, MD      . acetaminophen (OFIRMEV) IV 1,000 mg  1,000 mg Intravenous To OR Teriann Livingood, Aaron Edelman, MD      . ceFAZolin (ANCEF) IVPB 2g/100 mL premix  2 g Intravenous On Call to [place], MD      . chlorhexidine (HIBICLENS) 4 % liquid 4 application  60 mL Topical Once [facility]elite Violet, Aaron Edelman, MD      . chlorhexidine (HIBICLENS) 4 % liquid 4 application  60 mL Topical Once Rod Can, MD      . lactated ringers infusion   Intravenous Continuous Albertha Ghee, MD 50 mL/hr at 09/21/18 1151    . phenylephrine (NEOSYNEPHRINE) 10-0.9 MG/250ML-% infusion           . povidone-iodine 10 % swab 2 application  2 application Topical Once Ivana Nicastro, Aaron Edelman, MD      . tranexamic acid (CYKLOKAPRON) IVPB 1,000 mg  1,000 mg Intravenous To OR Rod Can, MD       Allergies  Allergen Reactions  . Nsaids Other (See Comments)    "Eats hole in stomach".Marland KitchenMarland KitchenMarland KitchenIbuprofen, Meloxicam...  . Statins Other (See Comments)    Myalgias    Social History   Tobacco Use  . Smoking status: Never Smoker  . Smokeless tobacco: Never Used  Substance Use  Topics  . Alcohol use: No    Alcohol/week: 0.0 standard drinks    Family History  Problem Relation Age of Onset  . Diabetes Mother   . Cancer Mother   . Hypertension Father      Review of Systems  Constitutional: Negative.   HENT: Negative.   Eyes: Positive for blurred vision.  Respiratory: Negative.   Cardiovascular: Negative.   Gastrointestinal: Negative.   Genitourinary: Negative.   Musculoskeletal: Positive for joint pain and myalgias.  Skin: Positive for itching.  Neurological: Negative.   Endo/Heme/Allergies:  Negative.   Psychiatric/Behavioral: Positive for depression and memory loss.    Objective:  Physical Exam  Vitals reviewed. Constitutional: He is oriented to person, place, and time. He appears well-developed and well-nourished.  HENT:  Head: Normocephalic and atraumatic.  Eyes: Pupils are equal, round, and reactive to light. Conjunctivae and EOM are normal.  Neck: Normal range of motion. Neck supple.  Cardiovascular: Normal rate, regular rhythm and intact distal pulses.  Respiratory: Effort normal. No respiratory distress.  GI: Soft. He exhibits no distension.  Genitourinary:    Genitourinary Comments: deferred   Musculoskeletal:     Left hip: He exhibits decreased range of motion, decreased strength and bony tenderness.  Neurological: He is alert and oriented to person, place, and time. He has normal reflexes.  Skin: Skin is warm and dry.  Psychiatric: He has a normal mood and affect. His behavior is normal. Judgment and thought content normal.    Vital signs in last 24 hours: @VSRANGES @  Labs:   Estimated body mass index is 34.81 kg/m as calculated from the following:   Height as of 09/21/18: 6\' 2"  (1.88 m).   Weight as of 09/21/18: 123 kg.   Imaging Review Plain radiographs demonstrate severe degenerative joint disease of the left hip(s). The bone quality appears to be adequate for age and reported activity level.      Assessment/Plan:  End stage arthritis, left hip(s)  The patient history, physical examination, clinical judgement of the provider and imaging studies are consistent with end stage degenerative joint disease of the left hip(s) and total hip arthroplasty is deemed medically necessary. The treatment options including medical management, injection therapy, arthroscopy and arthroplasty were discussed at length. The risks and benefits of total hip arthroplasty were presented and reviewed. The risks due to aseptic loosening, infection, stiffness,  dislocation/subluxation,  thromboembolic complications and other imponderables were discussed.  The patient acknowledged the explanation, agreed to proceed with the plan and consent was signed. Patient is being admitted for inpatient treatment for surgery, pain control, PT, OT, prophylactic antibiotics, VTE prophylaxis, progressive ambulation and ADL's and discharge planning.The patient is planning to be discharged home with HEP (+) RA on MTX and Actemra - currently held   Patient's anticipated LOS is less than 2 midnights, meeting these requirements: - Younger than 72 - Lives within 1 hour of care - Has a competent adult at home to recover with post-op recover - NO history of  - Chronic pain requiring opiods  - Diabetes  - Coronary Artery Disease  - Heart failure  - Heart attack  - Stroke  - DVT/VTE  - Cardiac arrhythmia  - Respiratory Failure/COPD  - Renal failure  - Anemia  - Advanced Liver disease

## 2018-09-18 ENCOUNTER — Other Ambulatory Visit (HOSPITAL_COMMUNITY)
Admission: RE | Admit: 2018-09-18 | Discharge: 2018-09-18 | Disposition: A | Discharge status: Home / Self Care | Payer: Medicare Other | Source: Ambulatory Visit | Attending: Orthopedic Surgery | Admitting: Orthopedic Surgery

## 2018-09-18 DIAGNOSIS — Z1159 Encounter for screening for other viral diseases: Secondary | ICD-10-CM | POA: Diagnosis not present

## 2018-09-18 LAB — SARS CORONAVIRUS 2 (TAT 6-24 HRS): SARS Coronavirus 2: NEGATIVE

## 2018-09-19 NOTE — Patient Instructions (Addendum)
YOU HA A COVID 19 TEST ON 09-18-2018. ONCE YOUR COVID TEST IS COMPLETED, PLEASE BEGIN THE QUARANTINE INSTRUCTIONS AS OUTLINED IN YOUR HANDOUT.                Brett Rosales     Your procedure is scheduled on: 09-21-2018   Report to [facility]  Entrance  Report to admitting at 2:10 PM    BRING CPAP [place]    Call this number if you have problems the morning of surgery [phone]     Remember: [place], NO CHEWING GUM CANDY OR MINTS.    NO SOLID FOOD AFTER MIDNIGHT THE NIGHT PRIOR TO SURGERY.  NOTHING BY MOUTH EXCEPT CLEAR LIQUIDS UNTIL 1:40 PM.  PLEASE FINISH ENSURE DRINK PER SURGEON ORDER 3 HOURS PRIOR TO SCHEDULED SURGERY TIME WHICH NEEDS TO BE COMPLETED AT 1:40 PM.  CLEAR LIQUID DIET   Foods Allowed                                                                     Foods Excluded  Coffee and tea, regular and decaf                             liquids that you cannot  Plain Jell-O in any flavor                                             see through such as: Fruit ices (not with fruit pulp)                                     milk, soups, orange juice  Iced Popsicles                                    All solid food Carbonated beverages, regular and diet                                    Cranberry, grape and apple juices Sports drinks like Gatorade Lightly seasoned clear broth or consume(fat free) Sugar, honey syrup  Sample Menu Breakfast                                Lunch                                     Supper Cranberry juice                    Beef broth                            Chicken  broth Jell-O                                     Grape juice                           Apple juice Coffee or tea                        Jell-O                                      Popsicle                                                Coffee or tea                        Coffee or  tea  _____________________________________________________________________     Take these medicines the morning of surgery with A SIP OF WATER:   Metoprolol tartrate                                You may not have any metal on your body including piercings            Do not wear jewelry,  lotions, powders or  deodorant                         Men may shave face and neck.   Do not bring valuables to the hospital. [place].  Contacts, dentures or bridgework may not be worn into surgery.        _____________________________________________________________________             [facility] - Preparing for Surgery Before surgery, you can play an important role.   Because skin is not sterile, your skin needs to be as free of germs as possible.   You can reduce the number of germs on your skin by washing with CHG (chlorahexidine gluconate) soap before surgery.   CHG is an antiseptic cleaner which kills germs and bonds with the skin to continue killing germs even after washing. Please DO NOT use if you have an allergy to CHG or antibacterial soaps.   If your skin becomes reddened/irritated stop using the CHG and inform your nurse when you arrive at Short Stay. .  You may shave your face/neck. Please follow these instructions carefully:  1.  Shower with CHG Soap the night before surgery and the  morning of Surgery.  2.  If you choose to wash your hair, wash your hair first as usual with your  normal  shampoo.  3.  After you shampoo, rinse your hair and body thoroughly to remove the  shampoo.  4.  Use CHG as you would any other liquid soap.  You can apply chg directly  to the skin and wash                       Gently with a scrungie or clean washcloth.  5.  Apply the CHG Soap to your body ONLY FROM THE NECK DOWN.   Do not use on face/ open                           Wound or open sores. Avoid contact  with eyes, ears mouth and genitals (private parts).                       Wash face,  Genitals (private parts) with your normal soap.             6.  Wash thoroughly, paying special attention to the area where your surgery  will be performed.  7.  Thoroughly rinse your body with warm water from the neck down.  8.  DO NOT shower/wash with your normal soap after using and rinsing off  the CHG Soap.             9.  Pat yourself dry with a clean towel.            10.  Wear clean pajamas.            11.  Place clean sheets on your bed the night of your first shower and do not  sleep with pets.  Day of Surgery : Do not apply any lotions/deodorants the morning of surgery.  Please wear clean clothes to the hospital/surgery center.   FAILURE TO FOLLOW THESE INSTRUCTIONS MAY RESULT IN THE CANCELLATION OF YOUR SURGERY PATIENT SIGNATURE_________________________________  NURSE SIGNATURE__________________________________  ________________________________________________________________________   Brett Rosales  An incentive spirometer is a tool that can help keep your lungs clear and active. This tool measures how well you are filling your lungs with each breath. Taking long deep breaths may help reverse or decrease the chance of developing breathing (pulmonary) problems (especially infection) following:  A long period of time when you are unable to move or be active. BEFORE THE PROCEDURE   If the spirometer includes an indicator to show your best effort, your nurse or respiratory therapist will set it to a desired goal.  If possible, sit up straight or lean slightly forward. Try not to slouch.  Hold the incentive spirometer in an upright position. INSTRUCTIONS FOR USE  1. Sit on the edge of your bed if possible, or sit up as far as you can in bed or on a chair. 2. Hold the incentive spirometer in an upright position. 3. Breathe out normally. 4. Place the mouthpiece in your mouth and  seal your lips tightly around it. 5. Breathe in slowly and as deeply as possible, raising the piston or the ball toward the top of the column. 6. Hold your breath for 3-5 seconds or for as long as possible. Allow the piston or ball to fall to the bottom of the column. 7. Remove the mouthpiece from your mouth and breathe out normally. 8. Rest for a few seconds and repeat Steps 1 through 7 at least 10 times every 1-2 hours when you are awake. Take your time and take a few normal breaths between deep breaths. 9. The spirometer may include an indicator to show  your best effort. Use the indicator as a goal to work toward during each repetition. 10. After each set of 10 deep breaths, practice coughing to be sure your lungs are clear. If you have an incision (the cut made at the time of surgery), support your incision when coughing by placing a pillow or rolled up towels firmly against it. Once you are able to get out of bed, walk around indoors and cough well. You may stop using the incentive spirometer when instructed by your caregiver.  RISKS AND COMPLICATIONS  Take your time so you do not get dizzy or light-headed.  If you are in pain, you may need to take or ask for pain medication before doing incentive spirometry. It is harder to take a deep breath if you are having pain. AFTER USE  Rest and breathe slowly and easily.  It can be helpful to keep track of a log of your progress. Your caregiver can provide you with a simple table to help with this. If you are using the spirometer at home, follow these instructions: [place] IF:   You are having difficultly using the spirometer.  You have trouble using the spirometer as often as instructed.  Your pain medication is not giving enough relief while using the spirometer.  You develop fever of 100.5 F (38.1 C) or higher. SEEK IMMEDIATE MEDICAL CARE IF:   You cough up bloody sputum that had not been present before.  You develop fever  of 102 F (38.9 C) or greater.  You develop worsening pain at or near the incision site. MAKE SURE YOU:   Understand these instructions.  Will watch your condition.  Will get help right away if you are not doing well or get worse. Document Released: 07/05/2006 Document Revised: 05/17/2011 Document Reviewed: 09/05/2006 ExitCare Patient Information 2014 Exit[employer], [place].   ________________________________________________________________________  WHAT IS A BLOOD TRANSFUSION? Blood Transfusion Information  A transfusion is the replacement of blood or some of its parts. Blood is made up of multiple cells which provide different functions.  Red blood cells carry oxygen and are used for blood loss replacement.  White blood cells fight against infection.  Platelets control bleeding.  Plasma helps clot blood.  Other blood products are available for specialized needs, such as hemophilia or other clotting disorders. BEFORE THE TRANSFUSION  Who gives blood for transfusions?   Healthy volunteers who are fully evaluated to make sure their blood is safe. This is blood bank blood. Transfusion therapy is the safest it has ever been in the practice of medicine. Before blood is taken from a donor, a complete history is taken to make sure that person has no history of diseases nor engages in risky social behavior (examples are intravenous drug use or sexual activity with multiple partners). The donor's travel history is screened to minimize risk of transmitting infections, such as malaria. The donated blood is tested for signs of infectious diseases, such as HIV and hepatitis. The blood is then tested to be sure it is compatible with you in order to minimize the chance of a transfusion reaction. If you or a relative donates blood, this is often done in anticipation of surgery and is not appropriate for emergency situations. It takes many days to process the donated blood. RISKS AND  COMPLICATIONS Although transfusion therapy is very safe and saves many lives, the main dangers of transfusion include:   Getting an infectious disease.  Developing a transfusion reaction. This is an allergic reaction to  something in the blood you were given. Every precaution is taken to prevent this. The decision to have a blood transfusion has been considered carefully by your caregiver before blood is given. Blood is not given unless the benefits outweigh the risks. AFTER THE TRANSFUSION  Right after receiving a blood transfusion, you will usually feel much better and more energetic. This is especially true if your red blood cells have gotten low (anemic). The transfusion raises the level of the red blood cells which carry oxygen, and this usually causes an energy increase.  The nurse administering the transfusion will monitor you carefully for complications. HOME CARE INSTRUCTIONS  No special instructions are needed after a transfusion. You may find your energy is better. Speak with your caregiver about any limitations on activity for underlying diseases you may have. SEEK MEDICAL CARE IF:   Your condition is not improving after your transfusion.  You develop redness or irritation at the intravenous (IV) site. SEEK IMMEDIATE MEDICAL CARE IF:  Any of the following symptoms occur over the next 12 hours:  Shaking chills.  You have a temperature by mouth above 102 F (38.9 C), not controlled by medicine.  Chest, back, or muscle pain.  People around you feel you are not acting correctly or are confused.  Shortness of breath or difficulty breathing.  Dizziness and fainting.  You get a rash or develop hives.  You have a decrease in urine output.  Your urine turns a dark color or changes to pink, red, or brown. Any of the following symptoms occur over the next 10 days:  You have a temperature by mouth above 102 F (38.9 C), not controlled by medicine.  Shortness of  breath.  Weakness after normal activity.  The white part of the eye turns yellow (jaundice).  You have a decrease in the amount of urine or are urinating less often.  Your urine turns a dark color or changes to pink, red, or brown. Document Released: 02/20/2000 Document Revised: 05/17/2011 Document Reviewed: 10/09/2007 [facility] Patient Information 2014 [place], [place].  _______________________________________________________________________

## 2018-09-19 NOTE — Progress Notes (Signed)
CARDIAC CLEARANCE NOTE DR Agustin Cree 07-21-18 Epic NOTE TO STOP XARELTO X 2 DAYS PRIOR TO SURGERY DR Agustin Cree 07-21-18 Epic EKG 05-17-18 Epic ECHO 12-19-17 Epic CHEST CT 12-06-17 Epic CHEST 1 VIEW XRAY 12-06-17 EPIC

## 2018-09-20 ENCOUNTER — Encounter (HOSPITAL_COMMUNITY)
Admission: RE | Admit: 2018-09-20 | Discharge: 2018-09-20 | Disposition: A | Discharge status: Home / Self Care | Payer: Medicare Other | Source: Ambulatory Visit | Attending: Orthopedic Surgery | Admitting: Orthopedic Surgery

## 2018-09-20 ENCOUNTER — Other Ambulatory Visit: Payer: Self-pay

## 2018-09-20 ENCOUNTER — Encounter (HOSPITAL_COMMUNITY): Payer: Self-pay

## 2018-09-20 DIAGNOSIS — M1612 Unilateral primary osteoarthritis, left hip: Secondary | ICD-10-CM | POA: Insufficient documentation

## 2018-09-20 DIAGNOSIS — F419 Anxiety disorder, unspecified: Secondary | ICD-10-CM | POA: Diagnosis not present

## 2018-09-20 DIAGNOSIS — Z7901 Long term (current) use of anticoagulants: Secondary | ICD-10-CM | POA: Diagnosis not present

## 2018-09-20 DIAGNOSIS — Z87442 Personal history of urinary calculi: Secondary | ICD-10-CM | POA: Diagnosis not present

## 2018-09-20 DIAGNOSIS — Z886 Allergy status to analgesic agent status: Secondary | ICD-10-CM | POA: Diagnosis not present

## 2018-09-20 DIAGNOSIS — G4730 Sleep apnea, unspecified: Secondary | ICD-10-CM | POA: Diagnosis not present

## 2018-09-20 DIAGNOSIS — Z888 Allergy status to other drugs, medicaments and biological substances status: Secondary | ICD-10-CM | POA: Diagnosis not present

## 2018-09-20 DIAGNOSIS — M069 Rheumatoid arthritis, unspecified: Secondary | ICD-10-CM | POA: Diagnosis not present

## 2018-09-20 DIAGNOSIS — F329 Major depressive disorder, single episode, unspecified: Secondary | ICD-10-CM | POA: Diagnosis not present

## 2018-09-20 DIAGNOSIS — Z79899 Other long term (current) drug therapy: Secondary | ICD-10-CM | POA: Diagnosis not present

## 2018-09-20 DIAGNOSIS — I483 Typical atrial flutter: Secondary | ICD-10-CM | POA: Diagnosis not present

## 2018-09-20 DIAGNOSIS — I110 Hypertensive heart disease with heart failure: Secondary | ICD-10-CM | POA: Diagnosis not present

## 2018-09-20 DIAGNOSIS — Z01812 Encounter for preprocedural laboratory examination: Secondary | ICD-10-CM | POA: Insufficient documentation

## 2018-09-20 HISTORY — DX: Depression, unspecified: F32.A

## 2018-09-20 HISTORY — DX: Personal history of urinary calculi: Z87.442

## 2018-09-20 LAB — SURGICAL PCR SCREEN
MRSA, PCR: NEGATIVE
Staphylococcus aureus: NEGATIVE

## 2018-09-20 LAB — COMPREHENSIVE METABOLIC PANEL
ALT: 28 U/L (ref 0–44)
AST: 19 U/L (ref 15–41)
Albumin: 4.2 g/dL (ref 3.5–5.0)
Alkaline Phosphatase: 50 U/L (ref 38–126)
Anion gap: 9 (ref 5–15)
BUN: 28 mg/dL — ABNORMAL HIGH (ref 8–23)
CO2: 21 mmol/L — ABNORMAL LOW (ref 22–32)
Calcium: 8.9 mg/dL (ref 8.9–10.3)
Chloride: 110 mmol/L (ref 98–111)
Creatinine, Ser: 1.12 mg/dL (ref 0.61–1.24)
GFR calc Af Amer: >60 mL/min (ref >60)
GFR calc non Af Amer: >60 mL/min (ref >60)
Glucose, Bld: 102 mg/dL — ABNORMAL HIGH (ref 70–99)
Potassium: 4.4 mmol/L (ref 3.5–5.1)
Sodium: 140 mmol/L (ref 135–145)
Total Bilirubin: 0.5 mg/dL (ref 0.3–1.2)
Total Protein: 6.6 g/dL (ref 6.5–8.1)

## 2018-09-20 LAB — URINALYSIS, ROUTINE W REFLEX MICROSCOPIC
Bilirubin Urine: NEGATIVE
Glucose, UA: NEGATIVE mg/dL
Hgb urine dipstick: NEGATIVE
Ketones, ur: NEGATIVE mg/dL
Leukocytes,Ua: NEGATIVE
Nitrite: NEGATIVE
Protein, ur: NEGATIVE mg/dL
Specific Gravity, Urine: 1.019 (ref 1.005–1.030)
pH: 5 (ref 5.0–8.0)

## 2018-09-20 LAB — PROTIME-INR
INR: 0.9 (ref 0.8–1.2)
Prothrombin Time: 12 seconds (ref 11.4–15.2)

## 2018-09-20 LAB — CBC
HCT: 42.9 % (ref 39.0–52.0)
Hemoglobin: 13.7 g/dL (ref 13.0–17.0)
MCH: 32.5 pg (ref 26.0–34.0)
MCHC: 31.9 g/dL (ref 30.0–36.0)
MCV: 101.7 fL — ABNORMAL HIGH (ref 80.0–100.0)
Platelets: 227 10*3/uL (ref 150–400)
RBC: 4.22 MIL/uL (ref 4.22–5.81)
RDW: 13.8 % (ref 11.5–15.5)
WBC: 4 10*3/uL (ref 4.0–10.5)
nRBC: 0 % (ref 0.0–0.2)

## 2018-09-20 LAB — ABO/RH: ABO/RH(D): A POS

## 2018-09-20 NOTE — Progress Notes (Signed)
Patient informed of time change for surgery. He is to arrive 1040 for 1310 surgery on 09/21/18. Ensure drink at 1010 on 09/21/18. He verbalizes understanding.

## 2018-09-20 NOTE — Progress Notes (Addendum)
Bryson Dames PA  Patient stopped Jennye Moccasin 09/1118 per dr. Agustin Cree his cardiologist. Labs are in [place] Dr. Lyla Glassing has clearances from pt' Rheumatologist  And crdiologist

## 2018-09-21 ENCOUNTER — Encounter (HOSPITAL_COMMUNITY): Payer: Self-pay | Admitting: *Deleted

## 2018-09-21 ENCOUNTER — Inpatient Hospital Stay (HOSPITAL_COMMUNITY): Payer: Medicare Other | Admitting: Certified Registered Nurse Anesthetist

## 2018-09-21 ENCOUNTER — Inpatient Hospital Stay (HOSPITAL_COMMUNITY): Payer: Medicare Other

## 2018-09-21 ENCOUNTER — Observation Stay (HOSPITAL_COMMUNITY)
Admission: RE | Admit: 2018-09-21 | Discharge: 2018-09-22 | Disposition: A | Discharge status: Home / Self Care | Payer: Medicare Other | Source: Ambulatory Visit | Attending: Orthopedic Surgery | Admitting: Orthopedic Surgery

## 2018-09-21 ENCOUNTER — Encounter (HOSPITAL_COMMUNITY)
Admission: RE | Disposition: A | Discharge status: Home / Self Care | Payer: Self-pay | Source: Ambulatory Visit | Attending: Orthopedic Surgery

## 2018-09-21 DIAGNOSIS — I10 Essential (primary) hypertension: Secondary | ICD-10-CM | POA: Diagnosis not present

## 2018-09-21 DIAGNOSIS — Z87442 Personal history of urinary calculi: Secondary | ICD-10-CM | POA: Insufficient documentation

## 2018-09-21 DIAGNOSIS — F418 Other specified anxiety disorders: Secondary | ICD-10-CM | POA: Diagnosis not present

## 2018-09-21 DIAGNOSIS — G4730 Sleep apnea, unspecified: Secondary | ICD-10-CM | POA: Insufficient documentation

## 2018-09-21 DIAGNOSIS — M069 Rheumatoid arthritis, unspecified: Secondary | ICD-10-CM | POA: Insufficient documentation

## 2018-09-21 DIAGNOSIS — Z01812 Encounter for preprocedural laboratory examination: Secondary | ICD-10-CM | POA: Diagnosis not present

## 2018-09-21 DIAGNOSIS — F419 Anxiety disorder, unspecified: Secondary | ICD-10-CM | POA: Diagnosis not present

## 2018-09-21 DIAGNOSIS — Z7901 Long term (current) use of anticoagulants: Secondary | ICD-10-CM | POA: Insufficient documentation

## 2018-09-21 DIAGNOSIS — M1612 Unilateral primary osteoarthritis, left hip: Principal | ICD-10-CM | POA: Insufficient documentation

## 2018-09-21 DIAGNOSIS — Z96642 Presence of left artificial hip joint: Secondary | ICD-10-CM | POA: Diagnosis not present

## 2018-09-21 DIAGNOSIS — I483 Typical atrial flutter: Secondary | ICD-10-CM | POA: Insufficient documentation

## 2018-09-21 DIAGNOSIS — Z471 Aftercare following joint replacement surgery: Secondary | ICD-10-CM | POA: Diagnosis not present

## 2018-09-21 DIAGNOSIS — F329 Major depressive disorder, single episode, unspecified: Secondary | ICD-10-CM | POA: Diagnosis not present

## 2018-09-21 DIAGNOSIS — Z888 Allergy status to other drugs, medicaments and biological substances status: Secondary | ICD-10-CM | POA: Insufficient documentation

## 2018-09-21 DIAGNOSIS — I110 Hypertensive heart disease with heart failure: Secondary | ICD-10-CM | POA: Diagnosis not present

## 2018-09-21 DIAGNOSIS — Z09 Encounter for follow-up examination after completed treatment for conditions other than malignant neoplasm: Secondary | ICD-10-CM

## 2018-09-21 DIAGNOSIS — Z419 Encounter for procedure for purposes other than remedying health state, unspecified: Secondary | ICD-10-CM

## 2018-09-21 DIAGNOSIS — Z886 Allergy status to analgesic agent status: Secondary | ICD-10-CM | POA: Diagnosis not present

## 2018-09-21 DIAGNOSIS — Z79899 Other long term (current) drug therapy: Secondary | ICD-10-CM | POA: Insufficient documentation

## 2018-09-21 HISTORY — DX: Unilateral primary osteoarthritis, left hip: M16.12

## 2018-09-21 HISTORY — PX: TOTAL HIP ARTHROPLASTY: SHX124

## 2018-09-21 LAB — TYPE AND SCREEN
ABO/RH(D): A POS
Antibody Screen: NEGATIVE

## 2018-09-21 SURGERY — ARTHROPLASTY, HIP, TOTAL, ANTERIOR APPROACH
Anesthesia: Spinal | Site: Hip | Laterality: Left

## 2018-09-21 MED ORDER — METHOCARBAMOL 500 MG PO TABS
500.0000 mg | ORAL_TABLET | Freq: Four times a day (QID) | ORAL | Status: DC | PRN
  Administered 2018-09-21 – 2018-09-22 (×2): 500 mg via ORAL
  Filled 2018-09-21 (×2): qty 1

## 2018-09-21 MED ORDER — DIPHENHYDRAMINE HCL 12.5 MG/5ML PO ELIX: 12.5000 mg | ORAL_SOLUTION | ORAL | Status: DC | PRN

## 2018-09-21 MED ORDER — MORPHINE SULFATE (PF) 4 MG/ML IV SOLN: 0.5000 mg | INTRAVENOUS | Status: DC | PRN

## 2018-09-21 MED ORDER — SODIUM CHLORIDE (PF) 0.9 % IJ SOLN
INTRAMUSCULAR | Status: DC | PRN
  Administered 2018-09-21: 30 mL

## 2018-09-21 MED ORDER — BUPIVACAINE-EPINEPHRINE (PF) 0.25% -1:200000 IJ SOLN
INTRAMUSCULAR | Status: AC
  Filled 2018-09-21: qty 30

## 2018-09-21 MED ORDER — OXYCODONE HCL 5 MG PO TABS: 5.0000 mg | ORAL_TABLET | Freq: Once | ORAL | Status: DC | PRN

## 2018-09-21 MED ORDER — DEXAMETHASONE SODIUM PHOSPHATE 10 MG/ML IJ SOLN
INTRAMUSCULAR | Status: DC | PRN
  Administered 2018-09-21: 10 mg via INTRAVENOUS

## 2018-09-21 MED ORDER — KETOROLAC TROMETHAMINE 30 MG/ML IJ SOLN
INTRAMUSCULAR | Status: DC | PRN
  Administered 2018-09-21: 30 mg

## 2018-09-21 MED ORDER — METHOCARBAMOL 500 MG IVPB - SIMPLE MED
500.0000 mg | Freq: Four times a day (QID) | INTRAVENOUS | Status: DC | PRN
  Filled 2018-09-21: qty 50

## 2018-09-21 MED ORDER — KETOROLAC TROMETHAMINE 15 MG/ML IJ SOLN
7.5000 mg | Freq: Four times a day (QID) | INTRAMUSCULAR | Status: AC
  Administered 2018-09-21 – 2018-09-22 (×4): 7.5 mg via INTRAVENOUS
  Filled 2018-09-21 (×4): qty 1

## 2018-09-21 MED ORDER — RIVAROXABAN 10 MG PO TABS
10.0000 mg | ORAL_TABLET | Freq: Every day | ORAL | Status: DC
  Administered 2018-09-22: 10 mg via ORAL
  Filled 2018-09-21: qty 1

## 2018-09-21 MED ORDER — DEXAMETHASONE SODIUM PHOSPHATE 10 MG/ML IJ SOLN
INTRAMUSCULAR | Status: AC
  Filled 2018-09-21: qty 1

## 2018-09-21 MED ORDER — ONDANSETRON HCL 4 MG/2ML IJ SOLN
INTRAMUSCULAR | Status: AC
  Filled 2018-09-21: qty 2

## 2018-09-21 MED ORDER — MIDAZOLAM HCL 2 MG/2ML IJ SOLN
INTRAMUSCULAR | Status: AC
  Filled 2018-09-21: qty 2

## 2018-09-21 MED ORDER — BUPIVACAINE IN DEXTROSE 0.75-8.25 % IT SOLN
INTRATHECAL | Status: DC | PRN
  Administered 2018-09-21: 2 mL via INTRATHECAL

## 2018-09-21 MED ORDER — DOCUSATE SODIUM 100 MG PO CAPS
100.0000 mg | ORAL_CAPSULE | Freq: Two times a day (BID) | ORAL | Status: DC
  Administered 2018-09-21: 100 mg via ORAL
  Filled 2018-09-21: qty 1

## 2018-09-21 MED ORDER — PROPOFOL 10 MG/ML IV BOLUS
INTRAVENOUS | Status: DC | PRN
  Administered 2018-09-21: 20 mg via INTRAVENOUS

## 2018-09-21 MED ORDER — SODIUM CHLORIDE 0.9 % IR SOLN
Status: DC | PRN
  Administered 2018-09-21: 1000 mL

## 2018-09-21 MED ORDER — TRANEXAMIC ACID-NACL 1000-0.7 MG/100ML-% IV SOLN
1000.0000 mg | INTRAVENOUS | Status: AC
  Administered 2018-09-21: 1000 mg via INTRAVENOUS
  Filled 2018-09-21: qty 100

## 2018-09-21 MED ORDER — LACTATED RINGERS IV SOLN
INTRAVENOUS | Status: DC
  Administered 2018-09-21 (×3): via INTRAVENOUS

## 2018-09-21 MED ORDER — PROPOFOL 10 MG/ML IV BOLUS
INTRAVENOUS | Status: AC
  Filled 2018-09-21: qty 20

## 2018-09-21 MED ORDER — LOSARTAN POTASSIUM 50 MG PO TABS: 100.0000 mg | ORAL_TABLET | Freq: Every day | ORAL | Status: DC

## 2018-09-21 MED ORDER — ACETAMINOPHEN 10 MG/ML IV SOLN
1000.0000 mg | INTRAVENOUS | Status: AC
  Administered 2018-09-21: 1000 mg via INTRAVENOUS
  Filled 2018-09-21: qty 100

## 2018-09-21 MED ORDER — ONDANSETRON HCL 4 MG PO TABS: 4.0000 mg | ORAL_TABLET | Freq: Four times a day (QID) | ORAL | Status: DC | PRN

## 2018-09-21 MED ORDER — METHYLPHENIDATE HCL 10 MG PO TABS
20.0000 mg | ORAL_TABLET | Freq: Two times a day (BID) | ORAL | Status: DC
  Administered 2018-09-22 (×2): 20 mg via ORAL
  Filled 2018-09-21 (×2): qty 2

## 2018-09-21 MED ORDER — POVIDONE-IODINE 10 % EX SWAB: 2.0000 "application " | Freq: Once | CUTANEOUS | Status: DC

## 2018-09-21 MED ORDER — CHLORHEXIDINE GLUCONATE 4 % EX LIQD: 60.0000 mL | Freq: Once | CUTANEOUS | Status: DC

## 2018-09-21 MED ORDER — SODIUM CHLORIDE 0.9 % IV SOLN
INTRAVENOUS | Status: DC
  Administered 2018-09-21 – 2018-09-22 (×2): via INTRAVENOUS

## 2018-09-21 MED ORDER — FENTANYL CITRATE (PF) 100 MCG/2ML IJ SOLN: 25.0000 ug | INTRAMUSCULAR | Status: DC | PRN

## 2018-09-21 MED ORDER — PHENYLEPHRINE 40 MCG/ML (10ML) SYRINGE FOR IV PUSH (FOR BLOOD PRESSURE SUPPORT)
PREFILLED_SYRINGE | INTRAVENOUS | Status: DC | PRN
  Administered 2018-09-21 (×2): 80 ug via INTRAVENOUS

## 2018-09-21 MED ORDER — HYDROCODONE-ACETAMINOPHEN 7.5-325 MG PO TABS: 1.0000 | ORAL_TABLET | ORAL | Status: DC | PRN

## 2018-09-21 MED ORDER — WATER FOR IRRIGATION, STERILE IR SOLN
Status: DC | PRN
  Administered 2018-09-21 (×2): 1000 mL

## 2018-09-21 MED ORDER — MENTHOL 3 MG MT LOZG: 1.0000 | LOZENGE | OROMUCOSAL | Status: DC | PRN

## 2018-09-21 MED ORDER — ONDANSETRON HCL 4 MG/2ML IJ SOLN: 4.0000 mg | Freq: Four times a day (QID) | INTRAMUSCULAR | Status: DC | PRN

## 2018-09-21 MED ORDER — EPHEDRINE 5 MG/ML INJ
INTRAVENOUS | Status: AC
  Filled 2018-09-21: qty 10

## 2018-09-21 MED ORDER — ALUM & MAG HYDROXIDE-SIMETH 200-200-20 MG/5ML PO SUSP: 30.0000 mL | ORAL | Status: DC | PRN

## 2018-09-21 MED ORDER — PHENOL 1.4 % MT LIQD: 1.0000 | OROMUCOSAL | Status: DC | PRN

## 2018-09-21 MED ORDER — FOLIC ACID 1 MG PO TABS: 1.0000 mg | ORAL_TABLET | Freq: Every day | ORAL | Status: DC

## 2018-09-21 MED ORDER — HYDROCODONE-ACETAMINOPHEN 5-325 MG PO TABS
1.0000 | ORAL_TABLET | ORAL | Status: DC | PRN
  Administered 2018-09-21 – 2018-09-22 (×4): 2 via ORAL
  Filled 2018-09-21 (×4): qty 2

## 2018-09-21 MED ORDER — BUPIVACAINE-EPINEPHRINE 0.25% -1:200000 IJ SOLN
INTRAMUSCULAR | Status: DC | PRN
  Administered 2018-09-21: 30 mL

## 2018-09-21 MED ORDER — METOCLOPRAMIDE HCL 5 MG/ML IJ SOLN: 5.0000 mg | Freq: Three times a day (TID) | INTRAMUSCULAR | Status: DC | PRN

## 2018-09-21 MED ORDER — SODIUM CHLORIDE 0.9 % IV SOLN: INTRAVENOUS | Status: DC

## 2018-09-21 MED ORDER — EZETIMIBE 10 MG PO TABS: 10.0000 mg | ORAL_TABLET | Freq: Every day | ORAL | Status: DC

## 2018-09-21 MED ORDER — CEFAZOLIN SODIUM-DEXTROSE 2-4 GM/100ML-% IV SOLN
2.0000 g | INTRAVENOUS | Status: AC
  Administered 2018-09-21: 2 g via INTRAVENOUS
  Filled 2018-09-21: qty 100

## 2018-09-21 MED ORDER — SENNA 8.6 MG PO TABS
1.0000 | ORAL_TABLET | Freq: Two times a day (BID) | ORAL | Status: DC
  Administered 2018-09-21: 8.6 mg via ORAL
  Filled 2018-09-21: qty 1

## 2018-09-21 MED ORDER — METOCLOPRAMIDE HCL 5 MG PO TABS: 5.0000 mg | ORAL_TABLET | Freq: Three times a day (TID) | ORAL | Status: DC | PRN

## 2018-09-21 MED ORDER — ACETAMINOPHEN 325 MG PO TABS: 325.0000 mg | ORAL_TABLET | Freq: Four times a day (QID) | ORAL | Status: DC | PRN

## 2018-09-21 MED ORDER — POLYETHYLENE GLYCOL 3350 17 G PO PACK: 17.0000 g | PACK | Freq: Every day | ORAL | Status: DC | PRN

## 2018-09-21 MED ORDER — CEFAZOLIN SODIUM-DEXTROSE 2-4 GM/100ML-% IV SOLN
2.0000 g | Freq: Four times a day (QID) | INTRAVENOUS | Status: AC
  Administered 2018-09-21 (×2): 2 g via INTRAVENOUS
  Filled 2018-09-21 (×2): qty 100

## 2018-09-21 MED ORDER — POVIDONE-IODINE 10 % EX SWAB
2.0000 "application " | Freq: Once | CUTANEOUS | Status: AC
  Administered 2018-09-21: 2 via TOPICAL

## 2018-09-21 MED ORDER — PHENYLEPHRINE HCL-NACL 10-0.9 MG/250ML-% IV SOLN
INTRAVENOUS | Status: AC
  Filled 2018-09-21: qty 250

## 2018-09-21 MED ORDER — SODIUM CHLORIDE (PF) 0.9 % IJ SOLN
INTRAMUSCULAR | Status: AC
  Filled 2018-09-21: qty 50

## 2018-09-21 MED ORDER — PROPOFOL 10 MG/ML IV BOLUS
INTRAVENOUS | Status: AC
  Filled 2018-09-21: qty 60

## 2018-09-21 MED ORDER — ONDANSETRON HCL 4 MG/2ML IJ SOLN
INTRAMUSCULAR | Status: DC | PRN
  Administered 2018-09-21: 4 mg via INTRAVENOUS

## 2018-09-21 MED ORDER — KETOROLAC TROMETHAMINE 30 MG/ML IJ SOLN
INTRAMUSCULAR | Status: AC
  Filled 2018-09-21: qty 1

## 2018-09-21 MED ORDER — FENTANYL CITRATE (PF) 100 MCG/2ML IJ SOLN
INTRAMUSCULAR | Status: DC | PRN
  Administered 2018-09-21 (×4): 25 ug via INTRAVENOUS

## 2018-09-21 MED ORDER — FUROSEMIDE 20 MG PO TABS: 20.0000 mg | ORAL_TABLET | Freq: Every day | ORAL | Status: DC | PRN

## 2018-09-21 MED ORDER — DEXAMETHASONE SODIUM PHOSPHATE 10 MG/ML IJ SOLN: 10.0000 mg | Freq: Once | INTRAMUSCULAR | Status: DC

## 2018-09-21 MED ORDER — EPHEDRINE SULFATE-NACL 50-0.9 MG/10ML-% IV SOSY
PREFILLED_SYRINGE | INTRAVENOUS | Status: DC | PRN
  Administered 2018-09-21 (×4): 10 mg via INTRAVENOUS

## 2018-09-21 MED ORDER — MIDAZOLAM HCL 5 MG/5ML IJ SOLN
INTRAMUSCULAR | Status: DC | PRN
  Administered 2018-09-21: 2 mg via INTRAVENOUS

## 2018-09-21 MED ORDER — PROPOFOL 500 MG/50ML IV EMUL
INTRAVENOUS | Status: DC | PRN
  Administered 2018-09-21: 50 ug/kg/min via INTRAVENOUS

## 2018-09-21 MED ORDER — PHENYLEPHRINE 40 MCG/ML (10ML) SYRINGE FOR IV PUSH (FOR BLOOD PRESSURE SUPPORT)
PREFILLED_SYRINGE | INTRAVENOUS | Status: AC
  Filled 2018-09-21: qty 10

## 2018-09-21 MED ORDER — METOPROLOL TARTRATE 25 MG PO TABS
25.0000 mg | ORAL_TABLET | Freq: Two times a day (BID) | ORAL | Status: DC
  Administered 2018-09-21: 25 mg via ORAL
  Filled 2018-09-21: qty 1

## 2018-09-21 MED ORDER — FENTANYL CITRATE (PF) 100 MCG/2ML IJ SOLN
INTRAMUSCULAR | Status: AC
  Filled 2018-09-21: qty 2

## 2018-09-21 MED ORDER — OXYCODONE HCL 5 MG/5ML PO SOLN: 5.0000 mg | Freq: Once | ORAL | Status: DC | PRN

## 2018-09-21 SURGICAL SUPPLY — 57 items
ACETAB CUP W/GRIPTION 54 (Plate) ×2 IMPLANT
BAG DECANTER FOR FLEXI CONT (MISCELLANEOUS) IMPLANT
BAG ZIPLOCK 12X15 (MISCELLANEOUS) IMPLANT
BLADE SURG SZ10 CARB STEEL (BLADE) IMPLANT
CHLORAPREP W/TINT 26 (MISCELLANEOUS) ×2 IMPLANT
COVER PERINEAL POST (MISCELLANEOUS) ×2 IMPLANT
COVER SURGICAL LIGHT HANDLE (MISCELLANEOUS) ×2 IMPLANT
COVER WAND RF STERILE (DRAPES) IMPLANT
CUP ACETAB W/GRIPTION 54 (Plate) ×1 IMPLANT
DECANTER SPIKE VIAL GLASS SM (MISCELLANEOUS) ×2 IMPLANT
DERMABOND ADVANCED (GAUZE/BANDAGES/DRESSINGS) ×1
DERMABOND ADVANCED .7 DNX12 (GAUZE/BANDAGES/DRESSINGS) ×1 IMPLANT
DRAPE IMP U-DRAPE 54X76 (DRAPES) ×2 IMPLANT
DRAPE SHEET LG 3/4 BI-LAMINATE (DRAPES) ×6 IMPLANT
DRAPE U-SHAPE 47X51 STRL (DRAPES) ×4 IMPLANT
DRESSING AQUACEL AG SP 3.5X10 (GAUZE/BANDAGES/DRESSINGS) ×1 IMPLANT
DRSG AQUACEL AG ADV 3.5X10 (GAUZE/BANDAGES/DRESSINGS) ×2 IMPLANT
DRSG AQUACEL AG SP 3.5X10 (GAUZE/BANDAGES/DRESSINGS) ×2
ELECT PENCIL ROCKER SW 15FT (MISCELLANEOUS) ×2 IMPLANT
ELECT REM PT RETURN 15FT ADLT (MISCELLANEOUS) ×2 IMPLANT
GAUZE SPONGE 4X4 12PLY STRL (GAUZE/BANDAGES/DRESSINGS) ×2 IMPLANT
GLOVE BIO SURGEON STRL SZ8.5 (GLOVE) ×4 IMPLANT
GLOVE BIOGEL PI IND STRL 8.5 (GLOVE) ×1 IMPLANT
GLOVE BIOGEL PI INDICATOR 8.5 (GLOVE) ×1
GOWN SPEC L3 XXLG W/TWL (GOWN DISPOSABLE) ×2 IMPLANT
HANDPIECE INTERPULSE COAX TIP (DISPOSABLE) ×1
HEAD FEMORAL 32 CERAMIC (Hips) ×2 IMPLANT
HOLDER FOLEY CATH W/STRAP (MISCELLANEOUS) ×2 IMPLANT
HOOD PEEL AWAY FLYTE STAYCOOL (MISCELLANEOUS) ×8 IMPLANT
JET LAVAGE IRRISEPT WOUND (IRRIGATION / IRRIGATOR) ×2
KIT TURNOVER KIT A (KITS) IMPLANT
LAVAGE JET IRRISEPT WOUND (IRRIGATION / IRRIGATOR) ×1 IMPLANT
LINER PINN ACET NEUT 32X54 ×2 IMPLANT
MANIFOLD NEPTUNE II (INSTRUMENTS) ×2 IMPLANT
MARKER SKIN DUAL TIP RULER LAB (MISCELLANEOUS) ×2 IMPLANT
NDL SAFETY ECLIPSE 18X1.5 (NEEDLE) ×1 IMPLANT
NEEDLE HYPO 18GX1.5 SHARP (NEEDLE) ×1
NEEDLE SPNL 18GX3.5 QUINCKE PK (NEEDLE) ×2 IMPLANT
PACK ANTERIOR HIP CUSTOM (KITS) ×2 IMPLANT
SAW OSC TIP CART 19.5X105X1.3 (SAW) ×2 IMPLANT
SEALER BIPOLAR AQUA 6.0 (INSTRUMENTS) ×2 IMPLANT
SET HNDPC FAN SPRY TIP SCT (DISPOSABLE) ×1 IMPLANT
STEM TRI LOC BPS SZ8 W GRIPTON ×1 IMPLANT
SUT ETHIBOND NAB CT1 #1 30IN (SUTURE) ×4 IMPLANT
SUT MNCRL AB 3-0 PS2 18 (SUTURE) ×2 IMPLANT
SUT MNCRL AB 4-0 PS2 18 (SUTURE) ×2 IMPLANT
SUT MON AB 2-0 CT1 36 (SUTURE) ×4 IMPLANT
SUT STRATAFIX PDO 1 14 VIOLET (SUTURE) ×1
SUT STRATFX PDO 1 14 VIOLET (SUTURE) ×1
SUT VIC AB 2-0 CT1 27 (SUTURE) ×1
SUT VIC AB 2-0 CT1 TAPERPNT 27 (SUTURE) ×1 IMPLANT
SUTURE STRATFX PDO 1 14 VIOLET (SUTURE) ×1 IMPLANT
SYR 3ML LL SCALE MARK (SYRINGE) ×2 IMPLANT
TRAY FOLEY MTR SLVR 16FR STAT (SET/KITS/TRAYS/PACK) IMPLANT
TRI LOC BPS SZ8 W GRIPTON ×2 IMPLANT
WATER STERILE IRR 1000ML POUR (IV SOLUTION) ×2 IMPLANT
YANKAUER SUCT BULB TIP 10FT TU (MISCELLANEOUS) ×2 IMPLANT

## 2018-09-21 NOTE — Discharge Instructions (Signed)
°Dr. Mercie Balsley °Joint Replacement Specialist °[facility] °[address] °, [place] [address] °[phone] ° ° °TOTAL HIP REPLACEMENT POSTOPERATIVE DIRECTIONS ° ° ° °Hip Rehabilitation, Guidelines Following Surgery  ° °WEIGHT BEARING °Weight bearing as tolerated with assist device (walker, cane, etc) as directed, use it as long as suggested by your surgeon or therapist, typically at least 4-6 weeks. ° °The results of a hip operation are greatly improved after range of motion and muscle strengthening exercises. Follow all safety measures which are given to protect your hip. If any of these exercises cause increased pain or swelling in your joint, decrease the amount until you are comfortable again. Then slowly increase the exercises. Call your caregiver if you have problems or questions.  ° °HOME CARE INSTRUCTIONS  °Most of the following instructions are designed to prevent the dislocation of your new hip.  °Remove items at home which could result in a fall. This includes throw rugs or furniture in walking pathways.  °Continue medications as instructed at time of discharge. °· You may have some home medications which will be placed on hold until you complete the course of blood thinner medication. °· You may start showering once you are discharged home. Do not remove your dressing. °Do not put on socks or shoes without following the instructions of your caregivers.   °Sit on chairs with arms. Use the chair arms to help push yourself up when arising.  °Arrange for the use of a toilet seat elevator so you are not sitting low.  °· Walk with walker as instructed.  °You may resume a sexual relationship in one month or when given the OK by your caregiver.  °Use walker as long as suggested by your caregivers.  °You may put full weight on your legs and walk as much as is comfortable. °Avoid periods of inactivity such as sitting longer than an hour when not asleep. This helps prevent  blood clots.  °You may return to work once you are cleared by your surgeon.  °Do not drive a car for 6 weeks or until released by your surgeon.  °Do not drive while taking narcotics.  °Wear elastic stockings for two weeks following surgery during the day but you may remove then at night.  °Make sure you keep all of your appointments after your operation with all of your doctors and caregivers. You should call the office at the above phone number and make an appointment for approximately two weeks after the date of your surgery. °Please pick up a stool softener and laxative for home use as long as you are requiring pain medications. °· ICE to the affected hip every three hours for 30 minutes at a time and then as needed for pain and swelling. Continue to use ice on the hip for pain and swelling from surgery. You may notice swelling that will progress down to the foot and ankle.  This is normal after surgery.  Elevate the leg when you are not up walking on it.   °It is important for you to complete the blood thinner medication as prescribed by your doctor. °· Continue to use the breathing machine which will help keep your temperature down.  It is common for your temperature to cycle up and down following surgery, especially at night when you are not up moving around and exerting yourself.  The breathing machine keeps your lungs expanded and your temperature down. ° °RANGE OF MOTION AND STRENGTHENING EXERCISES  °These exercises are   designed to help you keep full movement of your hip joint. Follow your caregiver's or physical therapist's instructions. Perform all exercises about fifteen times, three times per day or as directed. Exercise both hips, even if you have had only one joint replacement. These exercises can be done on a training (exercise) mat, on the floor, on a table or on a bed. Use whatever works the best and is most comfortable for you. Use music or television while you are exercising so that the exercises  are a pleasant break in your day. This will make your life better with the exercises acting as a break in routine you can look forward to.  °Lying on your back, slowly slide your foot toward your buttocks, raising your knee up off the floor. Then slowly slide your foot back down until your leg is straight again.  °Lying on your back spread your legs as far apart as you can without causing discomfort.  °Lying on your side, raise your upper leg and foot straight up from the floor as far as is comfortable. Slowly lower the leg and repeat.  °Lying on your back, tighten up the muscle in the front of your thigh (quadriceps muscles). You can do this by keeping your leg straight and trying to raise your heel off the floor. This helps strengthen the largest muscle supporting your knee.  °Lying on your back, tighten up the muscles of your buttocks both with the legs straight and with the knee bent at a comfortable angle while keeping your heel on the floor.  ° °SKILLED REHAB INSTRUCTIONS: °If the patient is transferred to a skilled rehab facility following release from the hospital, a list of the current medications will be sent to the facility for the patient to continue.  When discharged from the skilled rehab facility, please have the facility set up the patient's Home Health Physical Therapy prior to being released. Also, the skilled facility will be responsible for providing the patient with their medications at time of release from the facility to include their pain medication and their blood thinner medication. If the patient is still at the rehab facility at time of the two week follow up appointment, the skilled rehab facility will also need to assist the patient in arranging follow up appointment in our office and any transportation needs. ° °MAKE SURE YOU:  °Understand these instructions.  °Will watch your condition.  °Will get help right away if you are not doing well or get worse. ° °Pick up stool softner and  laxative for home use following surgery while on pain medications. °Do not remove your dressing. °The dressing is waterproof--it is OK to take showers. °Continue to use ice for pain and swelling after surgery. °Do not use any lotions or creams on the incision until instructed by your surgeon. °Total Hip Protocol. ° ° °

## 2018-09-21 NOTE — Anesthesia Procedure Notes (Signed)
Spinal  Patient location during procedure: OR Start time: 09/21/2018 12:44 PM End time: 09/21/2018 12:54 PM Reason for block: at surgeon's request Staffing Anesthesiologist: Albertha Ghee, MD Performed: anesthesiologist  Preanesthetic Checklist Completed: patient identified, site marked, surgical consent, pre-op evaluation, timeout performed, IV checked, risks and benefits discussed and monitors and equipment checked Spinal Block Patient position: sitting Prep: DuraPrep Patient monitoring: heart rate, continuous pulse ox and blood pressure Approach: midline Location: L2-3 Injection technique: single-shot Needle Needle type: Pencan  Needle gauge: 24 G Needle length: 9 cm Assessment Sensory level: T4 Additional Notes Expiration of kit checked and confirmed. Patient tolerated procedure well,without complications, with noted clear CSF. Loss of motor and sensory on exam post injection.

## 2018-09-21 NOTE — Transfer of Care (Signed)
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  Last Pain:  Vitals:   09/21/18 1110  TempSrc: Oral  PainSc: 0-No pain         Complications: No apparent anesthesia complications

## 2018-09-21 NOTE — Anesthesia Preprocedure Evaluation (Signed)
Anesthesia Evaluation  Patient identified by MRN, date of birth, ID band Patient awake    Reviewed: Allergy & Precautions, H&P , NPO status , Patient's Chart, lab work & pertinent test results  Airway Mallampati: II   Neck ROM: full    Dental   Pulmonary sleep apnea ,    breath sounds clear to auscultation       Cardiovascular hypertension, + dysrhythmias  Rhythm:regular Rate:Normal     Neuro/Psych PSYCHIATRIC DISORDERS Anxiety Depression    GI/Hepatic   Endo/Other    Renal/GU      Musculoskeletal  (+) Arthritis ,   Abdominal   Peds  Hematology   Anesthesia Other Findings   Reproductive/Obstetrics                             Anesthesia Physical Anesthesia Plan  ASA: III  Anesthesia Plan: Spinal   Post-op Pain Management:    Induction: Intravenous  PONV Risk Score and Plan: 1 and Treatment may vary due to age or medical condition  Airway Management Planned: Simple Face Mask  Additional Equipment:   Intra-op Plan:   Post-operative Plan:   Informed Consent: I have reviewed the patients History and Physical, chart, labs and discussed the procedure including the risks, benefits and alternatives for the proposed anesthesia with the patient or authorized representative who has indicated his/her understanding and acceptance.       Plan Discussed with: CRNA, Anesthesiologist and Surgeon  Anesthesia Plan Comments:         Anesthesia Quick Evaluation

## 2018-09-21 NOTE — Evaluation (Signed)
Physical Therapy Evaluation Patient Details Name: Brett Rosales MRN: [id number] DOB: 23-Jul-1945 Today's Date: 09/21/2018   History of Present Illness  73 yo male s/p L DA-THA on 09/21/18. PMH includes ascending aortic aneurysm, aflutter, HTN, AO, anxiety/depression.  Clinical Impression  Pt presents with L hip pain, decreased L hip strength post-operatively, increased time and effort to perform mobility tasks, and decreased activity tolerance. Pt to benefit from acute PT to address deficits. Pt ambulated 100 ft with RW with min guard assist, verbal cuing for form and safety provided throughout eval. Pt educated on ankle pumps (20/hour) to perform this afternoon/evening to increase circulation, to pt's tolerance and limited by pain. PT to progress mobility as tolerated, and will continue to follow acutely.        Follow Up Recommendations Follow surgeon's recommendation for DC plan and follow-up therapies;Supervision for mobility/OOB    Equipment Recommendations  Rolling walker with 5" wheels;3in1 (PT)    Recommendations for Other Services       Precautions / Restrictions Precautions Precautions: Fall Restrictions Weight Bearing Restrictions: No Other Position/Activity Restrictions: WBAT      Mobility  Bed Mobility Overal bed mobility: Needs Assistance Bed Mobility: Supine to Sit     Supine to sit: Min assist;HOB elevated     General bed mobility comments: Min assist for LLE lifting, trunk elevation.  Transfers Overall transfer level: Needs assistance Equipment used: Rolling walker (2 wheeled) Transfers: Sit to/from Stand Sit to Stand: Min guard;From elevated surface         General transfer comment: Min guard for safety. VErbal cuing for hand placement when rising.  Ambulation/Gait Ambulation/Gait assistance: Min guard Gait Distance (Feet): 100 Feet Assistive device: Rolling walker (2 wheeled) Gait Pattern/deviations: Step-to pattern;Step-through pattern;Decreased  stride length;Trunk flexed Gait velocity: decr   General Gait Details: Min guard for safety, verbal cuing for sequencing, placement in RW, turning with RW.  Stairs            Wheelchair Mobility    Modified Rankin (Stroke Patients Only)       Balance Overall balance assessment: Mild deficits observed, not formally tested                                           Pertinent Vitals/Pain Pain Assessment: 0-10 Pain Score: 4  Pain Location: L hip, with ambulation Pain Descriptors / Indicators: Sore Pain Intervention(s): Limited activity within patient's tolerance;Monitored during session;Repositioned;RN gave pain meds during session    [place] expects to be discharged to:: Private residence Living Arrangements: Spouse/significant other Available Help at Discharge: Family;Available 24 hours/day Type of Home: House Home Access: Level entry     Home Layout: One level Home Equipment: Shower seat      Prior Function Level of Independence: Independent               Hand Dominance   Dominant Hand: Right    Extremity/Trunk Assessment   Upper Extremity Assessment Upper Extremity Assessment: Overall WFL for tasks assessed    Lower Extremity Assessment Lower Extremity Assessment: Overall WFL for tasks assessed;LLE deficits/detail LLE Deficits / Details: suspected post-surgical hip weakness; able to perform DF/PF, hip and knee flexion/extension AROM LLE Sensation: WNL    Cervical / Trunk Assessment Cervical / Trunk Assessment: Normal  Communication   Communication: No difficulties  Cognition Arousal/Alertness: Awake/alert Behavior During Therapy: WFL for tasks assessed/performed  Overall Cognitive Status: Within Functional Limits for tasks assessed                                 General Comments: Pt very kind and optimistic      General Comments      Exercises     Assessment/Plan    PT Assessment  Patient needs continued PT services  PT Problem List Decreased strength;Decreased mobility;Decreased range of motion;Decreased activity tolerance;Decreased balance;Decreased knowledge of use of DME;Pain       PT Treatment Interventions DME instruction;Therapeutic activities;Gait training;Therapeutic exercise;Patient/family education;Balance training;Stair training;Functional mobility training    PT Goals (Current goals can be found in the Care Plan section)  Acute Rehab PT Goals Patient Stated Goal: go home to wife and 3 cats PT Goal Formulation: With patient Time For Goal Achievement: 09/28/18 Potential to Achieve Goals: Good    Frequency 7X/week   Barriers to discharge        Co-evaluation               AM-PAC PT "6 Clicks" Mobility  Outcome Measure Help needed turning from your back to your side while in a flat bed without using bedrails?: A Little Help needed moving from lying on your back to sitting on the side of a flat bed without using bedrails?: A Little Help needed moving to and from a bed to a chair (including a wheelchair)?: A Little Help needed standing up from a chair using your arms (e.g., wheelchair or bedside chair)?: A Little Help needed to walk in hospital room?: A Little Help needed climbing 3-5 steps with a railing? : A Little 6 Click Score: 18    End of Session Equipment Utilized During Treatment: Gait belt Activity Tolerance: Patient tolerated treatment well Patient left: in chair;with call bell/phone within reach;with chair alarm set;with SCD's reapplied Nurse Communication: Mobility status PT Visit Diagnosis: Other abnormalities of gait and mobility (R26.89);Difficulty in walking, not elsewhere classified (R26.2)    Time: 8882-8003 PT Time Calculation (min) (ACUTE ONLY): 36 min   Charges:   PT Evaluation $PT Eval Low Complexity: 1 Low PT Treatments $Gait Training: 8-22 mins        Julien Girt, PT Acute Rehabilitation Services Pager  [phone]  Office [phone]   Pritika Alvarez D Elonda Husky 09/21/2018, 7:09 PM

## 2018-09-21 NOTE — Interval H&P Note (Signed)
History and Physical Interval Note:  09/21/2018 12:39 PM  Brett Rosales  has presented today for surgery, with the diagnosis of Degenerative joint disease left hip.  The various methods of treatment have been discussed with the patient and family. After consideration of risks, benefits and other options for treatment, the patient has consented to  Procedure(s): TOTAL HIP ARTHROPLASTY ANTERIOR APPROACH (Left) as a surgical intervention.  The patient's history has been reviewed, patient examined, no change in status, stable for surgery.  I have reviewed the patient's chart and labs.  Questions were answered to the patient's satisfaction.     Hilton Cork Mikal Blasdell

## 2018-09-21 NOTE — Anesthesia Procedure Notes (Signed)
Procedure Name: MAC Date/Time: 09/21/2018 12:43 PM Performed by: West Pugh, CRNA Pre-anesthesia Checklist: Patient identified, Emergency Drugs available, Suction available, Patient being monitored and Timeout performed Patient Re-evaluated:Patient Re-evaluated prior to induction Oxygen Delivery Method: Simple face mask Induction Type: IV induction Number of attempts: 1 Placement Confirmation: positive ETCO2 Dental Injury: Teeth and Oropharynx as per pre-operative assessment

## 2018-09-21 NOTE — Op Note (Signed)
OPERATIVE REPORT  SURGEON: Rod Can, MD   ASSISTANT: Sherlean Foot, RNFA.  PREOPERATIVE DIAGNOSIS: Left hip arthritis.   POSTOPERATIVE DIAGNOSIS: Left hip arthritis.   PROCEDURE: Left total hip arthroplasty, anterior approach.   IMPLANTS: DePuy Tri Lock stem, size 8, hi offset. DePuy Pinnacle Cup, size 54 mm. DePuy Altrx liner, size 32 by 54 mm, neutral. DePuy Biolox ceramic head ball, size 32 + 1 mm.  ANESTHESIA:  Spinal  ESTIMATED BLOOD LOSS:-350 mL    ANTIBIOTICS: 2 g Ancef.  DRAINS: None.  COMPLICATIONS: None.   CONDITION: PACU - hemodynamically stable.   BRIEF CLINICAL NOTE: Brett Rosales is a 73 y.o. male with a long-standing history of Left hip arthritis. After failing conservative management, the patient was indicated for total hip arthroplasty. The risks, benefits, and alternatives to the procedure were explained, and the patient elected to proceed.  PROCEDURE IN DETAIL: Surgical site was marked by myself in the pre-op holding area. Once inside the operating room, spinal anesthesia was obtained, and a foley catheter was inserted. The patient was then positioned on the Hana table.  All bony prominences were well padded.  The hip was prepped and draped in the normal sterile surgical fashion.  A time-out was called verifying side and site of surgery. The patient received IV antibiotics within 60 minutes of beginning the procedure.   The direct anterior approach to the hip was performed through the Hueter interval.  Lateral femoral circumflex vessels were treated with the Auqumantys. The anterior capsule was exposed and an inverted T capsulotomy was made. The femoral neck cut was made to the level of the templated cut.  A corkscrew was placed into the head and the head was removed.  The femoral head was found to have eburnated bone. The head was passed to the back table and was measured.   Acetabular exposure was achieved, and the pulvinar and labrum were excised.  Sequential reaming of the acetabulum was then performed up to a size 53 mm reamer. A 54 mm cup was then opened and impacted into place at approximately 40 degrees of abduction and 20 degrees of anteversion. The final polyethylene liner was impacted into place and acetabular osteophytes were removed.    I then gained femoral exposure taking care to protect the abductors and greater trochanter.  This was performed using standard external rotation, extension, and adduction.  The capsule was peeled off the inner aspect of the greater trochanter, taking care to preserve the short external rotators. A cookie cutter was used to enter the femoral canal, and then the femoral canal finder was placed.  Sequential broaching was performed up to a size 8.  Calcar planer was used on the femoral neck remnant.  I placed a hi offset neck and a trial head ball.  The hip was reduced.  Leg lengths and offset were checked fluoroscopically.  The hip was dislocated and trial components were removed.  The final implants were placed, and the hip was reduced.  Fluoroscopy was used to confirm component position and leg lengths.  At 90 degrees of external rotation and full extension, the hip was stable to an anterior directed force.   The wound was copiously irrigated with Irrisept solution and normal saline using pule lavage.  Marcaine solution was injected into the periarticular soft tissue.  The wound was closed in layers using #1 Stratafix for the fascia, 2-0 Vicryl for the subcutaneous fat, 2-0 Monocryl for the deep dermal layer, 3-0 running Monocryl subcuticular stitch, and  Dermabond for the skin.  Once the glue was fully dried, an Aquacell Ag dressing was applied.  The patient was transported to the recovery room in stable condition.  Sponge, needle, and instrument counts were correct at the end of the case x2.  The patient tolerated the procedure well and there were no known complications.

## 2018-09-22 DIAGNOSIS — I110 Hypertensive heart disease with heart failure: Secondary | ICD-10-CM | POA: Diagnosis not present

## 2018-09-22 DIAGNOSIS — M1612 Unilateral primary osteoarthritis, left hip: Secondary | ICD-10-CM | POA: Diagnosis not present

## 2018-09-22 DIAGNOSIS — F419 Anxiety disorder, unspecified: Secondary | ICD-10-CM | POA: Diagnosis not present

## 2018-09-22 DIAGNOSIS — M069 Rheumatoid arthritis, unspecified: Secondary | ICD-10-CM | POA: Diagnosis not present

## 2018-09-22 DIAGNOSIS — Z01812 Encounter for preprocedural laboratory examination: Secondary | ICD-10-CM | POA: Diagnosis not present

## 2018-09-22 DIAGNOSIS — I483 Typical atrial flutter: Secondary | ICD-10-CM | POA: Diagnosis not present

## 2018-09-22 LAB — CBC
HCT: 35 % — ABNORMAL LOW (ref 39.0–52.0)
Hemoglobin: 11.5 g/dL — ABNORMAL LOW (ref 13.0–17.0)
MCH: 33 pg (ref 26.0–34.0)
MCHC: 32.9 g/dL (ref 30.0–36.0)
MCV: 100.6 fL — ABNORMAL HIGH (ref 80.0–100.0)
Platelets: 208 10*3/uL (ref 150–400)
RBC: 3.48 MIL/uL — ABNORMAL LOW (ref 4.22–5.81)
RDW: 13.5 % (ref 11.5–15.5)
WBC: 11.1 10*3/uL — ABNORMAL HIGH (ref 4.0–10.5)
nRBC: 0 % (ref 0.0–0.2)

## 2018-09-22 LAB — BASIC METABOLIC PANEL
Anion gap: 7 (ref 5–15)
BUN: 22 mg/dL (ref 8–23)
CO2: 18 mmol/L — ABNORMAL LOW (ref 22–32)
Calcium: 8.2 mg/dL — ABNORMAL LOW (ref 8.9–10.3)
Chloride: 112 mmol/L — ABNORMAL HIGH (ref 98–111)
Creatinine, Ser: 1.04 mg/dL (ref 0.61–1.24)
GFR calc Af Amer: >60 mL/min (ref >60)
GFR calc non Af Amer: >60 mL/min (ref >60)
Glucose, Bld: 144 mg/dL — ABNORMAL HIGH (ref 70–99)
Potassium: 4.6 mmol/L (ref 3.5–5.1)
Sodium: 137 mmol/L (ref 135–145)

## 2018-09-22 MED ORDER — DOCUSATE SODIUM 100 MG PO CAPS: 100.0000 mg | ORAL_CAPSULE | Freq: Two times a day (BID) | ORAL | 1 refills | Status: DC

## 2018-09-22 MED ORDER — ONDANSETRON HCL 4 MG PO TABS: 4.0000 mg | ORAL_TABLET | Freq: Four times a day (QID) | ORAL | 0 refills | Status: DC | PRN

## 2018-09-22 MED ORDER — HYDROCODONE-ACETAMINOPHEN 5-325 MG PO TABS: 1.0000 | ORAL_TABLET | ORAL | 0 refills | Status: DC | PRN

## 2018-09-22 MED ORDER — SENNA 8.6 MG PO TABS: 1.0000 | ORAL_TABLET | Freq: Two times a day (BID) | ORAL | 0 refills | Status: DC

## 2018-09-22 NOTE — Care Management Obs Status (Signed)
[place] NOTIFICATION   Patient Details  Name: Brett Rosales MRN: [id number] Date of Birth: 12-Oct-1945   Medicare Observation Status Notification Given:  Yes    Lia Hopping, [place] 09/22/2018, 2:38 PM

## 2018-09-22 NOTE — Anesthesia Postprocedure Evaluation (Signed)
Anesthesia Post Note  Patient: Brett Rosales  Procedure(s) Performed: TOTAL HIP ARTHROPLASTY ANTERIOR APPROACH (Left Hip)     Patient location during evaluation: PACU Anesthesia Type: Spinal Level of consciousness: oriented and awake and alert Pain management: pain level controlled Vital Signs Assessment: post-procedure vital signs reviewed and stable Respiratory status: spontaneous breathing, respiratory function stable and patient connected to nasal cannula oxygen Cardiovascular status: blood pressure returned to baseline and stable Postop Assessment: no headache, no backache and no apparent nausea or vomiting Anesthetic complications: no    Last Vitals:  Vitals:   09/22/18 0030 09/22/18 0501  BP: 104/66 121/72  Pulse: 68 (!) 56  Resp: 16 16  Temp: 36.6 C 36.6 C  SpO2: 98% 100%    Last Pain:  Vitals:   09/22/18 0758  TempSrc:   PainSc: 3    Pain Goal:                   Kaaren Nass S

## 2018-09-22 NOTE — Progress Notes (Signed)
Physical Therapy Progress Note  Clinical Impression: Pt seen for LE strengthening therex. PT provided HEP handout and reviewed in full with pt. PT provided pt education re: ice, generalized walking program and car transfers with demonstration. Pt would continue to benefit from skilled physical therapy services at this time while admitted and after d/c to address the below listed limitations in order to improve overall safety and independence with functional mobility.  Sherie Don, [place], DPT  Acute Rehabilitation Services Pager [phone] Office [phone]    09/22/18 1000  PT Visit Information  Last PT Received On 09/22/18  Assistance Needed +1  History of Present Illness Pt is a 73 y/o male s/p L THA-direct anterior on 09/21/18. PMH includes ascending aortic aneurysm, aflutter, HTN, AO, anxiety/depression.  Subjective Data  Patient Stated Goal to go up and down the stairs with no pain  Precautions  Precautions Fall  Restrictions  Weight Bearing Restrictions No  Pain Assessment  Pain Assessment No/denies pain  Cognition  Arousal/Alertness Awake/alert  Behavior During Therapy [facility] for tasks assessed/performed  Overall Cognitive Status Within Functional Limits for tasks assessed  General Comments very pleasant and eager to work with therapist  Exercises  Exercises Total Joint  Total Joint Exercises  Ankle Circles/Pumps AROM;Both;20 reps;Supine  Quad Sets AROM;Strengthening;Left;10 reps;Supine  Gluteal Sets AROM;Strengthening;Both;10 reps;Supine  Short Arc Quad AROM;Strengthening;Left;10 reps;Supine  Heel Slides AROM;Strengthening;Left;10 reps;Supine  Hip ABduction/ADduction AROM;Strengthening;Left;10 reps;Supine  Straight Leg Raises AROM;Strengthening;Left;10 reps;Supine  PT - End of Session  Activity Tolerance Patient tolerated treatment well  Patient left in bed;with call bell/phone within reach  Nurse Communication Mobility status   PT - Assessment/Plan  PT Plan  Current plan remains appropriate  PT Visit Diagnosis Other abnormalities of gait and mobility (R26.89);Difficulty in walking, not elsewhere classified (R26.2)  PT Frequency (ACUTE ONLY) 7X/week  Follow Up Recommendations Supervision for mobility/OOB (home with HEP)  PT equipment Rolling walker with 5" wheels;3in1 (PT)  AM-PAC PT "6 Clicks" Mobility Outcome Measure (Version 2)  Help needed turning from your back to your side while in a flat bed without using bedrails? 3  Help needed moving from lying on your back to sitting on the side of a flat bed without using bedrails? 3  Help needed moving to and from a bed to a chair (including a wheelchair)? 3  Help needed standing up from a chair using your arms (e.g., wheelchair or bedside chair)? 3  Help needed to walk in hospital room? 3  Help needed climbing 3-5 steps with a railing?  3  6 Click Score 18  Consider Recommendation of Discharge To: Home with [facility]  PT Goal Progression  Progress towards PT goals Progressing toward goals  Acute Rehab PT Goals  PT Goal Formulation With patient  Time For Goal Achievement 09/28/18  Potential to Achieve Goals Good  PT Time Calculation  PT Start Time (ACUTE ONLY) 0836  PT Stop Time (ACUTE ONLY) 0902  PT Time Calculation (min) (ACUTE ONLY) 26 min  PT General Charges  $$ ACUTE PT VISIT 1 Visit  PT Treatments  $Therapeutic Exercise 23-37 mins

## 2018-09-22 NOTE — TOC Transition Note (Signed)
Transition of Care [facility]) - CM/SW Discharge Note   Patient Details  Name: Brett Rosales MRN: [id number] Date of Birth: June 01, 1945  Transition of Care [facility]) CM/SW Contact:  Lia Hopping, [place] Phone Number: 09/22/2018, 10:10 AM   Clinical Narrative:    HEP Rolling Walker and 3 in 1 ordered through [place]    Final next level of care: Home/Self Care Barriers to Discharge: No Barriers Identified   Patient Goals and CMS Choice        Discharge Placement  Home                      Discharge Plan and Services                DME Arranged: 3-N-1, Walker rolling DME Agency: [employer] Date DME Agency Contacted: 09/22/18 Time DME Agency Contacted: 0900 Representative spoke with at DME Agency: [place] ([place]) Interventions     Readmission Risk Interventions No flowsheet data found.

## 2018-09-22 NOTE — Discharge Summary (Signed)
Physician Discharge Summary  Patient ID: Brett Rosales MRN: [id number] DOB/AGE: [birth year] 73 y.o.  Admit date: 09/21/2018 Discharge date: 09/22/2018  Admission Diagnoses:  Osteoarthritis of left hip  Discharge Diagnoses:  Principal Problem:   Osteoarthritis of left hip   Past Medical History:  Diagnosis Date  . Anxiety    medication made him feel flat stopped meds 09/07/2018  . Arthritis   . Depression   . Dysrhythmia 11/2017  . History of kidney stones    13 stones  . Hypertension   . Sleep apnea 03/2018   Had study after episode of disrhythmia. it was inconclusive. needs follow up    Surgeries: Procedure(s): TOTAL HIP ARTHROPLASTY ANTERIOR APPROACH on 09/21/2018   Consultants (if any):   Discharged Condition: Improved  Hospital Course: Brett Rosales is an 73 y.o. male who was admitted 09/21/2018 with a diagnosis of Osteoarthritis of left hip and went to the operating room on 09/21/2018 and underwent the above named procedures.    He was given perioperative antibiotics:  Anti-infectives (From admission, onward)   Start     Dose/Rate Route Frequency Ordered Stop   09/21/18 1800  ceFAZolin (ANCEF) IVPB 2g/100 mL premix     2 g 200 mL/hr over 30 Minutes Intravenous Every 6 hours 09/21/18 1616 09/21/18 2341   09/21/18 1115  ceFAZolin (ANCEF) IVPB 2g/100 mL premix     2 g 200 mL/hr over 30 Minutes Intravenous On call to O.R. 09/21/18 1109 09/21/18 1325    .  He was given sequential compression devices, early ambulation, and xarelto for DVT prophylaxis.  He benefited maximally from the hospital stay and there were no complications.    Recent vital signs:  Vitals:   09/22/18 0501 09/22/18 1034  BP: 121/72 121/72  Pulse: (!) 56 70  Resp: 16   Temp: 97.8 F (36.6 C) 98.2 F (36.8 C)  SpO2: 100% 100%    Recent laboratory studies:  Lab Results  Component Value Date   HGB 11.5 (L) 09/22/2018   HGB 13.7 09/20/2018   Lab Results  Component Value Date   WBC 11.1  (H) 09/22/2018   PLT 208 09/22/2018   Lab Results  Component Value Date   INR 0.9 09/20/2018   Lab Results  Component Value Date   NA 137 09/22/2018   K 4.6 09/22/2018   CL 112 (H) 09/22/2018   CO2 18 (L) 09/22/2018   BUN 22 09/22/2018   CREATININE 1.04 09/22/2018   GLUCOSE 144 (H) 09/22/2018    Discharge Medications:   Allergies as of 09/22/2018      Reactions   Nsaids Other (See Comments)   "Eats hole in stomach".Marland KitchenMarland KitchenMarland KitchenIbuprofen, Meloxicam...   Statins Other (See Comments)   Myalgias      Medication List    STOP taking these medications   Actemra 400 MG/20ML Soln injection Generic drug: tocilizumab   methotrexate 50 MG/2ML injection   traMADol 50 MG tablet Commonly known as: ULTRAM     TAKE these medications   docusate sodium 100 MG capsule Commonly known as: COLACE Take 1 capsule (100 mg total) by mouth 2 (two) times daily.   ezetimibe 10 MG tablet Commonly known as: ZETIA Take 10 mg by mouth daily.   folic acid 1 MG tablet Commonly known as: FOLVITE Take 1 mg by mouth daily.   furosemide 20 MG tablet Commonly known as: LASIX Take 1 tablet (20 mg total) by mouth daily. What changed:   when to take this  reasons to take this   HYDROcodone-acetaminophen 5-325 MG tablet Commonly known as: NORCO/VICODIN Take 1-2 tablets by mouth every 4 (four) hours as needed for moderate pain (pain score 4-6).   losartan 100 MG tablet Commonly known as: COZAAR Take 100 mg by mouth daily.   methylphenidate 20 MG tablet Commonly known as: RITALIN Take 20 mg by mouth 2 (two) times a day.   metoprolol tartrate 25 MG tablet Commonly known as: LOPRESSOR Take 1 tablet (25 mg total) by mouth 2 (two) times daily.   ondansetron 4 MG tablet Commonly known as: ZOFRAN Take 1 tablet (4 mg total) by mouth every 6 (six) hours as needed for nausea.   rivaroxaban 20 MG Tabs tablet Commonly known as: Xarelto Take 1 tablet (20 mg total) by mouth daily with supper.   senna  8.6 MG Tabs tablet Commonly known as: SENOKOT Take 1 tablet (8.6 mg total) by mouth 2 (two) times daily.       Diagnostic Studies: Dg Pelvis Portable  Result Date: 09/21/2018 CLINICAL DATA:  S/p THA today, image done in recovery. EXAM: PORTABLE PELVIS 1-2 VIEWS COMPARISON:  None. FINDINGS: Status post LEFT hip arthroplasty. The femoral head component appears well seated in the acetabular component on the portable view. IMPRESSION: Status post LEFT hip arthroplasty. Electronically Signed   By: Nolon Nations M.D.   On: 09/21/2018 16:07   Dg C-arm 1-60 Min-no Report  Result Date: 09/21/2018 CLINICAL DATA:  Left hip arthroplasty EXAM: OPERATIVE LEFT HIP (WITH PELVIS IF PERFORMED) 2 VIEWS TECHNIQUE: Fluoroscopic spot image(s) were submitted for interpretation post-operatively. COMPARISON:  None. FINDINGS: Changes of left hip replacement. Normal AP alignment. No visible hardware or bony complicating feature. IMPRESSION: Left hip replacement.  No visible complicating feature. Electronically Signed   By: Rolm Baptise M.D.   On: 09/21/2018 15:41   Dg Hip Operative Unilat W Or W/o Pelvis Left  Result Date: 09/21/2018 CLINICAL DATA:  Left hip arthroplasty EXAM: OPERATIVE LEFT HIP (WITH PELVIS IF PERFORMED) 2 VIEWS TECHNIQUE: Fluoroscopic spot image(s) were submitted for interpretation post-operatively. COMPARISON:  None. FINDINGS: Changes of left hip replacement. Normal AP alignment. No visible hardware or bony complicating feature. IMPRESSION: Left hip replacement.  No visible complicating feature. Electronically Signed   By: Rolm Baptise M.D.   On: 09/21/2018 15:41    Disposition:    Discharge Instructions    Call MD / Call 911   Complete by: As directed    If you experience chest pain or shortness of breath, CALL 911 and be transported to the hospital emergency room.  If you develope a fever above 101 F, pus (white drainage) or increased drainage or redness at the wound, or calf pain, call your  surgeon's office.   Constipation Prevention   Complete by: As directed    Drink plenty of fluids.  Prune juice may be helpful.  You may use a stool softener, such as Colace (over the counter) 100 mg twice a day.  Use MiraLax (over the counter) for constipation as needed.   Diet - low sodium heart healthy   Complete by: As directed    Increase activity slowly as tolerated   Complete by: As directed    TED hose   Complete by: As directed    Use stockings (TED hose) for 2 weeks on both leg(s).  You may remove them at night for sleeping.      Follow-up Information    Johnathan Tortorelli, Aaron Edelman, MD. Schedule an appointment as soon as  possible for a visit in 2 weeks.   Specialty: Orthopedic Surgery Why: For wound re-check Contact information: [address] [place] [address] [phone]            Signed: Hilton Cork Laronda Lisby 09/22/2018, 8:53 PM

## 2018-09-22 NOTE — Progress Notes (Signed)
    Subjective:  Patient reports pain as mild to moderate.  Denies N/V/CP/SOB. No c/o.  Objective:   VITALS:   Vitals:   09/21/18 2110 09/22/18 0030 09/22/18 0501 09/22/18 1034  BP: 110/71 104/66 121/72 121/72  Pulse: 76 68 (!) 56 70  Resp: 16 16 16    Temp: 98.3 F (36.8 C) 97.8 F (36.6 C) 97.8 F (36.6 C) 98.2 F (36.8 C)  TempSrc: Oral Oral Oral Oral  SpO2: 97% 98% 100% 100%  Weight:      Height:        NAD ABD soft Sensation intact distally Intact pulses distally Dorsiflexion/Plantar flexion intact Incision: dressing C/D/I Compartment soft   Lab Results  Component Value Date   WBC 11.1 (H) 09/22/2018   HGB 11.5 (L) 09/22/2018   HCT 35.0 (L) 09/22/2018   MCV 100.6 (H) 09/22/2018   PLT 208 09/22/2018   BMET    Component Value Date/Time   NA 137 09/22/2018 0307   K 4.6 09/22/2018 0307   CL 112 (H) 09/22/2018 0307   CO2 18 (L) 09/22/2018 0307   GLUCOSE 144 (H) 09/22/2018 0307   BUN 22 09/22/2018 0307   CREATININE 1.04 09/22/2018 0307   CALCIUM 8.2 (L) 09/22/2018 0307   GFRNONAA >60 09/22/2018 0307   GFRAA >60 09/22/2018 0307     Assessment/Plan: 1 Day Post-Op   Principal Problem:   Osteoarthritis of left hip   WBAT with walker DVT ppx: Xarelto, SCDs, TEDS PO pain control PT/OT Dispo: D/C home with HEP, he improved quicker than expected   Bertram Savin 09/22/2018, 3:04 PM   Rod Can, MD Cell: [phone] Progress is now [facility]  [place] [address], [place], [place], [place] [address] Phone: [phone] [email or web address] Facebook  Engineer, structural    1

## 2018-09-22 NOTE — Progress Notes (Signed)
Physical Therapy Treatment Patient Details Name: Brett Rosales MRN: [id number] DOB: 1945-07-14 Today's Date: 09/22/2018    History of Present Illness Pt is a 73 y/o male s/p L THA-direct anterior on 09/21/18. PMH includes ascending aortic aneurysm, aflutter, HTN, AO, anxiety/depression.    PT Comments    Pt ambulated in hallway and performed standing exercises.  Pt feels ready for d/c home and had no further questions.   Follow Up Recommendations  Supervision for mobility/OOB;Follow surgeon's recommendation for DC plan and follow-up therapies     Equipment Recommendations  Rolling walker with 5" wheels;3in1 (PT)    Recommendations for Other Services       Precautions / Restrictions Precautions Precautions: Fall Restrictions Weight Bearing Restrictions: No Other Position/Activity Restrictions: WBAT    Mobility  Bed Mobility Overal bed mobility: Needs Assistance Bed Mobility: Supine to Sit     Supine to sit: Supervision;HOB elevated        Transfers Overall transfer level: Needs assistance Equipment used: Rolling walker (2 wheeled) Transfers: Sit to/from Stand Sit to Stand: Min guard;Supervision         General transfer comment: verbal cues for UE and LE positioning  Ambulation/Gait Ambulation/Gait assistance: Min guard;Supervision Gait Distance (Feet): 200 Feet Assistive device: Rolling walker (2 wheeled) Gait Pattern/deviations: Step-through pattern;Decreased stance time - left;Antalgic Gait velocity: decr   General Gait Details: verbal cues for step length and RW positioning   Stairs             Wheelchair Mobility    Modified Rankin (Stroke Patients Only)       Balance                                            Cognition Arousal/Alertness: Awake/alert Behavior During Therapy: WFL for tasks assessed/performed Overall Cognitive Status: Within Functional Limits for tasks assessed                                  General Comments: very pleasant and eager to work with therapist      Exercises Total Joint Exercises  Hip ABduction/ADduction: AROM;Left;10 reps;Standing Knee Flexion: AROM;Left;10 reps;Standing Marching in Standing: AROM;10 reps;Left;Standing Standing Hip Extension: Standing;AROM;Left;10 reps    General Comments        Pertinent Vitals/Pain Pain Assessment: 0-10 Pain Score: 4  Pain Location: L hip, with ambulation Pain Descriptors / Indicators: Sore Pain Intervention(s): Monitored during session;Repositioned;Premedicated before session    Home Living                      Prior Function            PT Goals (current goals can now be found in the care plan section) Acute Rehab PT Goals Patient Stated Goal: to go up and down the stairs with no pain PT Goal Formulation: With patient Time For Goal Achievement: 09/28/18 Potential to Achieve Goals: Good Progress towards PT goals: Progressing toward goals    Frequency    7X/week      PT Plan Current plan remains appropriate    Co-evaluation              AM-PAC PT "6 Clicks" Mobility   Outcome Measure  Help needed turning from your back to your side while in a flat bed without using  bedrails?: A Little Help needed moving from lying on your back to sitting on the side of a flat bed without using bedrails?: A Little Help needed moving to and from a bed to a chair (including a wheelchair)?: A Little Help needed standing up from a chair using your arms (e.g., wheelchair or bedside chair)?: A Little Help needed to walk in hospital room?: A Little Help needed climbing 3-5 steps with a railing? : A Little 6 Click Score: 18    End of Session Equipment Utilized During Treatment: Gait belt Activity Tolerance: Patient tolerated treatment well Patient left: with call bell/phone within reach;in chair Nurse Communication: Mobility status PT Visit Diagnosis: Other abnormalities of gait and mobility  (R26.89);Difficulty in walking, not elsewhere classified (R26.2)     Time: 9728-2060 PT Time Calculation (min) (ACUTE ONLY): 18 min  Charges:  $Gait Training: 8-22 mins                      Carmelia Bake, PT, DPT Acute Rehabilitation Services Office: [phone] Pager: [phone]  Trena Platt 09/22/2018, 12:48 PM

## 2018-09-22 NOTE — Plan of Care (Signed)
Patient discharged home in stable condition 

## 2018-09-22 NOTE — Plan of Care (Signed)
  Problem: Pain Managment: Goal: General experience of comfort will improve 09/22/2018 0650 by Sheilah Mins, RN Outcome: Progressing 09/22/2018 0649 by Sheilah Mins, RN Outcome: Progressing   Problem: Activity: Goal: Risk for activity intolerance will decrease 09/22/2018 0650 by Sheilah Mins, RN Outcome: Progressing 09/22/2018 0649 by Sheilah Mins, RN Outcome: Progressing   Problem: Nutrition: Goal: Adequate nutrition will be maintained 09/22/2018 0650 by Sheilah Mins, RN Outcome: Progressing 09/22/2018 [place] by Sheilah Mins, RN Outcome: Progressing   Problem: Coping: Goal: Level of anxiety will decrease 09/22/2018 0650 by Sheilah Mins, RN Outcome: Progressing 09/22/2018 [place] by Sheilah Mins, RN Outcome: Progressing

## 2018-09-22 NOTE — Plan of Care (Signed)
  Problem: Education: Goal: Knowledge of General Education information will improve Description: Including pain rating scale, medication(s)/side effects and non-pharmacologic comfort measures Outcome: Progressing   Problem: Health Behavior/Discharge Planning: Goal: Ability to manage health-related needs will improve Outcome: Progressing   Problem: Activity: Goal: Risk for activity intolerance will decrease Outcome: Progressing   Problem: Nutrition: Goal: Adequate nutrition will be maintained Outcome: Progressing   Problem: Pain Managment: Goal: General experience of comfort will improve Outcome: Progressing   Problem: Pain Managment: Goal: General experience of comfort will improve Outcome: Progressing

## 2018-09-25 ENCOUNTER — Encounter (HOSPITAL_COMMUNITY): Payer: Self-pay | Admitting: Orthopedic Surgery

## 2018-09-25 ENCOUNTER — Ambulatory Visit: Payer: Medicare Other | Admitting: Cardiology

## 2018-09-28 DIAGNOSIS — Z96642 Presence of left artificial hip joint: Secondary | ICD-10-CM | POA: Insufficient documentation

## 2018-09-28 HISTORY — DX: Presence of left artificial hip joint: Z96.642

## 2018-10-06 DIAGNOSIS — Z96642 Presence of left artificial hip joint: Secondary | ICD-10-CM | POA: Diagnosis not present

## 2018-10-06 DIAGNOSIS — Z471 Aftercare following joint replacement surgery: Secondary | ICD-10-CM | POA: Diagnosis not present

## 2018-10-31 DIAGNOSIS — M2550 Pain in unspecified joint: Secondary | ICD-10-CM | POA: Diagnosis not present

## 2018-10-31 DIAGNOSIS — M545 Low back pain: Secondary | ICD-10-CM | POA: Diagnosis not present

## 2018-10-31 DIAGNOSIS — M0609 Rheumatoid arthritis without rheumatoid factor, multiple sites: Secondary | ICD-10-CM | POA: Diagnosis not present

## 2018-10-31 DIAGNOSIS — Z6835 Body mass index (BMI) 35.0-35.9, adult: Secondary | ICD-10-CM | POA: Diagnosis not present

## 2018-10-31 DIAGNOSIS — Z79899 Other long term (current) drug therapy: Secondary | ICD-10-CM | POA: Diagnosis not present

## 2018-10-31 DIAGNOSIS — E669 Obesity, unspecified: Secondary | ICD-10-CM | POA: Diagnosis not present

## 2018-10-31 DIAGNOSIS — M150 Primary generalized (osteo)arthritis: Secondary | ICD-10-CM | POA: Diagnosis not present

## 2018-11-03 DIAGNOSIS — Z471 Aftercare following joint replacement surgery: Secondary | ICD-10-CM | POA: Diagnosis not present

## 2018-11-03 DIAGNOSIS — Z96642 Presence of left artificial hip joint: Secondary | ICD-10-CM | POA: Diagnosis not present

## 2018-11-06 DIAGNOSIS — M0609 Rheumatoid arthritis without rheumatoid factor, multiple sites: Secondary | ICD-10-CM | POA: Diagnosis not present

## 2018-12-05 DIAGNOSIS — M0609 Rheumatoid arthritis without rheumatoid factor, multiple sites: Secondary | ICD-10-CM | POA: Diagnosis not present

## 2018-12-05 DIAGNOSIS — Z79899 Other long term (current) drug therapy: Secondary | ICD-10-CM | POA: Diagnosis not present

## 2018-12-14 DIAGNOSIS — Z23 Encounter for immunization: Secondary | ICD-10-CM | POA: Diagnosis not present

## 2018-12-21 DIAGNOSIS — Z79899 Other long term (current) drug therapy: Secondary | ICD-10-CM | POA: Diagnosis not present

## 2018-12-21 DIAGNOSIS — R739 Hyperglycemia, unspecified: Secondary | ICD-10-CM | POA: Diagnosis not present

## 2018-12-21 DIAGNOSIS — E785 Hyperlipidemia, unspecified: Secondary | ICD-10-CM | POA: Diagnosis not present

## 2019-01-02 DIAGNOSIS — M0609 Rheumatoid arthritis without rheumatoid factor, multiple sites: Secondary | ICD-10-CM | POA: Diagnosis not present

## 2019-01-05 DIAGNOSIS — F329 Major depressive disorder, single episode, unspecified: Secondary | ICD-10-CM | POA: Diagnosis not present

## 2019-01-05 DIAGNOSIS — I10 Essential (primary) hypertension: Secondary | ICD-10-CM | POA: Diagnosis not present

## 2019-01-05 DIAGNOSIS — E7800 Pure hypercholesterolemia, unspecified: Secondary | ICD-10-CM | POA: Diagnosis not present

## 2019-01-12 DIAGNOSIS — E669 Obesity, unspecified: Secondary | ICD-10-CM | POA: Diagnosis not present

## 2019-01-12 DIAGNOSIS — M0609 Rheumatoid arthritis without rheumatoid factor, multiple sites: Secondary | ICD-10-CM | POA: Diagnosis not present

## 2019-01-12 DIAGNOSIS — Z6836 Body mass index (BMI) 36.0-36.9, adult: Secondary | ICD-10-CM | POA: Diagnosis not present

## 2019-01-12 DIAGNOSIS — M2550 Pain in unspecified joint: Secondary | ICD-10-CM | POA: Diagnosis not present

## 2019-01-12 DIAGNOSIS — M545 Low back pain: Secondary | ICD-10-CM | POA: Diagnosis not present

## 2019-01-12 DIAGNOSIS — Z79899 Other long term (current) drug therapy: Secondary | ICD-10-CM | POA: Diagnosis not present

## 2019-01-12 DIAGNOSIS — M150 Primary generalized (osteo)arthritis: Secondary | ICD-10-CM | POA: Diagnosis not present

## 2019-01-23 ENCOUNTER — Ambulatory Visit (INDEPENDENT_AMBULATORY_CARE_PROVIDER_SITE_OTHER): Payer: Medicare Other | Admitting: Cardiology

## 2019-01-23 ENCOUNTER — Encounter: Payer: Self-pay | Admitting: Cardiology

## 2019-01-23 ENCOUNTER — Other Ambulatory Visit: Payer: Self-pay

## 2019-01-23 VITALS — BP 124/84 | HR 60 | Ht 74.0 in | Wt 278.4 lb

## 2019-01-23 DIAGNOSIS — I10 Essential (primary) hypertension: Secondary | ICD-10-CM | POA: Diagnosis not present

## 2019-01-23 DIAGNOSIS — I483 Typical atrial flutter: Secondary | ICD-10-CM | POA: Diagnosis not present

## 2019-01-23 DIAGNOSIS — R0609 Other forms of dyspnea: Secondary | ICD-10-CM

## 2019-01-23 DIAGNOSIS — R0600 Dyspnea, unspecified: Secondary | ICD-10-CM | POA: Diagnosis not present

## 2019-01-23 DIAGNOSIS — M069 Rheumatoid arthritis, unspecified: Secondary | ICD-10-CM | POA: Diagnosis not present

## 2019-01-23 DIAGNOSIS — M7989 Other specified soft tissue disorders: Secondary | ICD-10-CM

## 2019-01-23 DIAGNOSIS — E785 Hyperlipidemia, unspecified: Secondary | ICD-10-CM | POA: Insufficient documentation

## 2019-01-23 HISTORY — DX: Hyperlipidemia, unspecified: E78.5

## 2019-01-23 NOTE — Progress Notes (Signed)
Cardiology Office Note:    Date:  01/23/2019   ID:  RYZE BACKE, DOB [birth year], MRN [id number]  PCP:  Ronita Hipps, MD  Cardiologist:  Jenne Campus, MD    Referring MD: Ronita Hipps, MD   Chief Complaint  Patient presents with  . Follow-up    History of Present Illness:    Brett Rosales is a 73 y.o. male with paroxysmal atrial flutter.  He is anticoagulated, his chads 2 vascular equals 2.  Overall he is doing very well he did have hip replacement surgery and he is feeling great after that he is very happy that he got surgery.  Surgery was uneventful he said he woke after surgery feeling better right away.  Denies having a palpitations no dizziness no swelling of lower extremities overall does very well.  Concern is his dyslipidemia.  I do have results of his fasting lipid profile with LDL of 162 likely his HDL is elevated 73 we talked today at length about potentially starting some medication for his cholesterol he takes Zetia only he is intolerant to statin the question is is there any role for PCSK9 agent for him I think would help [place] to make this decision is better risk stratification for him and I think doing calcium score will be beneficial.  Past Medical History:  Diagnosis Date  . Anxiety    medication made him feel flat stopped meds 09/07/2018  . Arthritis   . Depression   . Dysrhythmia 11/2017  . History of kidney stones    13 stones  . Hypertension   . Sleep apnea 03/2018   Had study after episode of disrhythmia. it was inconclusive. needs follow up    Past Surgical History:  Procedure Laterality Date  . TONSILLECTOMY AND ADENOIDECTOMY    . 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 ezetimibe (ZETIA) 10 MG tablet Take 10 mg by mouth daily.   . folic acid (FOLVITE) 1 MG tablet Take 1 mg by mouth daily.  Marland Kitchen losartan (COZAAR) 100 MG tablet Take 100 mg by mouth daily.  . methylphenidate (RITALIN) 20 MG tablet Take 20 mg by mouth 2 (two) times a day.   . metoprolol tartrate (LOPRESSOR) 25 MG tablet Take 1 tablet (25 mg total) by mouth 2 (two) times daily.  . rivaroxaban (XARELTO) 20 MG TABS tablet Take 1 tablet (20 mg total) by mouth daily with supper.  . traMADol (ULTRAM) 50 MG tablet Take 50 mg by mouth every 6 (six) hours as needed.     Allergies:   Nsaids and Statins   Social History   Socioeconomic History  . Marital status: Married    Spouse name: Not on file  . Number of children: Not on file  . Years of education: Not on file  . Highest education level: Not on file  Occupational History  . Not on file  Social Needs  . Financial resource strain: Not on file  . Food insecurity    Worry: Not on file    Inability: Not on file  . Transportation needs    Medical: Not on file    Non-medical: Not on file  Tobacco Use  . Smoking status: Never Smoker  . Smokeless tobacco: Never Used  Substance and Sexual  Activity  . Alcohol use: No    Alcohol/week: 0.0 standard drinks  . Drug use: No  . Sexual activity: Not on file  Lifestyle  . Physical activity    Days per week: Not on file    Minutes per session: Not on file  . Stress: Not on file  Relationships  . Social Herbalist on phone: Not on file    Gets together: Not on file    Attends religious service: Not on file    Active member of club or organization: Not on file    Attends meetings of clubs or organizations: Not on file    Relationship status: Not on file  Other Topics Concern  . Not on file  Social History Narrative  . Not on file     Family History: The patient's family history includes Cancer in his mother; Diabetes in his mother; Hypertension in his father. ROS:   Please see the history of present illness.    All 14 point review of  systems negative except as described per history of present illness  EKGs/Labs/Other Studies Reviewed:      Recent Labs: 09/20/2018: ALT 28 09/22/2018: BUN 22; Creatinine, Ser 1.04; Hemoglobin 11.5; Platelets 208; Potassium 4.6; Sodium 137  Recent Lipid Panel No results found for: CHOL, TRIG, HDL, CHOLHDL, VLDL, LDLCALC, LDLDIRECT  Physical Exam:    VS:  BP 124/84   Pulse 60   Ht 6\' 2"  (1.88 m)   Wt 278 lb 6.4 oz (126.3 kg)   SpO2 98%   BMI 35.74 kg/m     Wt Readings from Last 3 Encounters:  01/23/19 278 lb 6.4 oz (126.3 kg)  09/21/18 271 lb 1.6 oz (123 kg)  09/20/18 271 lb 1.6 oz (123 kg)     GEN:  Well nourished, well developed in no acute distress HEENT: Normal NECK: No JVD; No carotid bruits LYMPHATICS: No lymphadenopathy CARDIAC: RRR, no murmurs, no rubs, no gallops RESPIRATORY:  Clear to auscultation without rales, wheezing or rhonchi  ABDOMEN: Soft, non-tender, non-distended MUSCULOSKELETAL:  No edema; No deformity  SKIN: Warm and dry LOWER EXTREMITIES: no swelling NEUROLOGIC:  Alert and oriented x 3 PSYCHIATRIC:  Normal affect   ASSESSMENT:    1. Typical atrial flutter ([place])   2. Essential hypertension   3. Rheumatoid arthritis involving shoulder, unspecified laterality, unspecified whether rheumatoid factor present ([place])   4. Swelling of left lower extremity   5. Dyspnea on exertion   6. Dyslipidemia    PLAN:    In order of problems listed above:  1. Typical atrial flutter denies have any problem.  Anticoagulated which I will continue. 2. Essential hypertension blood pressure well controlled continue present management 3. Rheumatoid arthritis stable 4. Swelling of lower extremities not anymore. 5. Dyspnea on exertion doing well from that point review 6. Dyslipidemia we will schedule him to have a calcium score to decide about potential therapy.  He is intolerant to statin he is only on Zetia he may benefit from PCSK9 agent.  We will make this  assessment based on the results of his calcium score.  Will use Mesa score for calculations of risk.   Medication Adjustments/Labs and Tests Ordered: Current medicines are reviewed at length with the patient today.  Concerns regarding medicines are outlined above.  No orders of the defined types were placed in this encounter.  Medication changes: No orders of the defined types were placed in this encounter.   Signed, Park Liter,  MD, [facility] 01/23/2019 11:31 AM    [place]

## 2019-01-23 NOTE — Addendum Note (Signed)
Addended by: Ashok Norris on: 01/23/2019 11:46 AM   Modules accepted: Orders

## 2019-01-23 NOTE — Patient Instructions (Signed)
Medication Instructions:  Your physician recommends that you continue on your current medications as directed. Please refer to the Current Medication list given to you today.  *If you need a refill on your cardiac medications before your next appointment, please call your pharmacy*  Lab Work: None.  If you have labs (blood work) drawn today and your tests are completely normal, you will receive your results only by: Marland Kitchen MyChart Message (if you have MyChart) OR . A paper copy in the mail If you have any lab test that is abnormal or we need to change your treatment, we will call you to review the results.  Testing/Procedures: Non-Cardiac CT scanning, (CAT scanning), is a noninvasive, special x-ray that produces cross-sectional images of the body using x-rays and a computer. CT scans help physicians diagnose and treat medical conditions. For some CT exams, a contrast material is used to enhance visibility in the area of the body being studied. CT scans provide greater clarity and reveal more details than regular x-ray exams.    Follow-Up: At [facility], you and your health needs are our priority.  As part of our continuing mission to provide you with exceptional heart care, we have created designated Provider Care Teams.  These Care Teams include your primary Cardiologist (physician) and Advanced Practice Providers (APPs -  Physician Assistants and Nurse Practitioners) who all work together to provide you with the care you need, when you need it.  Your next appointment:   5 months  The format for your next appointment:   In Person  Provider:   Jenne Campus, MD  Other Instructions   Coronary Calcium Scan A coronary calcium scan is an imaging test used to look for deposits of calcium and other fatty materials (plaques) in the inner lining of the blood vessels of the heart (coronary arteries). These deposits of calcium and plaques can partly clog and narrow the coronary arteries  without producing any symptoms or warning signs. This puts a person at risk for a heart attack. This test can detect these deposits before symptoms develop. Tell a health care provider about:  Any allergies you have.  All medicines you are taking, including vitamins, herbs, eye drops, creams, and over-the-counter medicines.  Any problems you or family members have had with anesthetic medicines.  Any blood disorders you have.  Any surgeries you have had.  Any medical conditions you have.  Whether you are pregnant or may be pregnant. What are the risks? Generally, this is a safe procedure. However, problems may occur, including:  Harm to a pregnant woman and her unborn baby. This test involves the use of radiation. Radiation exposure can be dangerous to a pregnant woman and her unborn baby. If you are pregnant, you generally should not have this procedure done.  Slight increase in the risk of cancer. This is because of the radiation involved in the test. What happens before the procedure? No preparation is needed for this procedure. What happens during the procedure?   You will undress and remove any jewelry around your neck or chest.  You will put on a hospital gown.  Sticky electrodes will be placed on your chest. The electrodes will be connected to an electrocardiogram (ECG) machine to record a tracing of the electrical activity of your heart.  A CT scanner will take pictures of your heart. During this time, you will be asked to lie still and hold your breath for 2-3 seconds while a picture of your heart is being  taken. The procedure may vary among health care providers and hospitals. What happens after the procedure?  You can get dressed.  You can return to your normal activities.  It is up to you to get the results of your test. Ask your health care provider, or the department that is doing the test, when your results will be ready. Summary  A coronary calcium scan is  an imaging test used to look for deposits of calcium and other fatty materials (plaques) in the inner lining of the blood vessels of the heart (coronary arteries).  Generally, this is a safe procedure. Tell your health care provider if you are pregnant or may be pregnant.  No preparation is needed for this procedure.  A CT scanner will take pictures of your heart.  You can return to your normal activities after the scan is done. This information is not intended to replace advice given to you by your health care provider. Make sure you discuss any questions you have with your health care provider. Document Released: 08/21/2007 Document Revised: 02/04/2017 Document Reviewed: 01/12/2016 Elsevier Patient Education  2020 [employer].

## 2019-02-06 DIAGNOSIS — M0609 Rheumatoid arthritis without rheumatoid factor, multiple sites: Secondary | ICD-10-CM | POA: Diagnosis not present

## 2019-02-09 DIAGNOSIS — Z20828 Contact with and (suspected) exposure to other viral communicable diseases: Secondary | ICD-10-CM | POA: Diagnosis not present

## 2019-02-09 DIAGNOSIS — J029 Acute pharyngitis, unspecified: Secondary | ICD-10-CM | POA: Diagnosis not present

## 2019-02-23 ENCOUNTER — Other Ambulatory Visit: Payer: Self-pay | Admitting: Cardiology

## 2019-03-08 DIAGNOSIS — F329 Major depressive disorder, single episode, unspecified: Secondary | ICD-10-CM | POA: Diagnosis not present

## 2019-03-08 DIAGNOSIS — K219 Gastro-esophageal reflux disease without esophagitis: Secondary | ICD-10-CM | POA: Diagnosis not present

## 2019-03-20 ENCOUNTER — Other Ambulatory Visit: Payer: Self-pay

## 2019-03-20 ENCOUNTER — Ambulatory Visit (INDEPENDENT_AMBULATORY_CARE_PROVIDER_SITE_OTHER)
Admission: RE | Admit: 2019-03-20 | Discharge: 2019-03-20 | Disposition: A | Discharge status: Home / Self Care | Payer: Self-pay | Source: Ambulatory Visit | Attending: Cardiology | Admitting: Cardiology

## 2019-03-20 DIAGNOSIS — R0600 Dyspnea, unspecified: Secondary | ICD-10-CM

## 2019-03-22 ENCOUNTER — Telehealth: Payer: Self-pay

## 2019-03-22 NOTE — Telephone Encounter (Signed)
DPR on record, attempted to reach patient.  Noa nswer, left detailed message advising results avail on my chart, MD recommends no changes.  Callback to office if any questions.

## 2019-03-22 NOTE — Telephone Encounter (Signed)
-----   Message from Park Liter, MD sent at 03/20/2019  7:24 PM EST ----- 4.3 cm ascending arctic aneurysm.  Medical therapy for now Calcium score 234 which is elevated.  We will discuss about this during the visit

## 2019-04-03 DIAGNOSIS — M0609 Rheumatoid arthritis without rheumatoid factor, multiple sites: Secondary | ICD-10-CM | POA: Diagnosis not present

## 2019-04-07 DIAGNOSIS — M1990 Unspecified osteoarthritis, unspecified site: Secondary | ICD-10-CM | POA: Diagnosis not present

## 2019-04-07 DIAGNOSIS — K219 Gastro-esophageal reflux disease without esophagitis: Secondary | ICD-10-CM | POA: Diagnosis not present

## 2019-04-07 DIAGNOSIS — I10 Essential (primary) hypertension: Secondary | ICD-10-CM | POA: Diagnosis not present

## 2019-04-19 ENCOUNTER — Telehealth: Payer: Self-pay | Admitting: Cardiology

## 2019-04-19 NOTE — Telephone Encounter (Signed)
Attempt to call patient, line remains busy

## 2019-04-19 NOTE — Telephone Encounter (Signed)
Patient calling in regards to recent results on a test he had in [place].

## 2019-04-20 NOTE — Telephone Encounter (Signed)
Spoke with patient, results reviewed for CT scan done in January. Patient made follow up appointment with Dr. Agustin Cree for March. Patient verbalized understanding.

## 2019-04-20 NOTE — Telephone Encounter (Signed)
Patient is returning phone call.  °

## 2019-05-01 DIAGNOSIS — M0609 Rheumatoid arthritis without rheumatoid factor, multiple sites: Secondary | ICD-10-CM | POA: Diagnosis not present

## 2019-05-01 DIAGNOSIS — Z79899 Other long term (current) drug therapy: Secondary | ICD-10-CM | POA: Diagnosis not present

## 2019-05-14 DIAGNOSIS — M150 Primary generalized (osteo)arthritis: Secondary | ICD-10-CM | POA: Diagnosis not present

## 2019-05-14 DIAGNOSIS — M545 Low back pain: Secondary | ICD-10-CM | POA: Diagnosis not present

## 2019-05-14 DIAGNOSIS — Z6834 Body mass index (BMI) 34.0-34.9, adult: Secondary | ICD-10-CM | POA: Diagnosis not present

## 2019-05-14 DIAGNOSIS — M2550 Pain in unspecified joint: Secondary | ICD-10-CM | POA: Diagnosis not present

## 2019-05-14 DIAGNOSIS — Z79899 Other long term (current) drug therapy: Secondary | ICD-10-CM | POA: Diagnosis not present

## 2019-05-14 DIAGNOSIS — M0609 Rheumatoid arthritis without rheumatoid factor, multiple sites: Secondary | ICD-10-CM | POA: Diagnosis not present

## 2019-05-14 DIAGNOSIS — R21 Rash and other nonspecific skin eruption: Secondary | ICD-10-CM | POA: Diagnosis not present

## 2019-05-14 DIAGNOSIS — E669 Obesity, unspecified: Secondary | ICD-10-CM | POA: Diagnosis not present

## 2019-05-15 ENCOUNTER — Other Ambulatory Visit: Payer: Self-pay

## 2019-05-15 ENCOUNTER — Encounter: Payer: Self-pay | Admitting: Cardiology

## 2019-05-15 ENCOUNTER — Ambulatory Visit (INDEPENDENT_AMBULATORY_CARE_PROVIDER_SITE_OTHER): Payer: Medicare Other | Admitting: Cardiology

## 2019-05-15 VITALS — BP 138/86 | HR 52 | Temp 96.4°F | Ht 74.0 in | Wt 273.0 lb

## 2019-05-15 DIAGNOSIS — I7121 Aneurysm of the ascending aorta, without rupture: Secondary | ICD-10-CM

## 2019-05-15 DIAGNOSIS — I712 Thoracic aortic aneurysm, without rupture: Secondary | ICD-10-CM | POA: Diagnosis not present

## 2019-05-15 DIAGNOSIS — R0600 Dyspnea, unspecified: Secondary | ICD-10-CM

## 2019-05-15 DIAGNOSIS — M069 Rheumatoid arthritis, unspecified: Secondary | ICD-10-CM | POA: Diagnosis not present

## 2019-05-15 DIAGNOSIS — I483 Typical atrial flutter: Secondary | ICD-10-CM

## 2019-05-15 DIAGNOSIS — I10 Essential (primary) hypertension: Secondary | ICD-10-CM

## 2019-05-15 DIAGNOSIS — E785 Hyperlipidemia, unspecified: Secondary | ICD-10-CM

## 2019-05-15 DIAGNOSIS — R0609 Other forms of dyspnea: Secondary | ICD-10-CM

## 2019-05-15 MED ORDER — AMLODIPINE BESYLATE 5 MG PO TABS: 5.0000 mg | ORAL_TABLET | Freq: Every day | ORAL | 1 refills | Status: DC

## 2019-05-15 NOTE — Patient Instructions (Signed)
Medication Instructions:  Your physician recommends that you continue on your current medications as directed. Please refer to the Current Medication list given to you today.  *If you need a refill on your cardiac medications before your next appointment, please call your pharmacy*   Lab Work: None.  If you have labs (blood work) drawn today and your tests are completely normal, you will receive your results only by: Marland Kitchen MyChart Message (if you have MyChart) OR . A paper copy in the mail If you have any lab test that is abnormal or we need to change your treatment, we will call you to review the results.   Testing/Procedures: None.   Follow-Up: At [facility], you and your health needs are our priority.  As part of our continuing mission to provide you with exceptional heart care, we have created designated Provider Care Teams.  These Care Teams include your primary Cardiologist (physician) and Advanced Practice Providers (APPs -  Physician Assistants and Nurse Practitioners) who all work together to provide you with the care you need, when you need it.  We recommend signing up for the patient portal called "MyChart".  Sign up information is provided on this After Visit Summary.  MyChart is used to connect with patients for Virtual Visits (Telemedicine).  Patients are able to view lab/test results, encounter notes, upcoming appointments, etc.  Non-urgent messages can be sent to your provider as well.   To learn more about what you can do with MyChart, go to [email or web address].    Your next appointment:   5 month(s)  The format for your next appointment:   In Person  Provider:   Jenne Campus, MD   Other Instructions  Dr. Agustin Cree has referred you to see lipid clinic they should call you within 1 week. If no please call our office.

## 2019-05-15 NOTE — Progress Notes (Signed)
Cardiology Office Note:    Date:  05/15/2019   ID:  Brett Rosales, DOB 08-03-1945, MRN [id number]  PCP:  Ronita Hipps, MD  Cardiologist:  Jenne Campus, MD    Referring MD: Ronita Hipps, MD   No chief complaint on file. And doing well  History of Present Illness:    Brett Rosales is a 74 y.o. male with past medical history significant for paroxysmal typical atrial flutter, anticoagulated, maintaining sinus rhythm with only beta-blocker, ascending aortic aneurysm with last assessment by CT on January 2021 showing 4.3 cm, stable, dyslipidemia with intolerance to statin, able to take only Zetia, discussion about PCSK9 agent continue.  Recent calcium score has been done which was 234. He comes today 2 months for follow-up.  Overall seems to be doing well.  Denies have any chest pain tightness squeezing pressure burning chest.  He did have hip surgery and he is very happy with the results.  Now he complains about me is thinking about potentially doing surgery for his knees.  Denies having any cardiac complaint, no palpitations no dizziness or passing out  Past Medical History:  Diagnosis Date  . Anxiety    medication made him feel flat stopped meds 09/07/2018  . Arthritis   . Depression   . Dysrhythmia 11/2017  . History of kidney stones    13 stones  . Hypertension   . Sleep apnea 03/2018   Had study after episode of disrhythmia. it was inconclusive. needs follow up    Past Surgical History:  Procedure Laterality Date  . TONSILLECTOMY AND ADENOIDECTOMY    . 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 ezetimibe (ZETIA) 10 MG tablet Take 10 mg by mouth daily.  . folic acid (FOLVITE) 1 MG tablet Take 1 mg by mouth daily.  Marland Kitchen losartan (COZAAR) 100 MG tablet Take 100  mg by mouth daily.  . methylphenidate (RITALIN) 20 MG tablet Take 20 mg by mouth 2 (two) times a day.   . metoprolol tartrate (LOPRESSOR) 25 MG tablet TAKE 1 TABLET BY MOUTH TWICE DAILY.  . rivaroxaban (XARELTO) 20 MG TABS tablet Take 1 tablet (20 mg total) by mouth daily with supper.  . traMADol (ULTRAM) 50 MG tablet Take 50 mg by mouth every 6 (six) hours as needed.     Allergies:   Nsaids and Statins   Social History   Socioeconomic History  . Marital status: Married    Spouse name: Not on file  . Number of children: Not on file  . Years of education: Not on file  . Highest education level: Not on file  Occupational History  . Not on file  Tobacco Use  . Smoking status: Never Smoker  . Smokeless tobacco: Never Used  Substance and Sexual Activity  . Alcohol use: No    Alcohol/week: 0.0 standard drinks  . Drug use: No  . Sexual activity: Not on file  Other Topics Concern  . Not on file  Social History Narrative  . Not on file   Social Determinants of Health   Financial Resource Strain:   . Difficulty of Paying Living Expenses: Not on file  Food Insecurity:   . Worried About Charity fundraiser in the Last Year: Not on file  .  Ran Out of Food in the Last Year: Not on file  Transportation Needs:   . Lack of Transportation (Medical): Not on file  . Lack of Transportation (Non-Medical): Not on file  Physical Activity:   . Days of Exercise per Week: Not on file  . Minutes of Exercise per Session: Not on file  Stress:   . Feeling of Stress : Not on file  Social Connections:   . Frequency of Communication with Friends and Family: Not on file  . Frequency of Social Gatherings with Friends and Family: Not on file  . Attends Religious Services: Not on file  . Active Member of Clubs or Organizations: Not on file  . Attends Archivist Meetings: Not on file  . Marital Status: Not on file     Family History: The patient's family history includes Cancer in his  mother; Diabetes in his mother; Hypertension in his father. ROS:   Please see the history of present illness.    All 14 point review of systems negative except as described per history of present illness  EKGs/Labs/Other Studies Reviewed:      Recent Labs: 09/20/2018: ALT 28 09/22/2018: BUN 22; Creatinine, Ser 1.04; Hemoglobin 11.5; Platelets 208; Potassium 4.6; Sodium 137  Recent Lipid Panel No results found for: CHOL, TRIG, HDL, CHOLHDL, VLDL, LDLCALC, LDLDIRECT  Physical Exam:    VS:  BP 138/86   Pulse (!) 52   Temp (!) 96.4 F (35.8 C)   Ht 6\' 2"  (1.88 m)   Wt 273 lb (123.8 kg)   BMI 35.05 kg/m     Wt Readings from Last 3 Encounters:  05/15/19 273 lb (123.8 kg)  01/23/19 278 lb 6.4 oz (126.3 kg)  09/21/18 271 lb 1.6 oz (123 kg)     GEN:  Well nourished, well developed in no acute distress HEENT: Normal NECK: No JVD; No carotid bruits LYMPHATICS: No lymphadenopathy CARDIAC: RRR, no murmurs, no rubs, no gallops RESPIRATORY:  Clear to auscultation without rales, wheezing or rhonchi  ABDOMEN: Soft, non-tender, non-distended MUSCULOSKELETAL:  No edema; No deformity  SKIN: Warm and dry LOWER EXTREMITIES: no swelling NEUROLOGIC:  Alert and oriented x 3 PSYCHIATRIC:  Normal affect   ASSESSMENT:    1. Typical atrial flutter ([place])   2. Ascending aortic aneurysm ([place])   3. Essential hypertension   4. Rheumatoid arthritis involving shoulder, unspecified laterality, unspecified whether rheumatoid factor present ([place])   5. Dyspnea on exertion   6. Dyslipidemia    PLAN:    In order of problems listed above:  1. Paroxysmal typical atrial flutter, maintaining sinus rhythm today EKG shows sinus bradycardia rate of 52.  Nonspecific ST segment changes.  We will continue present medication which includes beta-blocker. 2. Ascending aneurysm was measuring 43 mm.  I will increase her antihypertensive therapy.  I will add Norvasc/amlodipine 5 mg p.o. to his medical regimen.  I  would like to see his blood.  Level of 120/70.  We talked about avoidance of isovolumetric exercises.  He will need periodic CT of his chest to look and check her aneurysm. 3. Rheumatoid arthritis followed by internal medicine team 4. Dyspnea exertion denies having any 5. Dyslipidemia, I calculated his Mesa score which gave him 10 years risk of 16.1%.  I think that is significant enough to consider PCSK9 agent, however, he will be referred to lipid clinic to discuss this further.   Medication Adjustments/Labs and Tests Ordered: Current medicines are reviewed at length with the patient today.  Concerns regarding medicines are outlined above.  No orders of the defined types were placed in this encounter.  Medication changes: No orders of the defined types were placed in this encounter.   Signed, Park Liter, MD, [facility] 05/15/2019 11:08 AM    [place]

## 2019-05-17 DIAGNOSIS — L300 Nummular dermatitis: Secondary | ICD-10-CM | POA: Diagnosis not present

## 2019-06-05 ENCOUNTER — Other Ambulatory Visit: Payer: Self-pay | Admitting: Cardiology

## 2019-06-05 DIAGNOSIS — M0609 Rheumatoid arthritis without rheumatoid factor, multiple sites: Secondary | ICD-10-CM | POA: Diagnosis not present

## 2019-06-12 ENCOUNTER — Telehealth: Payer: Self-pay | Admitting: Cardiology

## 2019-06-12 MED ORDER — RIVAROXABAN 20 MG PO TABS: 20.0000 mg | ORAL_TABLET | Freq: Every day | ORAL | 1 refills | Status: DC

## 2019-06-12 NOTE — Telephone Encounter (Signed)
*  STAT* If patient is at the pharmacy, call can be transferred to refill team.   1. Which medications need to be refilled? (please list name of each medication and dose if known)  rivaroxaban (XARELTO) 20 MG TABS tablet  2. Which pharmacy/location (including street and city if local pharmacy) is medication to be sent to? [place], [place]  3. Do they need a 30 day or 90 day supply? 30 day supply   Patient is currently out of medication.

## 2019-06-18 ENCOUNTER — Other Ambulatory Visit: Payer: Self-pay | Admitting: Cardiology

## 2019-06-19 DIAGNOSIS — M0609 Rheumatoid arthritis without rheumatoid factor, multiple sites: Secondary | ICD-10-CM | POA: Diagnosis not present

## 2019-07-06 DIAGNOSIS — K219 Gastro-esophageal reflux disease without esophagitis: Secondary | ICD-10-CM | POA: Diagnosis not present

## 2019-07-06 DIAGNOSIS — I10 Essential (primary) hypertension: Secondary | ICD-10-CM | POA: Diagnosis not present

## 2019-07-17 DIAGNOSIS — M0609 Rheumatoid arthritis without rheumatoid factor, multiple sites: Secondary | ICD-10-CM | POA: Diagnosis not present

## 2019-07-19 DIAGNOSIS — Z6835 Body mass index (BMI) 35.0-35.9, adult: Secondary | ICD-10-CM | POA: Diagnosis not present

## 2019-07-19 DIAGNOSIS — M2550 Pain in unspecified joint: Secondary | ICD-10-CM | POA: Diagnosis not present

## 2019-07-19 DIAGNOSIS — M150 Primary generalized (osteo)arthritis: Secondary | ICD-10-CM | POA: Diagnosis not present

## 2019-07-19 DIAGNOSIS — M0609 Rheumatoid arthritis without rheumatoid factor, multiple sites: Secondary | ICD-10-CM | POA: Diagnosis not present

## 2019-07-19 DIAGNOSIS — Z79899 Other long term (current) drug therapy: Secondary | ICD-10-CM | POA: Diagnosis not present

## 2019-07-19 DIAGNOSIS — M545 Low back pain: Secondary | ICD-10-CM | POA: Diagnosis not present

## 2019-07-19 DIAGNOSIS — R21 Rash and other nonspecific skin eruption: Secondary | ICD-10-CM | POA: Diagnosis not present

## 2019-07-19 DIAGNOSIS — E669 Obesity, unspecified: Secondary | ICD-10-CM | POA: Diagnosis not present

## 2019-07-23 DIAGNOSIS — Z1331 Encounter for screening for depression: Secondary | ICD-10-CM | POA: Diagnosis not present

## 2019-07-23 DIAGNOSIS — Z6837 Body mass index (BMI) 37.0-37.9, adult: Secondary | ICD-10-CM | POA: Diagnosis not present

## 2019-07-23 DIAGNOSIS — E785 Hyperlipidemia, unspecified: Secondary | ICD-10-CM | POA: Diagnosis not present

## 2019-07-23 DIAGNOSIS — Z0000 Encounter for general adult medical examination without abnormal findings: Secondary | ICD-10-CM | POA: Diagnosis not present

## 2019-08-14 DIAGNOSIS — M21161 Varus deformity, not elsewhere classified, right knee: Secondary | ICD-10-CM | POA: Diagnosis not present

## 2019-08-14 DIAGNOSIS — M170 Bilateral primary osteoarthritis of knee: Secondary | ICD-10-CM | POA: Diagnosis not present

## 2019-08-14 DIAGNOSIS — M21162 Varus deformity, not elsewhere classified, left knee: Secondary | ICD-10-CM | POA: Diagnosis not present

## 2019-08-14 DIAGNOSIS — G8929 Other chronic pain: Secondary | ICD-10-CM | POA: Insufficient documentation

## 2019-08-14 DIAGNOSIS — M1711 Unilateral primary osteoarthritis, right knee: Secondary | ICD-10-CM | POA: Diagnosis not present

## 2019-08-14 DIAGNOSIS — M1712 Unilateral primary osteoarthritis, left knee: Secondary | ICD-10-CM | POA: Diagnosis not present

## 2019-08-14 DIAGNOSIS — M25562 Pain in left knee: Secondary | ICD-10-CM | POA: Diagnosis not present

## 2019-08-14 DIAGNOSIS — M25561 Pain in right knee: Secondary | ICD-10-CM | POA: Diagnosis not present

## 2019-08-14 HISTORY — DX: Other chronic pain: G89.29

## 2019-08-15 ENCOUNTER — Telehealth: Payer: Self-pay | Admitting: Emergency Medicine

## 2019-08-15 NOTE — Telephone Encounter (Signed)
   [facility] Pre-operative Risk Assessment    HEARTCARE STAFF: - Please ensure there is not already an duplicate clearance open for this procedure. - Under Visit Info/Reason for Call, type in Other and utilize the format Clearance MM/DD/YY or Clearance TBD. Do not use dashes or single digits. - If request is for dental extraction, please clarify the # of teeth to be extracted.  Request for surgical clearance:  1. What type of surgery is being performed? TKA   2. When is this surgery scheduled? To be determined    3. What type of clearance is required (medical clearance vs. Pharmacy clearance to hold med vs. Both)? Medical   4. Are there any medications that need to be held prior to surgery and how long? None specified    5. Practice name and name of physician performing surgery? [facility]: Lara Mulch, MD, Kathlyn [facility] Pa-C, Zac currence PA-C   6. What is the office phone number? [phone]   7.   What is the office fax number? [phone]  8.   Anesthesia type (None, local, MAC, general) ? None specified    Brett Rosales 08/15/2019, 4:12 PM  _________________________________________________________________   (provider comments below)

## 2019-08-16 NOTE — Telephone Encounter (Signed)
   Primary Cardiologist: Jenne Campus, MD  Chart reviewed as part of pre-operative protocol coverage. Given past medical history and time since last visit, based on ACC/AHA guidelines, Brett Rosales would be at acceptable risk for the planned procedure without further cardiovascular testing.   He has a RCRI class I risk, 0.4% risk of major cardiac event.  I will route this recommendation to the requesting party via Epic fax function and remove from pre-op pool.  Please call with questions.  [name]. Adoni Greenough NP-C    08/16/2019, 9:20 AM [address] Office [phone] Fax [phone]

## 2019-08-23 ENCOUNTER — Encounter: Payer: Self-pay | Admitting: Cardiology

## 2019-08-23 NOTE — Telephone Encounter (Signed)
Disregard opened in error °

## 2019-09-14 ENCOUNTER — Other Ambulatory Visit: Payer: Self-pay | Admitting: Orthopedic Surgery

## 2019-09-17 NOTE — Patient Instructions (Addendum)
DUE TO COVID-19 ONLY ONE VISITOR ARE ALLOWED TO COME WITH YOU AND STAY IN THE WAITING ROOM ONLY DURING PRE OP AND  PROCEDURE. THEN TWO VISITORS MAY VISIT WITH YOU IN YOUR PRIVATE ROOM DURING VISITING HOURS ONLY!!   COVID SWAB TESTING MUST BE COMPLETED ON:   09-20-19 @ 12:40 PM [address], [place] -[facility] enter pre surgical testing line (Must self quarantine after testing. Follow instructions on handout.)         Your procedure is scheduled on:  09-24-19   Report to [facility] Main  Entrance   Report to Short Stay at 5:30 AM   [facility])    Call this number if you have problems the morning of surgery [phone]   Do not eat food :After Midnight.   May have liquids until  4:15 am  day of surgery   Complete one Ensure drink the morning of surgery at  4:15 am the day of surgery.  CLEAR LIQUID DIET  Foods Allowed                                                                     Foods Excluded  Water, Black Coffee and tea, regular and decaf             liquids that you cannot  Plain Jell-O in any flavor  (No red)                                    see through such as: Fruit ices (not with fruit pulp)                                      milk, soups, orange juice  Iced Popsicles (No red)                                     All solid food                                   Apple juices Sports drinks like Gatorade (No red) Lightly seasoned clear broth or consume(fat free) Sugar, honey syrup     Take these medicines the morning of surgery with A SIP OF WATER:  Amlodipine (Norvasc), Donepezil (Aricept), Ezetimibe (Zetia), and Metoprolol (Lopressor)     Oral Hygiene is also important to reduce your risk of infection.                                      Remember - BRUSH YOUR TEETH THE MORNING OF SURGERY WITH YOUR REGULAR TOOTHPASTE.  AFTERWARDS NO WATER, GUM, CANDY OR MINTS                           You may not have any metal on your body  including, jewelry,  and body piercings               Do not wear make-up, lotions, powders, cologne, or deodorant                       Men may shave face and neck.   Do not bring valuables to the hospital. [place].   Contacts, dentures or bridgework may not be worn into surgery.   Patients discharged the day of surgery will not be allowed to drive home.   Please read over the following fact sheets you were given: IF YOU HAVE QUESTIONS ABOUT YOUR PRE OP INSTRUCTIONS PLEASE CALL  [phone]    [facility] - Preparing for Surgery  Before surgery, you can play an important role.  Because skin is not sterile, your skin needs to be as free of germs as possible.  You can reduce the number of germs on your skin by washing with CHG (chlorahexidine gluconate) soap before surgery.  CHG is an antiseptic cleaner which kills germs and bonds with the skin to continue killing germs even after washing. Please DO NOT use if you have an allergy to CHG or antibacterial soaps.  If your skin becomes reddened/irritated stop using the CHG and inform your nurse when you arrive at Short Stay. Do not shave (including legs and underarms) for at least 48 hours prior to the first CHG shower.  You may shave your face/neck.  Please follow these instructions carefully:  1.  Shower with CHG Soap the night before surgery and the  morning of surgery.  2.  If you choose to wash your hair, wash your hair first as usual with your normal  shampoo.  3.  After you shampoo, rinse your hair and body thoroughly to remove the shampoo.                             4.  Use CHG as you would any other liquid soap.  You can apply chg directly to the skin and wash.  Gently with a scrungie or clean washcloth.  5.  Apply the CHG Soap to your body ONLY FROM THE NECK DOWN.   Do   not use on face/ open                           Wound or open sores. Avoid contact with eyes, ears mouth and   genitals  (private parts).                       Wash face,  Genitals (private parts) with your normal soap.             6.  Wash thoroughly, paying special attention to the area where your    surgery  will be performed.  7.  Thoroughly rinse your body with warm water from the neck down.  8.  DO NOT shower/wash with your normal soap after using and rinsing off the CHG Soap.                9.  Pat yourself dry with a clean towel.            10.  Wear clean pajamas.            11.  Place clean sheets on your bed the  night of your first shower and do not  sleep with pets. Day of Surgery : Do not apply any lotions/deodorants the morning of surgery.  Please wear clean clothes to the hospital/surgery center.  FAILURE TO FOLLOW THESE INSTRUCTIONS MAY RESULT IN THE CANCELLATION OF YOUR SURGERY  PATIENT SIGNATURE_________________________________  NURSE SIGNATURE__________________________________  ________________________________________________________________________   Brett Rosales  An incentive spirometer is a tool that can help keep your lungs clear and active. This tool measures how well you are filling your lungs with each breath. Taking long deep breaths may help reverse or decrease the chance of developing breathing (pulmonary) problems (especially infection) following:  A long period of time when you are unable to move or be active. BEFORE THE PROCEDURE   If the spirometer includes an indicator to show your best effort, your nurse or respiratory therapist will set it to a desired goal.  If possible, sit up straight or lean slightly forward. Try not to slouch.  Hold the incentive spirometer in an upright position. INSTRUCTIONS FOR USE  1. Sit on the edge of your bed if possible, or sit up as far as you can in bed or on a chair. 2. Hold the incentive spirometer in an upright position. 3. Breathe out normally. 4. Place the mouthpiece in your mouth and seal your lips tightly around  it. 5. Breathe in slowly and as deeply as possible, raising the piston or the ball toward the top of the column. 6. Hold your breath for 3-5 seconds or for as long as possible. Allow the piston or ball to fall to the bottom of the column. 7. Remove the mouthpiece from your mouth and breathe out normally. 8. Rest for a few seconds and repeat Steps 1 through 7 at least 10 times every 1-2 hours when you are awake. Take your time and take a few normal breaths between deep breaths. 9. The spirometer may include an indicator to show your best effort. Use the indicator as a goal to work toward during each repetition. 10. After each set of 10 deep breaths, practice coughing to be sure your lungs are clear. If you have an incision (the cut made at the time of surgery), support your incision when coughing by placing a pillow or rolled up towels firmly against it. Once you are able to get out of bed, walk around indoors and cough well. You may stop using the incentive spirometer when instructed by your caregiver.  RISKS AND COMPLICATIONS  Take your time so you do not get dizzy or light-headed.  If you are in pain, you may need to take or ask for pain medication before doing incentive spirometry. It is harder to take a deep breath if you are having pain. AFTER USE  Rest and breathe slowly and easily.  It can be helpful to keep track of a log of your progress. Your caregiver can provide you with a simple table to help with this. If you are using the spirometer at home, follow these instructions: [place] IF:   You are having difficultly using the spirometer.  You have trouble using the spirometer as often as instructed.  Your pain medication is not giving enough relief while using the spirometer.  You develop fever of 100.5 F (38.1 C) or higher. SEEK IMMEDIATE MEDICAL CARE IF:   You cough up bloody sputum that had not been present before.  You develop fever of 102 F (38.9 C) or  greater.  You develop worsening pain at  or near the incision site. MAKE SURE YOU:   Understand these instructions.  Will watch your condition.  Will get help right away if you are not doing well or get worse. Document Released: 07/05/2006 Document Revised: 05/17/2011 Document Reviewed: 09/05/2006 [facility] Patient Information 2014 [place], [place].   ________________________________________________________________________

## 2019-09-17 NOTE — Progress Notes (Addendum)
COVID Vaccine Completed: x2   Date COVID Vaccine completed: COVID vaccine manufacturer: Pfizer    [employer]   PCP - Dr. Kennith Maes Cardiologist - Dr. Jenne Campus. Last OV 05-15-19  Cardiac clearance in Epic dated 08-15-19 by Coletta Memos, NP-C  No Back Stimulator  Chest x-ray -  EKG - 05-15-19 Stress Test -  ECHO - 12-19-17 In Epic Cardiac Cath -   Sleep Study - CPAP -   Fasting Blood Sugar -  Checks Blood Sugar _____ times a day  Blood Thinner Instructions: Xarelto 20 mg.  Stop 2 days with last dose being 09/22/19 per patient Aspirin Instructions: Last Dose:    Anesthesia review:   Patient denies shortness of breath, fever, cough and chest pain at PAT appointment  No SOB with ADL's  Patient verbalized understanding of instructions that were given to them at the PAT appointment. Patient was also instructed that they will need to review over the PAT instructions again at home before surgery.

## 2019-09-20 ENCOUNTER — Other Ambulatory Visit (HOSPITAL_COMMUNITY)
Admission: RE | Admit: 2019-09-20 | Discharge: 2019-09-20 | Disposition: A | Discharge status: Home / Self Care | Payer: Medicare Other | Source: Ambulatory Visit | Attending: Orthopedic Surgery | Admitting: Orthopedic Surgery

## 2019-09-20 ENCOUNTER — Encounter (HOSPITAL_COMMUNITY): Payer: Self-pay

## 2019-09-20 ENCOUNTER — Encounter (HOSPITAL_COMMUNITY)
Admission: RE | Admit: 2019-09-20 | Discharge: 2019-09-20 | Disposition: A | Discharge status: Home / Self Care | Payer: Medicare Other | Source: Ambulatory Visit | Attending: Orthopedic Surgery | Admitting: Orthopedic Surgery

## 2019-09-20 ENCOUNTER — Other Ambulatory Visit: Payer: Self-pay

## 2019-09-20 DIAGNOSIS — Z20822 Contact with and (suspected) exposure to covid-19: Secondary | ICD-10-CM | POA: Insufficient documentation

## 2019-09-20 DIAGNOSIS — Z01812 Encounter for preprocedural laboratory examination: Secondary | ICD-10-CM | POA: Insufficient documentation

## 2019-09-20 LAB — CBC WITH DIFFERENTIAL/PLATELET
Abs Immature Granulocytes: 0.02 10*3/uL (ref 0.00–0.07)
Basophils Absolute: 0 10*3/uL (ref 0.0–0.1)
Basophils Relative: 1 %
Eosinophils Absolute: 0.6 10*3/uL — ABNORMAL HIGH (ref 0.0–0.5)
Eosinophils Relative: 13 %
HCT: 42.2 % (ref 39.0–52.0)
Hemoglobin: 13.2 g/dL (ref 13.0–17.0)
Immature Granulocytes: 0 %
Lymphocytes Relative: 34 %
Lymphs Abs: 1.6 10*3/uL (ref 0.7–4.0)
MCH: 31.5 pg (ref 26.0–34.0)
MCHC: 31.3 g/dL (ref 30.0–36.0)
MCV: 100.7 fL — ABNORMAL HIGH (ref 80.0–100.0)
Monocytes Absolute: 0.6 10*3/uL (ref 0.1–1.0)
Monocytes Relative: 13 %
Neutro Abs: 1.8 10*3/uL (ref 1.7–7.7)
Neutrophils Relative %: 39 %
Platelets: 243 10*3/uL (ref 150–400)
RBC: 4.19 MIL/uL — ABNORMAL LOW (ref 4.22–5.81)
RDW: 14 % (ref 11.5–15.5)
WBC: 4.6 10*3/uL (ref 4.0–10.5)
nRBC: 0 % (ref 0.0–0.2)

## 2019-09-20 LAB — COMPREHENSIVE METABOLIC PANEL
ALT: 17 U/L (ref 0–44)
AST: 16 U/L (ref 15–41)
Albumin: 3.8 g/dL (ref 3.5–5.0)
Alkaline Phosphatase: 56 U/L (ref 38–126)
Anion gap: 7 (ref 5–15)
BUN: 17 mg/dL (ref 8–23)
CO2: 28 mmol/L (ref 22–32)
Calcium: 9.1 mg/dL (ref 8.9–10.3)
Chloride: 105 mmol/L (ref 98–111)
Creatinine, Ser: 1.01 mg/dL (ref 0.61–1.24)
GFR calc Af Amer: >60 mL/min (ref >60)
GFR calc non Af Amer: >60 mL/min (ref >60)
Glucose, Bld: 98 mg/dL (ref 70–99)
Potassium: 5.2 mmol/L — ABNORMAL HIGH (ref 3.5–5.1)
Sodium: 140 mmol/L (ref 135–145)
Total Bilirubin: 0.5 mg/dL (ref 0.3–1.2)
Total Protein: 6.6 g/dL (ref 6.5–8.1)

## 2019-09-20 LAB — SURGICAL PCR SCREEN
MRSA, PCR: NEGATIVE
Staphylococcus aureus: NEGATIVE

## 2019-09-20 LAB — SARS CORONAVIRUS 2 (TAT 6-24 HRS): SARS Coronavirus 2: NEGATIVE

## 2019-09-20 NOTE — Progress Notes (Signed)
Anesthesia Chart Review:   Case: [id number] Date/Time: 09/24/19 0700   Procedure: TOTAL KNEE ARTHROPLASTY (Left Knee)   Anesthesia type: Spinal   Pre-op diagnosis: Osteoarthritis left knee   [facility] ROOM 05 / [facility]   Surgeons: Vickey Huger, MD      DISCUSSION: Pt is a 74 year old with a hx of paroxysmal typical atrial flutter, ascending aortic aneurysm (4.3cm on CT 03/20/19), HTN, OSA   VS: BP (!) 153/82   Pulse (!) 56   Temp 37.1 C (Oral)   Resp 18   Ht 6\' 2"  (1.88 m)   Wt 126.6 kg   SpO2 100%   BMI 35.82 kg/m    PROVIDERS: - PCP is Ronita Hipps, MD - Cardiologist is Jenne Campus, MD. Last office visit 05/15/19. Pt cleared for surgery at acceptable risk by Coletta Memos, NP on 08/16/19   LABS: Labs reviewed: Acceptable for surgery. (all labs ordered are listed, but only abnormal results are displayed)  Labs Reviewed  CBC WITH DIFFERENTIAL/PLATELET - Abnormal; Notable for the following components:      Result Value   RBC 4.19 (*)    MCV 100.7 (*)    Eosinophils Absolute 0.6 (*)    All other components within normal limits  COMPREHENSIVE METABOLIC PANEL - Abnormal; Notable for the following components:   Potassium 5.2 (*)    All other components within normal limits  SURGICAL PCR SCREEN    EKG 05/15/19: sinus bradycardia, Nonspecific intraventricular conduction delay. Nonspecific ST abnormality.    CV: CT cardiac scoring 03/20/19:  - Coronary calcium score of 234. This was 57th percentile for age and sex matched control. - Mild ascending aortic aneurysm at 4.3cm. - Recommend aggressive risk factor modification.   Echo 12/19/17:  - Left ventricle: The cavity size was normal. Wall thickness was increased in a pattern of moderate LVH. Systolic function was normal. Wall motion was normal; there were no regional wall motion abnormalities. Left ventricular diastolic function parameters were normal for the patient's age.  - Aortic valve: There was mild  regurgitation.  - Left atrium: The atrium was mildly dilated.  - Right atrium: The atrium was mildly dilated.  - Pulmonary arteries: PA peak pressure: 38 mm Hg (S).   Past Medical History:  Diagnosis Date  . Anxiety    medication made him feel flat stopped meds 09/07/2018  . Arthritis   . Depression   . Dysrhythmia 11/2017  . History of kidney stones    13 stones  . Hypertension   . Sleep apnea 03/2018   Had study after episode of disrhythmia. it was inconclusive. needs follow up    Past Surgical History:  Procedure Laterality Date  . TONSILLECTOMY AND ADENOIDECTOMY    . 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 amLODipine (NORVASC) 5 MG tablet  . donepezil (ARICEPT) 5 MG tablet  . DULoxetine (CYMBALTA) 30 MG capsule  . ezetimibe (ZETIA) 10 MG tablet  . folic acid (FOLVITE) 1 MG tablet  . losartan (COZAAR) 100 MG tablet  . methotrexate 50 MG/2ML injection  . methylphenidate (RITALIN) 20 MG tablet  . metoprolol tartrate (LOPRESSOR) 25 MG tablet  . rivaroxaban (XARELTO) 20 MG TABS tablet  . traMADol (ULTRAM) 50 MG tablet   No current facility-administered medications for this encounter.    If  no changes, I anticipate pt can proceed with surgery as scheduled.   Willeen Cass, PhD, FNP-BC [facility] Short Stay Surgical Center/Anesthesiology Phone: [phone] 09/20/2019 3:01 PM

## 2019-09-20 NOTE — Anesthesia Preprocedure Evaluation (Addendum)
Anesthesia Evaluation  Patient identified by MRN, date of birth, ID band Patient awake    Reviewed: Allergy & Precautions, NPO status , Patient's Chart, lab work & pertinent test results, reviewed documented beta blocker date and time   Airway Mallampati: III  TM Distance: >3 FB Neck ROM: Full    Dental no notable dental hx.    Pulmonary sleep apnea , former smoker,    Pulmonary exam normal breath sounds clear to auscultation       Cardiovascular hypertension, Pt. on medications and Pt. on home beta blockers Normal cardiovascular exam+ dysrhythmias Atrial Fibrillation  Rhythm:Regular Rate:Normal  Ascending Aortic aneurysm  4.3 cm   Neuro/Psych PSYCHIATRIC DISORDERS Anxiety Depression negative neurological ROS     GI/Hepatic negative GI ROS, Neg liver ROS,   Endo/Other  Obesity Hyperlipidemia  Renal/GU Renal diseaseHx/o renal calculi  negative genitourinary   Musculoskeletal  (+) Arthritis , Osteoarthritis,  OA left knee   Abdominal (+) + obese,   Peds  Hematology Xarelto therapy- last dose   Anesthesia Other Findings   Reproductive/Obstetrics                           Anesthesia Physical Anesthesia Plan  ASA: III  Anesthesia Plan: Spinal   Post-op Pain Management:  Regional for Post-op pain   Induction:   PONV Risk Score and Plan: Propofol infusion, Ondansetron and Treatment may vary due to age or medical condition  Airway Management Planned: Natural Airway, Simple Face Mask and Nasal Cannula  Additional Equipment:   Intra-op Plan:   Post-operative Plan:   Informed Consent: I have reviewed the patients History and Physical, chart, labs and discussed the procedure including the risks, benefits and alternatives for the proposed anesthesia with the patient or authorized representative who has indicated his/her understanding and acceptance.     Dental advisory given  Plan  Discussed with: CRNA, Anesthesiologist and Surgeon  Anesthesia Plan Comments: (See APP note by Durel Salts, FNP)      Anesthesia Quick Evaluation

## 2019-09-22 ENCOUNTER — Encounter (HOSPITAL_COMMUNITY): Payer: Self-pay | Admitting: Orthopedic Surgery

## 2019-09-23 MED ORDER — BUPIVACAINE LIPOSOME 1.3 % IJ SUSP
20.0000 mL | Freq: Once | INTRAMUSCULAR | Status: DC
  Filled 2019-09-23: qty 20

## 2019-09-23 MED ORDER — DEXTROSE 5 % IV SOLN
3.0000 g | INTRAVENOUS | Status: AC
  Administered 2019-09-24: 3 g via INTRAVENOUS
  Filled 2019-09-23: qty 3

## 2019-09-24 ENCOUNTER — Encounter (HOSPITAL_COMMUNITY)
Admission: RE | Disposition: A | Discharge status: Home / Self Care | Payer: Self-pay | Source: Other Acute Inpatient Hospital | Attending: Orthopedic Surgery

## 2019-09-24 ENCOUNTER — Ambulatory Visit (HOSPITAL_COMMUNITY): Payer: Medicare Other | Admitting: Anesthesiology

## 2019-09-24 ENCOUNTER — Ambulatory Visit (HOSPITAL_COMMUNITY)
Admission: RE | Admit: 2019-09-24 | Discharge: 2019-09-24 | Disposition: A | Discharge status: Home / Self Care | Payer: Medicare Other | Source: Other Acute Inpatient Hospital | Attending: Orthopedic Surgery | Admitting: Orthopedic Surgery

## 2019-09-24 ENCOUNTER — Ambulatory Visit (HOSPITAL_COMMUNITY): Payer: Medicare Other | Admitting: Emergency Medicine

## 2019-09-24 ENCOUNTER — Encounter (HOSPITAL_COMMUNITY): Payer: Self-pay | Admitting: Orthopedic Surgery

## 2019-09-24 DIAGNOSIS — G8918 Other acute postprocedural pain: Secondary | ICD-10-CM | POA: Diagnosis not present

## 2019-09-24 DIAGNOSIS — M1712 Unilateral primary osteoarthritis, left knee: Secondary | ICD-10-CM | POA: Insufficient documentation

## 2019-09-24 DIAGNOSIS — E785 Hyperlipidemia, unspecified: Secondary | ICD-10-CM | POA: Diagnosis not present

## 2019-09-24 DIAGNOSIS — E669 Obesity, unspecified: Secondary | ICD-10-CM | POA: Insufficient documentation

## 2019-09-24 DIAGNOSIS — Z96642 Presence of left artificial hip joint: Secondary | ICD-10-CM | POA: Insufficient documentation

## 2019-09-24 DIAGNOSIS — I483 Typical atrial flutter: Secondary | ICD-10-CM | POA: Insufficient documentation

## 2019-09-24 DIAGNOSIS — Z79899 Other long term (current) drug therapy: Secondary | ICD-10-CM | POA: Diagnosis not present

## 2019-09-24 DIAGNOSIS — Z6835 Body mass index (BMI) 35.0-35.9, adult: Secondary | ICD-10-CM | POA: Diagnosis not present

## 2019-09-24 DIAGNOSIS — Z87891 Personal history of nicotine dependence: Secondary | ICD-10-CM | POA: Insufficient documentation

## 2019-09-24 DIAGNOSIS — I10 Essential (primary) hypertension: Secondary | ICD-10-CM | POA: Insufficient documentation

## 2019-09-24 DIAGNOSIS — Z7901 Long term (current) use of anticoagulants: Secondary | ICD-10-CM | POA: Diagnosis not present

## 2019-09-24 DIAGNOSIS — I712 Thoracic aortic aneurysm, without rupture: Secondary | ICD-10-CM | POA: Insufficient documentation

## 2019-09-24 DIAGNOSIS — G4730 Sleep apnea, unspecified: Secondary | ICD-10-CM | POA: Insufficient documentation

## 2019-09-24 DIAGNOSIS — M1990 Unspecified osteoarthritis, unspecified site: Secondary | ICD-10-CM | POA: Diagnosis not present

## 2019-09-24 HISTORY — PX: TOTAL KNEE ARTHROPLASTY: SHX125

## 2019-09-24 SURGERY — ARTHROPLASTY, KNEE, TOTAL
Anesthesia: General | Site: Knee | Laterality: Left

## 2019-09-24 MED ORDER — FENTANYL CITRATE (PF) 100 MCG/2ML IJ SOLN
25.0000 ug | INTRAMUSCULAR | Status: DC | PRN
  Administered 2019-09-24 (×2): 25 ug via INTRAVENOUS

## 2019-09-24 MED ORDER — ONDANSETRON HCL 4 MG/2ML IJ SOLN: 4.0000 mg | Freq: Once | INTRAMUSCULAR | Status: DC | PRN

## 2019-09-24 MED ORDER — MIDAZOLAM HCL 5 MG/5ML IJ SOLN
INTRAMUSCULAR | Status: DC | PRN
  Administered 2019-09-24: 2 mg via INTRAVENOUS

## 2019-09-24 MED ORDER — ACETAMINOPHEN 500 MG PO TABS: 1000.0000 mg | ORAL_TABLET | Freq: Four times a day (QID) | ORAL | Status: DC

## 2019-09-24 MED ORDER — ROCURONIUM BROMIDE 10 MG/ML (PF) SYRINGE
PREFILLED_SYRINGE | INTRAVENOUS | Status: AC
  Filled 2019-09-24: qty 10

## 2019-09-24 MED ORDER — BUPIVACAINE-EPINEPHRINE 0.25% -1:200000 IJ SOLN
INTRAMUSCULAR | Status: AC
  Filled 2019-09-24: qty 1

## 2019-09-24 MED ORDER — SUGAMMADEX SODIUM 200 MG/2ML IV SOLN
INTRAVENOUS | Status: DC | PRN
  Administered 2019-09-24: 200 mg via INTRAVENOUS

## 2019-09-24 MED ORDER — BUPIVACAINE LIPOSOME 1.3 % IJ SUSP
INTRAMUSCULAR | Status: DC | PRN
  Administered 2019-09-24: 20 mL

## 2019-09-24 MED ORDER — LACTATED RINGERS IV BOLUS
250.0000 mL | Freq: Once | INTRAVENOUS | Status: AC
  Administered 2019-09-24: 250 mL via INTRAVENOUS

## 2019-09-24 MED ORDER — ONDANSETRON HCL 4 MG/2ML IJ SOLN
INTRAMUSCULAR | Status: DC | PRN
  Administered 2019-09-24: 4 mg via INTRAVENOUS

## 2019-09-24 MED ORDER — OXYCODONE HCL 5 MG/5ML PO SOLN: 5.0000 mg | Freq: Once | ORAL | Status: DC | PRN

## 2019-09-24 MED ORDER — TRANEXAMIC ACID-NACL 1000-0.7 MG/100ML-% IV SOLN
1000.0000 mg | INTRAVENOUS | Status: AC
  Administered 2019-09-24: 1000 mg via INTRAVENOUS
  Filled 2019-09-24: qty 100

## 2019-09-24 MED ORDER — LIDOCAINE 2% (20 MG/ML) 5 ML SYRINGE
INTRAMUSCULAR | Status: AC
  Filled 2019-09-24: qty 5

## 2019-09-24 MED ORDER — CELECOXIB 200 MG PO CAPS: 400.0000 mg | ORAL_CAPSULE | Freq: Once | ORAL | Status: AC

## 2019-09-24 MED ORDER — FENTANYL CITRATE (PF) 100 MCG/2ML IJ SOLN
INTRAMUSCULAR | Status: AC
  Filled 2019-09-24: qty 2

## 2019-09-24 MED ORDER — ONDANSETRON HCL 4 MG/2ML IJ SOLN
INTRAMUSCULAR | Status: AC
  Filled 2019-09-24: qty 2

## 2019-09-24 MED ORDER — OXYCODONE HCL 5 MG PO TABS
ORAL_TABLET | ORAL | Status: AC
  Filled 2019-09-24: qty 1

## 2019-09-24 MED ORDER — CELECOXIB 200 MG PO CAPS
ORAL_CAPSULE | ORAL | Status: AC
  Administered 2019-09-24: 400 mg via ORAL
  Filled 2019-09-24: qty 2

## 2019-09-24 MED ORDER — METOCLOPRAMIDE HCL 5 MG/ML IJ SOLN: 5.0000 mg | Freq: Three times a day (TID) | INTRAMUSCULAR | Status: DC | PRN

## 2019-09-24 MED ORDER — ALBUMIN HUMAN 5 % IV SOLN
INTRAVENOUS | Status: AC
  Filled 2019-09-24: qty 250

## 2019-09-24 MED ORDER — CEFAZOLIN SODIUM-DEXTROSE 2-4 GM/100ML-% IV SOLN: 2.0000 g | Freq: Four times a day (QID) | INTRAVENOUS | Status: DC

## 2019-09-24 MED ORDER — GABAPENTIN 300 MG PO CAPS: 300.0000 mg | ORAL_CAPSULE | Freq: Three times a day (TID) | ORAL | 0 refills | Status: DC

## 2019-09-24 MED ORDER — PROPOFOL 10 MG/ML IV BOLUS
INTRAVENOUS | Status: DC | PRN
  Administered 2019-09-24: 200 mg via INTRAVENOUS

## 2019-09-24 MED ORDER — SODIUM CHLORIDE 0.9 % IR SOLN
Status: DC | PRN
  Administered 2019-09-24: 1000 mL

## 2019-09-24 MED ORDER — LACTATED RINGERS IV SOLN: INTRAVENOUS | Status: DC

## 2019-09-24 MED ORDER — LIDOCAINE 2% (20 MG/ML) 5 ML SYRINGE
INTRAMUSCULAR | Status: DC | PRN
  Administered 2019-09-24: 100 mg via INTRAVENOUS

## 2019-09-24 MED ORDER — DEXAMETHASONE SODIUM PHOSPHATE 10 MG/ML IJ SOLN
INTRAMUSCULAR | Status: AC
  Filled 2019-09-24: qty 1

## 2019-09-24 MED ORDER — BUPIVACAINE-EPINEPHRINE 0.25% -1:200000 IJ SOLN
INTRAMUSCULAR | Status: DC | PRN
  Administered 2019-09-24: 30 mL

## 2019-09-24 MED ORDER — ORAL CARE MOUTH RINSE: 15.0000 mL | Freq: Once | OROMUCOSAL | Status: AC

## 2019-09-24 MED ORDER — METHOCARBAMOL 500 MG PO TABS: 500.0000 mg | ORAL_TABLET | Freq: Four times a day (QID) | ORAL | 0 refills | Status: DC

## 2019-09-24 MED ORDER — OXYCODONE HCL 5 MG PO TABS: 5.0000 mg | ORAL_TABLET | Freq: Four times a day (QID) | ORAL | 0 refills | Status: DC | PRN

## 2019-09-24 MED ORDER — ACETAMINOPHEN 500 MG PO TABS: 1000.0000 mg | ORAL_TABLET | Freq: Once | ORAL | Status: DC

## 2019-09-24 MED ORDER — GABAPENTIN 300 MG PO CAPS
ORAL_CAPSULE | ORAL | Status: AC
  Administered 2019-09-24: 300 mg via ORAL
  Filled 2019-09-24: qty 1

## 2019-09-24 MED ORDER — SODIUM CHLORIDE (PF) 0.9 % IJ SOLN
INTRAMUSCULAR | Status: AC
  Filled 2019-09-24: qty 20

## 2019-09-24 MED ORDER — OXYCODONE HCL 5 MG PO TABS
5.0000 mg | ORAL_TABLET | ORAL | Status: DC | PRN
  Administered 2019-09-24: 5 mg via ORAL

## 2019-09-24 MED ORDER — MEPERIDINE HCL 50 MG/ML IJ SOLN: 6.2500 mg | INTRAMUSCULAR | Status: DC | PRN

## 2019-09-24 MED ORDER — OXYCODONE HCL 5 MG PO TABS: 5.0000 mg | ORAL_TABLET | Freq: Once | ORAL | Status: DC | PRN

## 2019-09-24 MED ORDER — SODIUM CHLORIDE 0.9% FLUSH
INTRAVENOUS | Status: DC | PRN
  Administered 2019-09-24: 20 mL

## 2019-09-24 MED ORDER — RIVAROXABAN 20 MG PO TABS: 20.0000 mg | ORAL_TABLET | Freq: Every day | ORAL | 1 refills | Status: DC

## 2019-09-24 MED ORDER — METOCLOPRAMIDE HCL 5 MG PO TABS
5.0000 mg | ORAL_TABLET | Freq: Three times a day (TID) | ORAL | Status: DC | PRN
  Filled 2019-09-24: qty 2

## 2019-09-24 MED ORDER — FENTANYL CITRATE (PF) 100 MCG/2ML IJ SOLN
INTRAMUSCULAR | Status: DC | PRN
  Administered 2019-09-24 (×4): 50 ug via INTRAVENOUS

## 2019-09-24 MED ORDER — POVIDONE-IODINE 10 % EX SWAB
2.0000 "application " | Freq: Once | CUTANEOUS | Status: AC
  Administered 2019-09-24: 2 via TOPICAL

## 2019-09-24 MED ORDER — ALBUMIN HUMAN 5 % IV SOLN
INTRAVENOUS | Status: DC | PRN
Start: 2019-09-24 — End: 2019-09-24

## 2019-09-24 MED ORDER — DEXAMETHASONE SODIUM PHOSPHATE 10 MG/ML IJ SOLN
8.0000 mg | Freq: Once | INTRAMUSCULAR | Status: AC
  Administered 2019-09-24: 8 mg via INTRAVENOUS

## 2019-09-24 MED ORDER — ROPIVACAINE HCL 7.5 MG/ML IJ SOLN
INTRAMUSCULAR | Status: DC | PRN
  Administered 2019-09-24: 20 mL via PERINEURAL

## 2019-09-24 MED ORDER — GABAPENTIN 300 MG PO CAPS: 300.0000 mg | ORAL_CAPSULE | Freq: Once | ORAL | Status: AC

## 2019-09-24 MED ORDER — MIDAZOLAM HCL 2 MG/2ML IJ SOLN
INTRAMUSCULAR | Status: AC
  Filled 2019-09-24: qty 2

## 2019-09-24 MED ORDER — PROPOFOL 10 MG/ML IV BOLUS
INTRAVENOUS | Status: AC
  Filled 2019-09-24: qty 20

## 2019-09-24 MED ORDER — ACETAMINOPHEN 500 MG PO TABS
ORAL_TABLET | ORAL | Status: AC
  Administered 2019-09-24: 500 mg
  Filled 2019-09-24: qty 2

## 2019-09-24 MED ORDER — WATER FOR IRRIGATION, STERILE IR SOLN
Status: DC | PRN
  Administered 2019-09-24: 2000 mL

## 2019-09-24 MED ORDER — ROCURONIUM BROMIDE 10 MG/ML (PF) SYRINGE
PREFILLED_SYRINGE | INTRAVENOUS | Status: DC | PRN
  Administered 2019-09-24: 60 mg via INTRAVENOUS

## 2019-09-24 MED ORDER — CHLORHEXIDINE GLUCONATE 0.12 % MT SOLN
15.0000 mL | Freq: Once | OROMUCOSAL | Status: AC
  Administered 2019-09-24: 15 mL via OROMUCOSAL

## 2019-09-24 MED ORDER — LACTATED RINGERS IV BOLUS
500.0000 mL | Freq: Once | INTRAVENOUS | Status: AC
  Administered 2019-09-24: 500 mL via INTRAVENOUS

## 2019-09-24 MED ORDER — FENTANYL CITRATE (PF) 100 MCG/2ML IJ SOLN
INTRAMUSCULAR | Status: AC
  Administered 2019-09-24: 25 ug via INTRAVENOUS
  Filled 2019-09-24: qty 2

## 2019-09-24 SURGICAL SUPPLY — 56 items
ARTISURF 10M PLYL 10-12GH KNEE (Knees) ×2 IMPLANT
BAG ZIPLOCK 12X15 (MISCELLANEOUS) ×2 IMPLANT
BLADE SAGITTAL 13X1.27X60 (BLADE) ×2 IMPLANT
BLADE SAW SGTL 83.5X18.5 (BLADE) ×2 IMPLANT
BLADE SURG 15 STRL LF DISP TIS (BLADE) ×1 IMPLANT
BLADE SURG 15 STRL SS (BLADE) ×1
BLADE SURG SZ10 CARB STEEL (BLADE) ×4 IMPLANT
BNDG ELASTIC 6X5.8 VLCR STR LF (GAUZE/BANDAGES/DRESSINGS) ×2 IMPLANT
BOWL SMART MIX CTS (DISPOSABLE) ×2 IMPLANT
CEMENT BONE SIMPLEX SPEEDSET (Cement) ×4 IMPLANT
COVER SURGICAL LIGHT HANDLE (MISCELLANEOUS) ×2 IMPLANT
COVER WAND RF STERILE (DRAPES) IMPLANT
CUFF TOURN SGL QUICK 34 (TOURNIQUET CUFF) ×1
CUFF TRNQT CYL 34X4.125X (TOURNIQUET CUFF) ×1 IMPLANT
DECANTER SPIKE VIAL GLASS SM (MISCELLANEOUS) ×4 IMPLANT
DRAPE INCISE IOBAN 66X45 STRL (DRAPES) ×4 IMPLANT
DRAPE U-SHAPE 47X51 STRL (DRAPES) ×2 IMPLANT
DRSG AQUACEL AG ADV 3.5X10 (GAUZE/BANDAGES/DRESSINGS) ×2 IMPLANT
DURAPREP 26ML APPLICATOR (WOUND CARE) ×4 IMPLANT
ELECT REM PT RETURN 15FT ADLT (MISCELLANEOUS) ×2 IMPLANT
FEMUR  CMT CCR STD SZ11 L KNEE (Knees) ×1 IMPLANT
FEMUR CMT CCR STD SZ11 L KNEE (Knees) ×1 IMPLANT
FEMUR CMTD CCR STD SZ11 L KNEE (Knees) ×1 IMPLANT
GLOVE BIOGEL M STRL SZ7.5 (GLOVE) ×2 IMPLANT
GLOVE BIOGEL PI IND STRL 7.5 (GLOVE) ×1 IMPLANT
GLOVE BIOGEL PI IND STRL 8.5 (GLOVE) ×2 IMPLANT
GLOVE BIOGEL PI INDICATOR 7.5 (GLOVE) ×1
GLOVE BIOGEL PI INDICATOR 8.5 (GLOVE) ×2
GLOVE SURG ORTHO 8.0 STRL STRW (GLOVE) ×6 IMPLANT
GOWN STRL REUS W/ TWL XL LVL3 (GOWN DISPOSABLE) ×2 IMPLANT
GOWN STRL REUS W/TWL XL LVL3 (GOWN DISPOSABLE) ×2
HANDPIECE INTERPULSE COAX TIP (DISPOSABLE) ×1
HOLDER FOLEY CATH W/STRAP (MISCELLANEOUS) ×2 IMPLANT
HOOD PEEL AWAY FLYTE STAYCOOL (MISCELLANEOUS) ×6 IMPLANT
KIT TURNOVER KIT A (KITS) ×2 IMPLANT
MANIFOLD NEPTUNE II (INSTRUMENTS) ×2 IMPLANT
NEEDLE HYPO 22GX1.5 SAFETY (NEEDLE) ×2 IMPLANT
NS IRRIG 1000ML POUR BTL (IV SOLUTION) ×2 IMPLANT
PACK TOTAL KNEE CUSTOM (KITS) ×2 IMPLANT
PENCIL SMOKE EVACUATOR (MISCELLANEOUS) IMPLANT
PROTECTOR NERVE ULNAR (MISCELLANEOUS) ×2 IMPLANT
SET HNDPC FAN SPRY TIP SCT (DISPOSABLE) ×1 IMPLANT
STEM POLY PAT PLY 38M KNEE (Knees) ×2 IMPLANT
STEM TIBIA 5 DEG SZ G L KNEE (Knees) ×1 IMPLANT
STRIP CLOSURE SKIN 1/2X4 (GAUZE/BANDAGES/DRESSINGS) ×2 IMPLANT
SUT BONE WAX W31G (SUTURE) ×2 IMPLANT
SUT MNCRL AB 3-0 PS2 18 (SUTURE) ×2 IMPLANT
SUT STRATAFIX 0 PDS 27 VIOLET (SUTURE) ×2
SUT STRATAFIX PDS+ 0 24IN (SUTURE) ×2 IMPLANT
SUT VIC AB 1 CT1 36 (SUTURE) ×2 IMPLANT
SUTURE STRATFX 0 PDS 27 VIOLET (SUTURE) ×1 IMPLANT
SYR CONTROL 10ML LL (SYRINGE) ×4 IMPLANT
TIBIA STEM 5 DEG SZ G L KNEE (Knees) ×2 IMPLANT
WATER STERILE IRR 1000ML POUR (IV SOLUTION) ×4 IMPLANT
WRAP KNEE MAXI GEL POST OP (GAUZE/BANDAGES/DRESSINGS) ×2 IMPLANT
YANKAUER SUCT BULB TIP 10FT TU (MISCELLANEOUS) ×2 IMPLANT

## 2019-09-24 NOTE — Op Note (Signed)
TOTAL KNEE REPLACEMENT OPERATIVE NOTE:  09/24/2019  9:58 AM  PATIENT:  Brett Rosales  74 y.o. male  PRE-OPERATIVE DIAGNOSIS:  Osteoarthritis left knee  POST-OPERATIVE DIAGNOSIS:  Osteoarthritis left knee  PROCEDURE:  Procedure(s): TOTAL KNEE ARTHROPLASTY  SURGEON:  Surgeon(s): Vickey Huger, MD  PHYSICIAN ASSISTANT: Carlyon Shadow, PA-C  ANESTHESIA:   general  SPECIMEN: None  COUNTS:  Correct  TOURNIQUET:   Total Tourniquet Time Documented: Thigh (Left) - 41 minutes Total: Thigh (Left) - 41 minutes   DICTATION:  Indication for procedure:    The patient is a 74 y.o. male who has failed conservative treatment for Osteoarthritis left knee.  Informed consent was obtained prior to anesthesia. The risks versus benefits of the operation were explain and in a way the patient can, and did, understand.    Description of procedure:     The patient was taken to the operating room and placed under anesthesia.  The patient was positioned in the usual fashion taking care that all body parts were adequately padded and/or protected.  A tourniquet was applied and the leg prepped and draped in the usual sterile fashion.  The extremity was exsanguinated with the esmarch and tourniquet inflated to 350 mmHg.  Pre-operative range of motion was normal.  A midline incision approximately 6-7 inches long was made with a #10 blade.  A new blade was used to make a parapatellar arthrotomy going 2-3 cm into the quadriceps tendon, over the patella, and alongside the medial aspect of the patellar tendon.  A synovectomy was then performed with the #10 blade and forceps. I then elevated the deep MCL off the medial tibial metaphysis subperiosteally around to the semimembranosus attachment.    I everted the patella and used calipers to measure patellar thickness.  I used the reamer to ream down to appropriate thickness to recreate the native thickness.  I then removed excess bone with the rongeur and sagittal  saw.  I used the appropriately sized template and drilled the three lug holes.  I then put the trial in place and measured the thickness with the calipers to ensure recreation of the native thickness.  The trial was then removed and the patella subluxed and the knee brought into flexion.  A homan retractor was place to retract and protect the patella and lateral structures.  A Z-retractor was place medially to protect the medial structures.  The extra-medullary alignment system was used to make cut the tibial articular surface perpendicular to the anamotic axis of the tibia and in 3 degrees of posterior slope.  The cut surface and alignment jig was removed.  I then used the intramedullary alignment guide to make a  valgus cut on the distal femur.  I then marked out the epicondylar axis on the distal femur.   I then used the anterior referencing sizer and measured the femur to be a size 11.  The 4-In-1 cutting block was screwed into place in external rotation matching the posterior condylar angle, making our cuts perpendicular to the epicondylar axis.  Anterior, posterior and chamfer cuts were made with the sagittal saw.  The cutting block and cut pieces were removed.  A lamina spreader was placed in 90 degrees of flexion.  The ACL, PCL, menisci, and posterior condylar osteophytes were removed.  A 10 mm spacer blocked was found to offer good flexion and extension gap balance after minimal in degree releasing.   The scoop retractor was then placed and the femoral finishing block was pinned  in place.  The small sagittal saw was used as well as the lug drill to finish the femur.  The block and cut surfaces were removed and the medullary canal hole filled with autograft bone from the cut pieces.  The tibia was delivered forward in deep flexion and external rotation.  A size G tray was selected and pinned into place centered on the medial 1/3 of the tibial tubercle.  The reamer and keel was used to prepare the  tibia through the tray.    I then trialed with the size 11 femur, size G tibia, a 10 mm insert and the 38 patella.  I had excellent flexion/extension gap balance, excellent patella tracking.  Flexion was full and beyond 120 degrees; extension was zero.  These components were chosen and the staff opened them to me on the back table while the knee was lavaged copiously and the cement mixed.  The soft tissue was infiltrated with 60cc of exparel 1.3% through a 21 gauge needle.  I cemented in the components and removed all excess cement.  The polyethylene tibial component was snapped into place and the knee placed in extension while cement was hardening.  The capsule was infilltrated with a 60cc exparel/marcaine/saline mixture.   Once the cement was hard, the tourniquet was let down.  Hemostasis was obtained.  The arthrotomy was closed using a #1 stratofix running suture.  The deep soft tissues were closed with #0 vicryls and the subcuticular layer closed with #2-0 vicryl.  The skin was reapproximated and closed with 3.0 Monocryl.  The wound was covered with steristrips, aquacel dressing, and a TED stocking.   The patient was then awakened, extubated, and taken to the recovery room in stable condition.  BLOOD LOSS:  364BI COMPLICATIONS:  None.  PLAN OF CARE: Discharge to home after PACU  PATIENT DISPOSITION:  PACU - hemodynamically stable.   Please fax a copy of this op note to my office at [phone] (please only include page 1 and 2 of the Case Information op note)

## 2019-09-24 NOTE — Progress Notes (Signed)
Orthopedic Tech Progress Note Patient Details:  Brett Rosales 06-11-1945 [id number]  Ortho Devices Type of Ortho Device: Bone foam zero knee Ortho Device/Splint Interventions: Application   Post Interventions Patient Tolerated: Well Instructions Provided: Care of device   Brett Rosales 09/24/2019, 10:01 AM

## 2019-09-24 NOTE — Transfer of Care (Signed)
Immediate Anesthesia Transfer of Care Note  Patient: Brett Rosales  Procedure(s) Performed: TOTAL KNEE ARTHROPLASTY (Left Knee)  Patient Location: PACU  Anesthesia Type:General and Regional  Level of Consciousness: awake, alert  and oriented  Airway & Oxygen Therapy: Patient Spontanous Breathing and Patient connected to face mask oxygen  Post-op Assessment: Report given to RN and Post -op Vital signs reviewed and stable  Post vital signs: Reviewed and stable  Last Vitals:  Vitals Value Taken Time  BP 120/73 09/24/19 0922  Temp    Pulse 49 09/24/19 0923  Resp 17 09/24/19 0923  SpO2 100 % 09/24/19 0923  Vitals shown include unvalidated device data.  Last Pain:  Vitals:   09/24/19 0608  TempSrc: Oral         Complications: No complications documented.

## 2019-09-24 NOTE — Anesthesia Procedure Notes (Signed)
Procedure Name: Intubation Date/Time: 09/24/2019 7:43 AM Performed by: Kellyn Mccary D, CRNA Pre-anesthesia Checklist: Patient identified, Emergency Drugs available, Suction available and Patient being monitored Patient Re-evaluated:Patient Re-evaluated prior to induction Oxygen Delivery Method: Circle system utilized Preoxygenation: Pre-oxygenation with 100% oxygen Induction Type: IV induction Ventilation: Mask ventilation without difficulty Laryngoscope Size: Mac and 4 Grade View: Grade II Tube type: Oral Tube size: 7.5 mm Number of attempts: 1 Airway Equipment and Method: Stylet Placement Confirmation: ETT inserted through vocal cords under direct vision,  positive ETCO2 and breath sounds checked- equal and bilateral Secured at: 23 cm Tube secured with: Tape Dental Injury: Teeth and Oropharynx as per pre-operative assessment

## 2019-09-24 NOTE — H&P (Signed)
Brett Rosales MRN:  [id number] DOB/SEX:  October 26, 1945/male  CHIEF COMPLAINT:  Painful left Knee  HISTORY: Patient is a 74 y.o. male presented with a history of pain in the left knee. Onset of symptoms was gradual starting a few years ago with gradually worsening course since that time. Patient has been treated conservatively with over-the-counter NSAIDs and activity modification. Patient currently rates pain in the knee at 10 out of 10 with activity. There is pain at night.  PAST MEDICAL HISTORY: Patient Active Problem List   Diagnosis Date Noted  . Dyslipidemia 01/23/2019  . 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 Swelling of left lower extremity 05/17/2018  . Ascending aortic aneurysm ([place]) 12/23/2017  . Typical atrial flutter ([place]) 12/16/2017  . Supraventricular tachycardia ([place]) 12/09/2017  . Essential hypertension 12/09/2017  . Dyspnea on exertion 12/09/2017  . Rheumatoid arthritis ([place]) 12/09/2017   Past Medical History:  Diagnosis Date  . Anxiety    medication made him feel flat stopped meds 09/07/2018  . Arthritis   . Depression   . Dysrhythmia 11/2017  . History of kidney stones    13 stones  . Hypertension   . Sleep apnea 03/2018   Had study after episode of disrhythmia. it was inconclusive. needs follow up   Past Surgical History:  Procedure Laterality Date  . TONSILLECTOMY AND ADENOIDECTOMY    . TOTAL HIP ARTHROPLASTY Left 09/21/2018   Procedure: TOTAL HIP ARTHROPLASTY ANTERIOR APPROACH;  Surgeon: Rod Can, MD;  Location: [facility];  Service: Orthopedics;  Laterality: Left;  Marland Kitchen VASECTOMY  1990     MEDICATIONS:   Medications Prior to Admission  Medication Sig Dispense Refill Last Dose  . amLODipine (NORVASC) 5 MG tablet Take 1 tablet (5 mg total) by mouth daily. 90 tablet 1 09/24/2019 at 0415  . donepezil (ARICEPT) 5 MG tablet Take 5 mg by mouth daily.   09/24/2019 at 0415  . DULoxetine (CYMBALTA) 30 MG capsule Take 30 mg by mouth daily.   09/23/2019 at Unknown  time  . ezetimibe (ZETIA) 10 MG tablet Take 10 mg by mouth daily.   09/24/2019 at 0415  . folic acid (FOLVITE) 1 MG tablet Take 1 mg by mouth daily.   09/23/2019 at Unknown time  . losartan (COZAAR) 100 MG tablet Take 100 mg by mouth daily.   09/23/2019 at Unknown time  . methotrexate 50 MG/2ML injection Inject 25 mg into the skin once a week.    Past Week at Unknown time  . methylphenidate (RITALIN) 20 MG tablet Take 40 mg by mouth daily.    09/23/2019 at Unknown time  . metoprolol tartrate (LOPRESSOR) 25 MG tablet TAKE 1 TABLET BY MOUTH TWICE DAILY. (Patient taking differently: Take 25 mg by mouth 2 (two) times daily. ) 180 tablet 1 09/24/2019 at 0415  . rivaroxaban (XARELTO) 20 MG TABS tablet Take 1 tablet (20 mg total) by mouth daily with supper. 180 tablet 1 09/21/2019  . traMADol (ULTRAM) 50 MG tablet Take 50 mg by mouth every 6 (six) hours as needed for moderate pain.    Past Month at Unknown time    ALLERGIES:   Allergies  Allergen Reactions  . Nsaids Other (See Comments)    "Eats hole in stomach".Marland KitchenMarland KitchenMarland KitchenIbuprofen, Meloxicam...  . Statins Other (See Comments)    Myalgias    REVIEW OF SYSTEMS:  Pertinent items are noted in HPI.   FAMILY HISTORY:   Family History  Problem Relation Age of Onset  . Diabetes Mother   .  Cancer Mother   . Hypertension Father     SOCIAL HISTORY:   Social History   Tobacco Use  . Smoking status: Former Smoker    Packs/day: 1.00    Years: 15.00    Pack years: 15.00    Types: Cigarettes  . Smokeless tobacco: Never Used  Substance Use Topics  . Alcohol use: No    Alcohol/week: 0.0 standard drinks     EXAMINATION:  Vital signs in last 24 hours: Temp:  [98 F (36.7 C)] 98 F (36.7 C) (07/19 8937) Pulse Rate:  [58] 58 (07/19 0608) Resp:  [17] 17 (07/19 0608) BP: (148)/(75) 148/75 (07/19 0608) SpO2:  [100 %] 100 % (07/19 0608)  BP (!) 148/75   Pulse (!) 58   Temp 98 F (36.7 C) (Oral)   Resp 17   SpO2 100%   General Appearance:     Alert, cooperative, no distress, appears stated age  Head:    Normocephalic, without obvious abnormality, atraumatic  Eyes:    PERRL, conjunctiva/corneas clear, EOM's intact, fundi    benign, both eyes       Ears:    Normal TM's and external ear canals, both ears  Nose:   Nares normal, septum midline, mucosa normal, no drainage    or sinus tenderness  Throat:   Lips, mucosa, and tongue normal; teeth and gums normal  Neck:   Supple, symmetrical, trachea midline, no adenopathy;       thyroid:  No enlargement/tenderness/nodules; no carotid   bruit or JVD  Back:     Symmetric, no curvature, ROM normal, no CVA tenderness  Lungs:     Clear to auscultation bilaterally, respirations unlabored  Chest wall:    No tenderness or deformity  Heart:    Regular rate and rhythm, S1 and S2 normal, no murmur, rub   or gallop  Abdomen:     Soft, non-tender, bowel sounds active all four quadrants,    no masses, no organomegaly  Genitalia:    Normal male without lesion, discharge or tenderness  Rectal:    Normal tone, normal prostate, no masses or tenderness;   guaiac negative stool  Extremities:   Extremities normal, atraumatic, no cyanosis or edema  Pulses:   2+ and symmetric all extremities  Skin:   Skin color, texture, turgor normal, no rashes or lesions  Lymph nodes:   Cervical, supraclavicular, and axillary nodes normal  Neurologic:   CNII-XII intact. Normal strength, sensation and reflexes      throughout    Musculoskeletal:  ROM 0-120, Ligaments intact,  Imaging Review Plain radiographs demonstrate severe degenerative joint disease of the left knee. The overall alignment is neutral. The bone quality appears to be good for age and reported activity level.  Assessment/Plan: Primary osteoarthritis, left knee   The patient history, physical examination and imaging studies are consistent with advanced degenerative joint disease of the left knee. The patient has failed conservative treatment.  The  clearance notes were reviewed.  After discussion with the patient it was felt that Total Knee Replacement was indicated. The procedure,  risks, and benefits of total knee arthroplasty were presented and reviewed. The risks including but not limited to aseptic loosening, infection, blood clots, vascular injury, stiffness, patella tracking problems complications among others were discussed. The patient acknowledged the explanation, agreed to proceed with the plan.  Preoperative templating of the joint replacement has been completed, documented, and submitted to the Operating Room personnel in order to optimize intra-operative equipment management.  Patient's anticipated LOS is less than 2 midnights, meeting these requirements: - Lives within 1 hour of care - Has a competent adult at home to recover with post-op recover - NO history of  - Chronic pain requiring opiods  - Diabetes  - Coronary Artery Disease  - Heart failure  - Heart attack  - Stroke  - DVT/VTE  - Cardiac arrhythmia  - Respiratory Failure/COPD  - Renal failure  - Anemia  - Advanced Liver disease       Donia Ast 09/24/2019, 6:57 AM

## 2019-09-24 NOTE — Anesthesia Postprocedure Evaluation (Signed)
Anesthesia Post Note  Patient: Brett Rosales  Procedure(s) Performed: TOTAL KNEE ARTHROPLASTY (Left Knee)     Patient location during evaluation: PACU Anesthesia Type: General Level of consciousness: awake and alert and oriented Pain management: pain level controlled Vital Signs Assessment: post-procedure vital signs reviewed and stable Respiratory status: spontaneous breathing, nonlabored ventilation and respiratory function stable Cardiovascular status: blood pressure returned to baseline and stable Postop Assessment: no apparent nausea or vomiting Anesthetic complications: no   No complications documented.  Last Vitals:  Vitals:   09/24/19 0945 09/24/19 1000  BP: 135/77 135/77  Pulse: (!) 47 (!) 49  Resp: 12 14  Temp:  36.6 C  SpO2: 100% 100%    Last Pain:  Vitals:   09/24/19 1000  TempSrc: Oral  PainSc: 3                  Thomas Mabry A.

## 2019-09-24 NOTE — Evaluation (Signed)
Physical Therapy Evaluation Patient Details Name: Brett Rosales MRN: [id number] DOB: Jan 10, 1946 Today's Date: 09/24/2019   History of Present Illness  74 yo male s/p L TKA; PMH: L DA THA in 2020, aortic anyeurym. HTN, OA  Clinical Impression  Patient evaluated by Physical Therapy with no further acute PT needs identified. All education has been completed and the patient has no further questions.  Reviewed TKA HEP/gait--RW safety. Pt familiar with techniques from [place] last year, ready to d/c with family assist as needed.  See below for any follow-up Physical Therapy or equipment needs. PT is signing off. Thank you for this referral.     Follow Up Recommendations Follow surgeon's recommendation for DC plan and follow-up therapies    Equipment Recommendations  None recommended by PT    Recommendations for Other Services       Precautions / Restrictions Precautions Precautions: Knee Restrictions Weight Bearing Restrictions: No Other Position/Activity Restrictions: WBAT      Mobility  Bed Mobility Overal bed mobility: Needs Assistance Bed Mobility: Supine to Sit;Sit to Supine     Supine to sit: Min guard Sit to supine: Min guard   General bed mobility comments: for safety; instructedin useof gait belt as leg lifter  Transfers Overall transfer level: Needs assistance Equipment used: Rolling walker (2 wheeled) Transfers: Sit to/from Stand Sit to Stand: Min guard;Supervision         General transfer comment: cues for hand placement  Ambulation/Gait Ambulation/Gait assistance: Min guard Gait Distance (Feet): 110 Feet Assistive device: Rolling walker (2 wheeled) Gait Pattern/deviations: Step-to pattern;Decreased stance time - left     General Gait Details: cues for sequence and RW position  Stairs            Wheelchair Mobility    Modified Rankin (Stroke Patients Only)       Balance                                              Pertinent Vitals/Pain Pain Assessment: 0-10 Pain Score: 4  Pain Location: L knee Pain Descriptors / Indicators: Grimacing;Discomfort Pain Intervention(s): Limited activity within patient's tolerance;Monitored during session;Premedicated before session    Home Living Family/patient expects to be discharged to:: Private residence Living Arrangements: Spouse/significant other Available Help at Discharge: Family;Available 24 hours/day Type of Home: House Home Access: Level entry     Home Layout: One level Home Equipment: Walker - 2 wheels;Bedside commode;Shower seat      Prior Function Level of Independence: Independent               Hand Dominance        Extremity/Trunk Assessment   Upper Extremity Assessment Upper Extremity Assessment: Overall WFL for tasks assessed    Lower Extremity Assessment Lower Extremity Assessment: LLE deficits/detail LLE Deficits / Details: knee AAROM ~ 5 to 80 degrees. knee extension and hip flexion 2+ to 3/5       Communication   Communication: No difficulties  Cognition Arousal/Alertness: Awake/alert Behavior During Therapy: WFL for tasks assessed/performed Overall Cognitive Status: Within Functional Limits for tasks assessed                                        General Comments      Exercises Total Joint Exercises  Ankle Circles/Pumps: AROM;Both;10 reps Quad Sets: Both;AROM;5 reps Heel Slides: AAROM;AROM;Left;5 reps Hip ABduction/ADduction: AROM;Left;10 reps Straight Leg Raises: AROM;AAROM;Left;10 reps   Assessment/Plan    PT Assessment All further PT needs can be met in the next venue of care  PT Problem List         PT Treatment Interventions      PT Goals (Current goals can be found in the Care Plan section)  Acute Rehab PT Goals Patient Stated Goal: home today PT Goal Formulation: All assessment and education complete, DC therapy    Frequency     Barriers to discharge         Co-evaluation               AM-PAC PT "6 Clicks" Mobility  Outcome Measure Help needed turning from your back to your side while in a flat bed without using bedrails?: A Little Help needed moving from lying on your back to sitting on the side of a flat bed without using bedrails?: A Little Help needed moving to and from a bed to a chair (including a wheelchair)?: A Little Help needed standing up from a chair using your arms (e.g., wheelchair or bedside chair)?: A Little Help needed to walk in hospital room?: A Little Help needed climbing 3-5 steps with a railing? : A Little 6 Click Score: 18    End of Session Equipment Utilized During Treatment: Gait belt Activity Tolerance: Patient tolerated treatment well Patient left: in chair;with call bell/phone within reach Nurse Communication: Mobility status PT Visit Diagnosis: Difficulty in walking, not elsewhere classified (R26.2)    Time: 3818-4037 PT Time Calculation (min) (ACUTE ONLY): 30 min   Charges:   PT Evaluation $PT Eval Low Complexity: 1 Low PT Treatments $Gait Training: 8-22 mins        Baxter Flattery, PT  Acute Rehab Dept ([place]) [phone] Pager [phone]  09/24/2019   [facility] 09/24/2019, 12:04 PM

## 2019-09-24 NOTE — Anesthesia Procedure Notes (Addendum)
Anesthesia Regional Block: Adductor canal block   Pre-Anesthetic Checklist: ,, timeout performed, Correct Patient, Correct Site, Correct Laterality, Correct Procedure, Correct Position, site marked, Risks and benefits discussed,  Surgical consent,  Pre-op evaluation,  At surgeon's request and post-op pain management  Laterality: Left  Prep: chloraprep       Needles:  Injection technique: Single-shot  Needle Type: Echogenic Stimulator Needle     Needle Length: 9cm  Needle Gauge: 21   Needle insertion depth: 7 cm   Additional Needles:   Procedures:,,,, ultrasound used (permanent image in chart),,,,  Narrative:  Start time: 09/24/2019 7:01 AM End time: 09/24/2019 7:06 AM Injection made incrementally with aspirations every 5 mL.  Performed by: Personally  Anesthesiologist: Josephine Igo, MD  Additional Notes: Timeout performed. Patient sedated. Relevant anatomy ID'd using [place]. Incremental 2-10ml injection of LA with frequent aspiration. Patient tolerated procedure well.        Left Adductor Canal Block

## 2019-09-26 ENCOUNTER — Encounter (HOSPITAL_COMMUNITY): Payer: Self-pay | Admitting: Orthopedic Surgery

## 2019-10-01 DIAGNOSIS — M25462 Effusion, left knee: Secondary | ICD-10-CM | POA: Diagnosis not present

## 2019-10-01 DIAGNOSIS — Z96652 Presence of left artificial knee joint: Secondary | ICD-10-CM | POA: Diagnosis not present

## 2019-10-01 DIAGNOSIS — M25562 Pain in left knee: Secondary | ICD-10-CM | POA: Diagnosis not present

## 2019-10-01 DIAGNOSIS — M62552 Muscle wasting and atrophy, not elsewhere classified, left thigh: Secondary | ICD-10-CM | POA: Diagnosis not present

## 2019-10-01 DIAGNOSIS — R2689 Other abnormalities of gait and mobility: Secondary | ICD-10-CM | POA: Diagnosis not present

## 2019-10-03 DIAGNOSIS — R2689 Other abnormalities of gait and mobility: Secondary | ICD-10-CM | POA: Diagnosis not present

## 2019-10-03 DIAGNOSIS — M25462 Effusion, left knee: Secondary | ICD-10-CM | POA: Diagnosis not present

## 2019-10-03 DIAGNOSIS — M25562 Pain in left knee: Secondary | ICD-10-CM | POA: Diagnosis not present

## 2019-10-03 DIAGNOSIS — Z96652 Presence of left artificial knee joint: Secondary | ICD-10-CM | POA: Diagnosis not present

## 2019-10-03 DIAGNOSIS — M62552 Muscle wasting and atrophy, not elsewhere classified, left thigh: Secondary | ICD-10-CM | POA: Diagnosis not present

## 2019-10-04 DIAGNOSIS — Z471 Aftercare following joint replacement surgery: Secondary | ICD-10-CM | POA: Diagnosis not present

## 2019-10-04 DIAGNOSIS — Z96652 Presence of left artificial knee joint: Secondary | ICD-10-CM | POA: Insufficient documentation

## 2019-10-04 DIAGNOSIS — M25562 Pain in left knee: Secondary | ICD-10-CM | POA: Diagnosis not present

## 2019-10-04 DIAGNOSIS — Z96651 Presence of right artificial knee joint: Secondary | ICD-10-CM | POA: Insufficient documentation

## 2019-10-04 HISTORY — DX: Presence of right artificial knee joint: Z96.651

## 2019-10-04 HISTORY — DX: Presence of left artificial knee joint: Z96.652

## 2019-10-05 DIAGNOSIS — M25562 Pain in left knee: Secondary | ICD-10-CM | POA: Diagnosis not present

## 2019-10-05 DIAGNOSIS — R2689 Other abnormalities of gait and mobility: Secondary | ICD-10-CM | POA: Diagnosis not present

## 2019-10-05 DIAGNOSIS — M62552 Muscle wasting and atrophy, not elsewhere classified, left thigh: Secondary | ICD-10-CM | POA: Diagnosis not present

## 2019-10-05 DIAGNOSIS — M25462 Effusion, left knee: Secondary | ICD-10-CM | POA: Diagnosis not present

## 2019-10-05 DIAGNOSIS — Z96652 Presence of left artificial knee joint: Secondary | ICD-10-CM | POA: Diagnosis not present

## 2019-10-06 DIAGNOSIS — I10 Essential (primary) hypertension: Secondary | ICD-10-CM | POA: Diagnosis not present

## 2019-10-06 DIAGNOSIS — E785 Hyperlipidemia, unspecified: Secondary | ICD-10-CM | POA: Diagnosis not present

## 2019-10-08 DIAGNOSIS — R2689 Other abnormalities of gait and mobility: Secondary | ICD-10-CM | POA: Diagnosis not present

## 2019-10-08 DIAGNOSIS — Z96652 Presence of left artificial knee joint: Secondary | ICD-10-CM | POA: Diagnosis not present

## 2019-10-08 DIAGNOSIS — M62552 Muscle wasting and atrophy, not elsewhere classified, left thigh: Secondary | ICD-10-CM | POA: Diagnosis not present

## 2019-10-08 DIAGNOSIS — M25562 Pain in left knee: Secondary | ICD-10-CM | POA: Diagnosis not present

## 2019-10-08 DIAGNOSIS — M25462 Effusion, left knee: Secondary | ICD-10-CM | POA: Diagnosis not present

## 2019-10-10 DIAGNOSIS — Z96652 Presence of left artificial knee joint: Secondary | ICD-10-CM | POA: Diagnosis not present

## 2019-10-10 DIAGNOSIS — M62552 Muscle wasting and atrophy, not elsewhere classified, left thigh: Secondary | ICD-10-CM | POA: Diagnosis not present

## 2019-10-10 DIAGNOSIS — M25462 Effusion, left knee: Secondary | ICD-10-CM | POA: Diagnosis not present

## 2019-10-10 DIAGNOSIS — M25562 Pain in left knee: Secondary | ICD-10-CM | POA: Diagnosis not present

## 2019-10-10 DIAGNOSIS — R2689 Other abnormalities of gait and mobility: Secondary | ICD-10-CM | POA: Diagnosis not present

## 2019-10-12 DIAGNOSIS — M25562 Pain in left knee: Secondary | ICD-10-CM | POA: Diagnosis not present

## 2019-10-12 DIAGNOSIS — M62552 Muscle wasting and atrophy, not elsewhere classified, left thigh: Secondary | ICD-10-CM | POA: Diagnosis not present

## 2019-10-12 DIAGNOSIS — M25462 Effusion, left knee: Secondary | ICD-10-CM | POA: Diagnosis not present

## 2019-10-12 DIAGNOSIS — R2689 Other abnormalities of gait and mobility: Secondary | ICD-10-CM | POA: Diagnosis not present

## 2019-10-12 DIAGNOSIS — Z96652 Presence of left artificial knee joint: Secondary | ICD-10-CM | POA: Diagnosis not present

## 2019-10-15 DIAGNOSIS — Z96652 Presence of left artificial knee joint: Secondary | ICD-10-CM | POA: Diagnosis not present

## 2019-10-15 DIAGNOSIS — M62552 Muscle wasting and atrophy, not elsewhere classified, left thigh: Secondary | ICD-10-CM | POA: Diagnosis not present

## 2019-10-15 DIAGNOSIS — M25562 Pain in left knee: Secondary | ICD-10-CM | POA: Diagnosis not present

## 2019-10-15 DIAGNOSIS — M25462 Effusion, left knee: Secondary | ICD-10-CM | POA: Diagnosis not present

## 2019-10-15 DIAGNOSIS — R2689 Other abnormalities of gait and mobility: Secondary | ICD-10-CM | POA: Diagnosis not present

## 2019-10-16 DIAGNOSIS — G629 Polyneuropathy, unspecified: Secondary | ICD-10-CM | POA: Diagnosis not present

## 2019-10-16 DIAGNOSIS — Z6838 Body mass index (BMI) 38.0-38.9, adult: Secondary | ICD-10-CM | POA: Diagnosis not present

## 2019-10-17 DIAGNOSIS — Z96652 Presence of left artificial knee joint: Secondary | ICD-10-CM | POA: Diagnosis not present

## 2019-10-17 DIAGNOSIS — M62552 Muscle wasting and atrophy, not elsewhere classified, left thigh: Secondary | ICD-10-CM | POA: Diagnosis not present

## 2019-10-17 DIAGNOSIS — M25462 Effusion, left knee: Secondary | ICD-10-CM | POA: Diagnosis not present

## 2019-10-17 DIAGNOSIS — R2689 Other abnormalities of gait and mobility: Secondary | ICD-10-CM | POA: Diagnosis not present

## 2019-10-17 DIAGNOSIS — M25562 Pain in left knee: Secondary | ICD-10-CM | POA: Diagnosis not present

## 2019-10-22 DIAGNOSIS — Z96652 Presence of left artificial knee joint: Secondary | ICD-10-CM | POA: Diagnosis not present

## 2019-10-22 DIAGNOSIS — M62552 Muscle wasting and atrophy, not elsewhere classified, left thigh: Secondary | ICD-10-CM | POA: Diagnosis not present

## 2019-10-22 DIAGNOSIS — R2689 Other abnormalities of gait and mobility: Secondary | ICD-10-CM | POA: Diagnosis not present

## 2019-10-22 DIAGNOSIS — M25462 Effusion, left knee: Secondary | ICD-10-CM | POA: Diagnosis not present

## 2019-10-22 DIAGNOSIS — M25562 Pain in left knee: Secondary | ICD-10-CM | POA: Diagnosis not present

## 2019-10-23 ENCOUNTER — Ambulatory Visit: Payer: Medicare Other | Admitting: Cardiology

## 2019-10-23 DIAGNOSIS — M545 Low back pain: Secondary | ICD-10-CM | POA: Diagnosis not present

## 2019-10-23 DIAGNOSIS — Z79899 Other long term (current) drug therapy: Secondary | ICD-10-CM | POA: Diagnosis not present

## 2019-10-23 DIAGNOSIS — M0609 Rheumatoid arthritis without rheumatoid factor, multiple sites: Secondary | ICD-10-CM | POA: Diagnosis not present

## 2019-10-23 DIAGNOSIS — M150 Primary generalized (osteo)arthritis: Secondary | ICD-10-CM | POA: Diagnosis not present

## 2019-10-23 DIAGNOSIS — M2550 Pain in unspecified joint: Secondary | ICD-10-CM | POA: Diagnosis not present

## 2019-10-23 DIAGNOSIS — E669 Obesity, unspecified: Secondary | ICD-10-CM | POA: Diagnosis not present

## 2019-10-23 DIAGNOSIS — Z6836 Body mass index (BMI) 36.0-36.9, adult: Secondary | ICD-10-CM | POA: Diagnosis not present

## 2019-10-24 DIAGNOSIS — R2689 Other abnormalities of gait and mobility: Secondary | ICD-10-CM | POA: Diagnosis not present

## 2019-10-24 DIAGNOSIS — Z96652 Presence of left artificial knee joint: Secondary | ICD-10-CM | POA: Diagnosis not present

## 2019-10-24 DIAGNOSIS — M25562 Pain in left knee: Secondary | ICD-10-CM | POA: Diagnosis not present

## 2019-10-24 DIAGNOSIS — M25462 Effusion, left knee: Secondary | ICD-10-CM | POA: Diagnosis not present

## 2019-10-24 DIAGNOSIS — M62552 Muscle wasting and atrophy, not elsewhere classified, left thigh: Secondary | ICD-10-CM | POA: Diagnosis not present

## 2019-10-25 DIAGNOSIS — M9903 Segmental and somatic dysfunction of lumbar region: Secondary | ICD-10-CM | POA: Diagnosis not present

## 2019-10-25 DIAGNOSIS — M545 Low back pain: Secondary | ICD-10-CM | POA: Diagnosis not present

## 2019-10-25 DIAGNOSIS — M9902 Segmental and somatic dysfunction of thoracic region: Secondary | ICD-10-CM | POA: Diagnosis not present

## 2019-10-25 DIAGNOSIS — M9905 Segmental and somatic dysfunction of pelvic region: Secondary | ICD-10-CM | POA: Diagnosis not present

## 2019-10-26 DIAGNOSIS — Z20828 Contact with and (suspected) exposure to other viral communicable diseases: Secondary | ICD-10-CM | POA: Diagnosis not present

## 2019-10-26 DIAGNOSIS — R2689 Other abnormalities of gait and mobility: Secondary | ICD-10-CM | POA: Diagnosis not present

## 2019-10-26 DIAGNOSIS — J309 Allergic rhinitis, unspecified: Secondary | ICD-10-CM | POA: Diagnosis not present

## 2019-10-26 DIAGNOSIS — M62552 Muscle wasting and atrophy, not elsewhere classified, left thigh: Secondary | ICD-10-CM | POA: Diagnosis not present

## 2019-10-26 DIAGNOSIS — M25562 Pain in left knee: Secondary | ICD-10-CM | POA: Diagnosis not present

## 2019-10-26 DIAGNOSIS — Z96652 Presence of left artificial knee joint: Secondary | ICD-10-CM | POA: Diagnosis not present

## 2019-10-26 DIAGNOSIS — M25462 Effusion, left knee: Secondary | ICD-10-CM | POA: Diagnosis not present

## 2019-10-29 DIAGNOSIS — M0609 Rheumatoid arthritis without rheumatoid factor, multiple sites: Secondary | ICD-10-CM | POA: Diagnosis not present

## 2019-10-30 DIAGNOSIS — M545 Low back pain: Secondary | ICD-10-CM | POA: Diagnosis not present

## 2019-10-30 DIAGNOSIS — M25462 Effusion, left knee: Secondary | ICD-10-CM | POA: Diagnosis not present

## 2019-10-30 DIAGNOSIS — M62552 Muscle wasting and atrophy, not elsewhere classified, left thigh: Secondary | ICD-10-CM | POA: Diagnosis not present

## 2019-10-30 DIAGNOSIS — Z96652 Presence of left artificial knee joint: Secondary | ICD-10-CM | POA: Diagnosis not present

## 2019-10-30 DIAGNOSIS — R2689 Other abnormalities of gait and mobility: Secondary | ICD-10-CM | POA: Diagnosis not present

## 2019-10-30 DIAGNOSIS — M9903 Segmental and somatic dysfunction of lumbar region: Secondary | ICD-10-CM | POA: Diagnosis not present

## 2019-10-30 DIAGNOSIS — M9902 Segmental and somatic dysfunction of thoracic region: Secondary | ICD-10-CM | POA: Diagnosis not present

## 2019-10-30 DIAGNOSIS — M9905 Segmental and somatic dysfunction of pelvic region: Secondary | ICD-10-CM | POA: Diagnosis not present

## 2019-10-30 DIAGNOSIS — M25562 Pain in left knee: Secondary | ICD-10-CM | POA: Diagnosis not present

## 2019-11-01 ENCOUNTER — Ambulatory Visit: Payer: Medicare Other | Admitting: Neurology

## 2019-11-02 DIAGNOSIS — Z96652 Presence of left artificial knee joint: Secondary | ICD-10-CM | POA: Diagnosis not present

## 2019-11-02 DIAGNOSIS — M25562 Pain in left knee: Secondary | ICD-10-CM | POA: Diagnosis not present

## 2019-11-02 DIAGNOSIS — R2689 Other abnormalities of gait and mobility: Secondary | ICD-10-CM | POA: Diagnosis not present

## 2019-11-02 DIAGNOSIS — M62552 Muscle wasting and atrophy, not elsewhere classified, left thigh: Secondary | ICD-10-CM | POA: Diagnosis not present

## 2019-11-02 DIAGNOSIS — M25462 Effusion, left knee: Secondary | ICD-10-CM | POA: Diagnosis not present

## 2019-11-06 DIAGNOSIS — R2689 Other abnormalities of gait and mobility: Secondary | ICD-10-CM | POA: Diagnosis not present

## 2019-11-06 DIAGNOSIS — M25462 Effusion, left knee: Secondary | ICD-10-CM | POA: Diagnosis not present

## 2019-11-06 DIAGNOSIS — Z96652 Presence of left artificial knee joint: Secondary | ICD-10-CM | POA: Diagnosis not present

## 2019-11-06 DIAGNOSIS — M25562 Pain in left knee: Secondary | ICD-10-CM | POA: Diagnosis not present

## 2019-11-06 DIAGNOSIS — I10 Essential (primary) hypertension: Secondary | ICD-10-CM | POA: Diagnosis not present

## 2019-11-06 DIAGNOSIS — M62552 Muscle wasting and atrophy, not elsewhere classified, left thigh: Secondary | ICD-10-CM | POA: Diagnosis not present

## 2019-11-06 DIAGNOSIS — E785 Hyperlipidemia, unspecified: Secondary | ICD-10-CM | POA: Diagnosis not present

## 2019-11-08 DIAGNOSIS — R2689 Other abnormalities of gait and mobility: Secondary | ICD-10-CM | POA: Diagnosis not present

## 2019-11-08 DIAGNOSIS — M62552 Muscle wasting and atrophy, not elsewhere classified, left thigh: Secondary | ICD-10-CM | POA: Diagnosis not present

## 2019-11-08 DIAGNOSIS — M25562 Pain in left knee: Secondary | ICD-10-CM | POA: Diagnosis not present

## 2019-11-08 DIAGNOSIS — M25462 Effusion, left knee: Secondary | ICD-10-CM | POA: Diagnosis not present

## 2019-11-08 DIAGNOSIS — Z96652 Presence of left artificial knee joint: Secondary | ICD-10-CM | POA: Diagnosis not present

## 2019-11-09 ENCOUNTER — Ambulatory Visit: Payer: Medicare Other | Admitting: Neurology

## 2019-11-13 DIAGNOSIS — R2689 Other abnormalities of gait and mobility: Secondary | ICD-10-CM | POA: Diagnosis not present

## 2019-11-13 DIAGNOSIS — M25462 Effusion, left knee: Secondary | ICD-10-CM | POA: Diagnosis not present

## 2019-11-13 DIAGNOSIS — M62552 Muscle wasting and atrophy, not elsewhere classified, left thigh: Secondary | ICD-10-CM | POA: Diagnosis not present

## 2019-11-13 DIAGNOSIS — Z96652 Presence of left artificial knee joint: Secondary | ICD-10-CM | POA: Diagnosis not present

## 2019-11-13 DIAGNOSIS — M25562 Pain in left knee: Secondary | ICD-10-CM | POA: Diagnosis not present

## 2019-11-16 DIAGNOSIS — M25462 Effusion, left knee: Secondary | ICD-10-CM | POA: Diagnosis not present

## 2019-11-16 DIAGNOSIS — M62552 Muscle wasting and atrophy, not elsewhere classified, left thigh: Secondary | ICD-10-CM | POA: Diagnosis not present

## 2019-11-16 DIAGNOSIS — R2689 Other abnormalities of gait and mobility: Secondary | ICD-10-CM | POA: Diagnosis not present

## 2019-11-16 DIAGNOSIS — Z96652 Presence of left artificial knee joint: Secondary | ICD-10-CM | POA: Diagnosis not present

## 2019-11-16 DIAGNOSIS — M25562 Pain in left knee: Secondary | ICD-10-CM | POA: Diagnosis not present

## 2019-11-20 DIAGNOSIS — Z96652 Presence of left artificial knee joint: Secondary | ICD-10-CM | POA: Diagnosis not present

## 2019-11-20 DIAGNOSIS — M25562 Pain in left knee: Secondary | ICD-10-CM | POA: Diagnosis not present

## 2019-11-20 DIAGNOSIS — M62552 Muscle wasting and atrophy, not elsewhere classified, left thigh: Secondary | ICD-10-CM | POA: Diagnosis not present

## 2019-11-20 DIAGNOSIS — R2689 Other abnormalities of gait and mobility: Secondary | ICD-10-CM | POA: Diagnosis not present

## 2019-11-20 DIAGNOSIS — M25462 Effusion, left knee: Secondary | ICD-10-CM | POA: Diagnosis not present

## 2019-11-22 DIAGNOSIS — M545 Low back pain: Secondary | ICD-10-CM | POA: Diagnosis not present

## 2019-11-22 DIAGNOSIS — Z96652 Presence of left artificial knee joint: Secondary | ICD-10-CM | POA: Diagnosis not present

## 2019-11-22 DIAGNOSIS — R2689 Other abnormalities of gait and mobility: Secondary | ICD-10-CM | POA: Diagnosis not present

## 2019-11-22 DIAGNOSIS — M62552 Muscle wasting and atrophy, not elsewhere classified, left thigh: Secondary | ICD-10-CM | POA: Diagnosis not present

## 2019-11-22 DIAGNOSIS — M5432 Sciatica, left side: Secondary | ICD-10-CM | POA: Diagnosis not present

## 2019-11-26 DIAGNOSIS — M545 Low back pain: Secondary | ICD-10-CM | POA: Diagnosis not present

## 2019-11-26 DIAGNOSIS — Z96652 Presence of left artificial knee joint: Secondary | ICD-10-CM | POA: Diagnosis not present

## 2019-11-26 DIAGNOSIS — M5432 Sciatica, left side: Secondary | ICD-10-CM | POA: Diagnosis not present

## 2019-11-26 DIAGNOSIS — R2689 Other abnormalities of gait and mobility: Secondary | ICD-10-CM | POA: Diagnosis not present

## 2019-11-26 DIAGNOSIS — M62552 Muscle wasting and atrophy, not elsewhere classified, left thigh: Secondary | ICD-10-CM | POA: Diagnosis not present

## 2019-11-28 DIAGNOSIS — R2689 Other abnormalities of gait and mobility: Secondary | ICD-10-CM | POA: Diagnosis not present

## 2019-11-28 DIAGNOSIS — M5432 Sciatica, left side: Secondary | ICD-10-CM | POA: Diagnosis not present

## 2019-11-28 DIAGNOSIS — M62552 Muscle wasting and atrophy, not elsewhere classified, left thigh: Secondary | ICD-10-CM | POA: Diagnosis not present

## 2019-11-28 DIAGNOSIS — Z96652 Presence of left artificial knee joint: Secondary | ICD-10-CM | POA: Diagnosis not present

## 2019-11-28 DIAGNOSIS — M545 Low back pain: Secondary | ICD-10-CM | POA: Diagnosis not present

## 2019-11-30 DIAGNOSIS — Z96652 Presence of left artificial knee joint: Secondary | ICD-10-CM | POA: Diagnosis not present

## 2019-11-30 DIAGNOSIS — R2689 Other abnormalities of gait and mobility: Secondary | ICD-10-CM | POA: Diagnosis not present

## 2019-11-30 DIAGNOSIS — M545 Low back pain: Secondary | ICD-10-CM | POA: Diagnosis not present

## 2019-11-30 DIAGNOSIS — M62552 Muscle wasting and atrophy, not elsewhere classified, left thigh: Secondary | ICD-10-CM | POA: Diagnosis not present

## 2019-11-30 DIAGNOSIS — M5432 Sciatica, left side: Secondary | ICD-10-CM | POA: Diagnosis not present

## 2019-12-03 ENCOUNTER — Other Ambulatory Visit: Payer: Self-pay | Admitting: Orthopedic Surgery

## 2019-12-06 DIAGNOSIS — D519 Vitamin B12 deficiency anemia, unspecified: Secondary | ICD-10-CM | POA: Diagnosis not present

## 2019-12-06 DIAGNOSIS — I10 Essential (primary) hypertension: Secondary | ICD-10-CM | POA: Diagnosis not present

## 2019-12-06 DIAGNOSIS — E785 Hyperlipidemia, unspecified: Secondary | ICD-10-CM | POA: Diagnosis not present

## 2019-12-10 DIAGNOSIS — Z96652 Presence of left artificial knee joint: Secondary | ICD-10-CM | POA: Diagnosis not present

## 2019-12-10 DIAGNOSIS — M5432 Sciatica, left side: Secondary | ICD-10-CM | POA: Diagnosis not present

## 2019-12-10 DIAGNOSIS — M0609 Rheumatoid arthritis without rheumatoid factor, multiple sites: Secondary | ICD-10-CM | POA: Diagnosis not present

## 2019-12-10 DIAGNOSIS — M62552 Muscle wasting and atrophy, not elsewhere classified, left thigh: Secondary | ICD-10-CM | POA: Diagnosis not present

## 2019-12-10 DIAGNOSIS — M545 Low back pain, unspecified: Secondary | ICD-10-CM | POA: Diagnosis not present

## 2019-12-10 DIAGNOSIS — R2689 Other abnormalities of gait and mobility: Secondary | ICD-10-CM | POA: Diagnosis not present

## 2019-12-12 DIAGNOSIS — R2689 Other abnormalities of gait and mobility: Secondary | ICD-10-CM | POA: Diagnosis not present

## 2019-12-12 DIAGNOSIS — M1990 Unspecified osteoarthritis, unspecified site: Secondary | ICD-10-CM | POA: Insufficient documentation

## 2019-12-12 DIAGNOSIS — F32A Depression, unspecified: Secondary | ICD-10-CM | POA: Insufficient documentation

## 2019-12-12 DIAGNOSIS — F419 Anxiety disorder, unspecified: Secondary | ICD-10-CM | POA: Insufficient documentation

## 2019-12-12 DIAGNOSIS — I10 Essential (primary) hypertension: Secondary | ICD-10-CM | POA: Insufficient documentation

## 2019-12-12 DIAGNOSIS — M5432 Sciatica, left side: Secondary | ICD-10-CM | POA: Diagnosis not present

## 2019-12-12 DIAGNOSIS — M62552 Muscle wasting and atrophy, not elsewhere classified, left thigh: Secondary | ICD-10-CM | POA: Diagnosis not present

## 2019-12-12 DIAGNOSIS — Z96652 Presence of left artificial knee joint: Secondary | ICD-10-CM | POA: Diagnosis not present

## 2019-12-12 DIAGNOSIS — Z87442 Personal history of urinary calculi: Secondary | ICD-10-CM | POA: Insufficient documentation

## 2019-12-12 DIAGNOSIS — M545 Low back pain, unspecified: Secondary | ICD-10-CM | POA: Diagnosis not present

## 2019-12-13 ENCOUNTER — Encounter: Payer: Self-pay | Admitting: Cardiology

## 2019-12-13 ENCOUNTER — Ambulatory Visit (INDEPENDENT_AMBULATORY_CARE_PROVIDER_SITE_OTHER): Payer: Medicare Other | Admitting: Cardiology

## 2019-12-13 ENCOUNTER — Other Ambulatory Visit: Payer: Self-pay

## 2019-12-13 VITALS — BP 130/96 | HR 62 | Ht 74.0 in | Wt 284.0 lb

## 2019-12-13 DIAGNOSIS — M069 Rheumatoid arthritis, unspecified: Secondary | ICD-10-CM | POA: Diagnosis not present

## 2019-12-13 DIAGNOSIS — I712 Thoracic aortic aneurysm, without rupture: Secondary | ICD-10-CM

## 2019-12-13 DIAGNOSIS — I483 Typical atrial flutter: Secondary | ICD-10-CM | POA: Diagnosis not present

## 2019-12-13 DIAGNOSIS — I7121 Aneurysm of the ascending aorta, without rupture: Secondary | ICD-10-CM

## 2019-12-13 DIAGNOSIS — I10 Essential (primary) hypertension: Secondary | ICD-10-CM | POA: Diagnosis not present

## 2019-12-13 DIAGNOSIS — E785 Hyperlipidemia, unspecified: Secondary | ICD-10-CM

## 2019-12-13 NOTE — Progress Notes (Signed)
Cardiology Office Note:    Date:  12/13/2019   ID:  Brett Rosales, DOB Jan 24, 1946, MRN [id number]  PCP:  Ronita Hipps, MD  Cardiologist:  Jenne Campus, MD    Referring MD: Ronita Hipps, MD   Chief Complaint  Patient presents with  . Follow-up  I am doing fine, and need right knee replacement surgery.  History of Present Illness:    Brett Rosales is a 74 y.o. male past medical history significant for paroxysmal atrial flutter, anticoagulated, ascending aortic aneurysm, dyslipidemia, dyspnea on exertion.  Comes today to my office for follow-up.  Overall doing well.  Asymptomatic.  He is very happy with left knee replacement surgery.  He is participating in rehab with no difficulties.  Recently he suffered from sciatica but otherwise he is able to walk climb stairs with no difficulties.  Denies have any palpitations lately.  Past Medical History:  Diagnosis Date  . Acute foreign body of ear canal 11/12/2015  . Anxiety    medication made him feel flat stopped meds 09/07/2018  . Arthritis   . Ascending aortic aneurysm ([place]) 12/23/2017  . Bilateral chronic knee pain 08/14/2019  . Depression   . Dyslipidemia 01/23/2019  . Dyspnea on exertion 12/09/2017  . Dysrhythmia 11/2017  . Essential hypertension 12/09/2017  . History of kidney stones    13 stones  . Hypertension   . 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 Rheumatoid arthritis ([place]) 12/09/2017  . 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 Sleep apnea 03/2018   Had study after episode of disrhythmia. it was inconclusive. needs follow up  . Supraventricular tachycardia ([place]) 12/09/2017  . Swelling of left lower extremity 05/17/2018  . Typical atrial flutter ([place]) 12/16/2017    Past Surgical History:  Procedure Laterality Date  . TONSILLECTOMY AND ADENOIDECTOMY    . TOTAL HIP ARTHROPLASTY Left 09/21/2018   Procedure: TOTAL HIP ARTHROPLASTY ANTERIOR APPROACH;  Surgeon: Rod Can, MD;  Location: [facility];   Service: Orthopedics;  Laterality: Left;  . TOTAL KNEE ARTHROPLASTY Left 09/24/2019   Procedure: TOTAL KNEE ARTHROPLASTY;  Surgeon: Vickey Huger, MD;  Location: [facility];  Service: Orthopedics;  Laterality: Left;  Marland Kitchen VASECTOMY  1990    Current Medications: Current Meds  Medication Sig  . donepezil (ARICEPT) 5 MG tablet Take 5 mg by mouth daily.  . DULoxetine (CYMBALTA) 30 MG capsule Take 30 mg by mouth daily.  Marland Kitchen ezetimibe (ZETIA) 10 MG tablet Take 10 mg by mouth daily.  . folic acid (FOLVITE) 1 MG tablet Take 1 mg by mouth daily.  Marland Kitchen gabapentin (NEURONTIN) 300 MG capsule Take 1 capsule (300 mg total) by mouth 3 (three) times daily.  Marland Kitchen losartan (COZAAR) 100 MG tablet Take 100 mg by mouth daily.  . methotrexate 50 MG/2ML injection Inject 25 mg into the skin once a week.   . methylphenidate (RITALIN) 20 MG tablet Take 40 mg by mouth daily.   . metoprolol tartrate (LOPRESSOR) 25 MG tablet TAKE 1 TABLET BY MOUTH TWICE DAILY.  . rivaroxaban (XARELTO) 20 MG TABS tablet Take 1 tablet (20 mg total) by mouth daily with supper. Start 7/20     Allergies:   Nsaids and Statins   Social History   Socioeconomic History  . Marital status: Married    Spouse name: Not on file  . Number of children: Not on file  . Years of education: Not on file  . Highest education level: Not on file  Occupational History  . Not on file  Tobacco Use  . Smoking status: Former Smoker    Packs/day: 1.00    Years: 15.00    Pack years: 15.00    Types: Cigarettes  . Smokeless tobacco: Never Used  Vaping Use  . Vaping Use: Never used  Substance and Sexual Activity  . Alcohol use: No    Alcohol/week: 0.0 standard drinks  . Drug use: No  . Sexual activity: Not on file  Other Topics Concern  . Not on file  Social History Narrative  . Not on file   Social Determinants of Health   Financial Resource Strain:   . Difficulty of Paying Living Expenses: Not on file  Food Insecurity:   . Worried About Sales executive in the Last Year: Not on file  . Ran Out of Food in the Last Year: Not on file  Transportation Needs:   . Lack of Transportation (Medical): Not on file  . Lack of Transportation (Non-Medical): Not on file  Physical Activity:   . Days of Exercise per Week: Not on file  . Minutes of Exercise per Session: Not on file  Stress:   . Feeling of Stress : Not on file  Social Connections:   . Frequency of Communication with Friends and Family: Not on file  . Frequency of Social Gatherings with Friends and Family: Not on file  . Attends Religious Services: Not on file  . Active Member of Clubs or Organizations: Not on file  . Attends Archivist Meetings: Not on file  . Marital Status: Not on file     Family History: The patient's family history includes Cancer in his mother; Diabetes in his mother; Hypertension in his father. ROS:   Please see the history of present illness.    All 14 point review of systems negative except as described per history of present illness  EKGs/Labs/Other Studies Reviewed:      Recent Labs: 09/20/2019: ALT 17; BUN 17; Creatinine, Ser 1.01; Hemoglobin 13.2; Platelets 243; Potassium 5.2; Sodium 140  Recent Lipid Panel No results found for: CHOL, TRIG, HDL, CHOLHDL, VLDL, LDLCALC, LDLDIRECT  Physical Exam:    VS:  BP (!) 130/96 (BP Location: Left Arm, Patient Position: Sitting, Cuff Size: Normal)   Pulse 62   Ht 6\' 2"  (1.88 m)   Wt 284 lb (128.8 kg)   SpO2 98%   BMI 36.46 kg/m     Wt Readings from Last 3 Encounters:  12/13/19 284 lb (128.8 kg)  09/20/19 279 lb (126.6 kg)  05/15/19 273 lb (123.8 kg)     GEN:  Well nourished, well developed in no acute distress HEENT: Normal NECK: No JVD; No carotid bruits LYMPHATICS: No lymphadenopathy CARDIAC: RRR, no murmurs, no rubs, no gallops RESPIRATORY:  Clear to auscultation without rales, wheezing or rhonchi  ABDOMEN: Soft, non-tender, non-distended MUSCULOSKELETAL:  No edema; No deformity    SKIN: Warm and dry LOWER EXTREMITIES: no swelling NEUROLOGIC:  Alert and oriented x 3 PSYCHIATRIC:  Normal affect   ASSESSMENT:    1. Typical atrial flutter ([place])   2. Essential hypertension   3. Ascending aortic aneurysm ([place])   4. Rheumatoid arthritis involving shoulder, unspecified laterality, unspecified whether rheumatoid factor present ([place])   5. Dyslipidemia    PLAN:    In order of problems listed above:  1. Typical atrial flutter.  He is anticoagulated which I will continue.  Anticoagulation can be withdrawn 24 to 48 hours before  surgery, I will ask him to have EKG today to confirm the rhythm.  He denies having any palpitations lately 2. Essential hypertension: Blood pressure seems to be controlled.  I will continue present management. 3. Ascending arctic aneurysm: We will do echocardiogram to assess left ventricle ejection fraction as well as look at the ascending aortic aneurysm. 4. Rheumatoid arthritis he is on Remicade.  Doing very well from that point review. 5. Dyslipidemia, he does have some intolerance to medications.  I will recheck his fasting lipid profile. 6. Cardiovascular preop evaluation.  From cardiac point of view he is doing well he is able to walk climb stairs without cardiac complaints.  He should be able to proceed with surgery with no reservations.   Medication Adjustments/Labs and Tests Ordered: Current medicines are reviewed at length with the patient today.  Concerns regarding medicines are outlined above.  No orders of the defined types were placed in this encounter.  Medication changes: No orders of the defined types were placed in this encounter.   Signed, Park Liter, MD, [facility] 12/13/2019 2:26 PM    [place]

## 2019-12-13 NOTE — Patient Instructions (Signed)
Medication Instructions:  Your physician recommends that you continue on your current medications as directed. Please refer to the Current Medication list given to you today.  *If you need a refill on your cardiac medications before your next appointment, please call your pharmacy*   Lab Work: Your physician recommends that you return for lab work today: Lipid  If you have labs (blood work) drawn today and your tests are completely normal, you will receive your results only by: Marland Kitchen MyChart Message (if you have MyChart) OR . A paper copy in the mail If you have any lab test that is abnormal or we need to change your treatment, we will call you to review the results.   Testing/Procedures: Your physician has requested that you have an echocardiogram. Echocardiography is a painless test that uses sound waves to create images of your heart. It provides your doctor with information about the size and shape of your heart and how well your heart's chambers and valves are working. This procedure takes approximately one hour. There are no restrictions for this procedure.     Follow-Up: At [facility], you and your health needs are our priority.  As part of our continuing mission to provide you with exceptional heart care, we have created designated Provider Care Teams.  These Care Teams include your primary Cardiologist (physician) and Advanced Practice Providers (APPs -  Physician Assistants and Nurse Practitioners) who all work together to provide you with the care you need, when you need it.  We recommend signing up for the patient portal called "MyChart".  Sign up information is provided on this After Visit Summary.  MyChart is used to connect with patients for Virtual Visits (Telemedicine).  Patients are able to view lab/test results, encounter notes, upcoming appointments, etc.  Non-urgent messages can be sent to your provider as well.   To learn more about what you can do with MyChart, go to  [email or web address].    Your next appointment:   6 month(s)  The format for your next appointment:   In Person  Provider:   Jenne Campus, MD   Other Instructions  Echocardiogram An echocardiogram is a procedure that uses painless sound waves (ultrasound) to produce an image of the heart. Images from an echocardiogram can provide important information about:  Signs of coronary artery disease (CAD).  Aneurysm detection. An aneurysm is a weak or damaged part of an artery wall that bulges out from the normal force of blood pumping through the body.  Heart size and shape. Changes in the size or shape of the heart can be associated with certain conditions, including heart failure, aneurysm, and CAD.  Heart muscle function.  Heart valve function.  Signs of a past heart attack.  Fluid buildup around the heart.  Thickening of the heart muscle.  A tumor or infectious growth around the heart valves. Tell a health care provider about:  Any allergies you have.  All medicines you are taking, including vitamins, herbs, eye drops, creams, and over-the-counter medicines.  Any blood disorders you have.  Any surgeries you have had.  Any medical conditions you have.  Whether you are pregnant or may be pregnant. What are the risks? Generally, this is a safe procedure. However, problems may occur, including:  Allergic reaction to dye (contrast) that may be used during the procedure. What happens before the procedure? No specific preparation is needed. You may eat and drink normally. What happens during the procedure?   An IV tube may be  inserted into one of your veins.  You may receive contrast through this tube. A contrast is an injection that improves the quality of the pictures from your heart.  A gel will be applied to your chest.  A wand-like tool (transducer) will be moved over your chest. The gel will help to transmit the sound waves from the transducer.  The  sound waves will harmlessly bounce off of your heart to allow the heart images to be captured in real-time motion. The images will be recorded on a computer. The procedure may vary among health care providers and hospitals. What happens after the procedure?  You may return to your normal, everyday life, including diet, activities, and medicines, unless your health care provider tells you not to do that. Summary  An echocardiogram is a procedure that uses painless sound waves (ultrasound) to produce an image of the heart.  Images from an echocardiogram can provide important information about the size and shape of your heart, heart muscle function, heart valve function, and fluid buildup around your heart.  You do not need to do anything to prepare before this procedure. You may eat and drink normally.  After the echocardiogram is completed, you may return to your normal, everyday life, unless your health care provider tells you not to do that. This information is not intended to replace advice given to you by your health care provider. Make sure you discuss any questions you have with your health care provider. Document Revised: 06/15/2018 Document Reviewed: 03/27/2016 Elsevier Patient Education  [place].

## 2019-12-14 DIAGNOSIS — R2689 Other abnormalities of gait and mobility: Secondary | ICD-10-CM | POA: Diagnosis not present

## 2019-12-14 DIAGNOSIS — M545 Low back pain, unspecified: Secondary | ICD-10-CM | POA: Diagnosis not present

## 2019-12-14 DIAGNOSIS — M5432 Sciatica, left side: Secondary | ICD-10-CM | POA: Diagnosis not present

## 2019-12-14 DIAGNOSIS — M62552 Muscle wasting and atrophy, not elsewhere classified, left thigh: Secondary | ICD-10-CM | POA: Diagnosis not present

## 2019-12-14 DIAGNOSIS — Z96652 Presence of left artificial knee joint: Secondary | ICD-10-CM | POA: Diagnosis not present

## 2019-12-14 LAB — LIPID PANEL
Chol/HDL Ratio: 2.6 ratio (ref 0.0–5.0)
Cholesterol, Total: 233 mg/dL — ABNORMAL HIGH (ref 100–199)
HDL: 88 mg/dL (ref >39)
LDL Chol Calc (NIH): 123 mg/dL — ABNORMAL HIGH (ref 0–99)
Triglycerides: 130 mg/dL (ref 0–149)
VLDL Cholesterol Cal: 22 mg/dL (ref 5–40)

## 2019-12-17 ENCOUNTER — Telehealth: Payer: Self-pay | Admitting: Emergency Medicine

## 2019-12-17 DIAGNOSIS — E785 Hyperlipidemia, unspecified: Secondary | ICD-10-CM

## 2019-12-17 NOTE — Telephone Encounter (Signed)
Called patient. Informed him of results and place referral for lipid clinic. No further questions.

## 2019-12-17 NOTE — Telephone Encounter (Signed)
-----   Message from Park Liter, MD sent at 12/15/2019 11:03 AM EDT ----- He is intolerant to statin, he is already on Zetia, suggest to him if he is willing to be referred to lipid clinic for management of this problem

## 2019-12-18 DIAGNOSIS — M545 Low back pain, unspecified: Secondary | ICD-10-CM | POA: Diagnosis not present

## 2019-12-18 DIAGNOSIS — M5432 Sciatica, left side: Secondary | ICD-10-CM | POA: Diagnosis not present

## 2019-12-18 DIAGNOSIS — Z96652 Presence of left artificial knee joint: Secondary | ICD-10-CM | POA: Diagnosis not present

## 2019-12-18 DIAGNOSIS — R2689 Other abnormalities of gait and mobility: Secondary | ICD-10-CM | POA: Diagnosis not present

## 2019-12-18 DIAGNOSIS — M62552 Muscle wasting and atrophy, not elsewhere classified, left thigh: Secondary | ICD-10-CM | POA: Diagnosis not present

## 2019-12-20 DIAGNOSIS — R2689 Other abnormalities of gait and mobility: Secondary | ICD-10-CM | POA: Diagnosis not present

## 2019-12-20 DIAGNOSIS — Z23 Encounter for immunization: Secondary | ICD-10-CM | POA: Diagnosis not present

## 2019-12-20 DIAGNOSIS — M62552 Muscle wasting and atrophy, not elsewhere classified, left thigh: Secondary | ICD-10-CM | POA: Diagnosis not present

## 2019-12-20 DIAGNOSIS — M545 Low back pain, unspecified: Secondary | ICD-10-CM | POA: Diagnosis not present

## 2019-12-20 DIAGNOSIS — Z96652 Presence of left artificial knee joint: Secondary | ICD-10-CM | POA: Diagnosis not present

## 2019-12-20 DIAGNOSIS — M5432 Sciatica, left side: Secondary | ICD-10-CM | POA: Diagnosis not present

## 2019-12-25 ENCOUNTER — Ambulatory Visit: Payer: Medicare Other

## 2019-12-25 NOTE — Progress Notes (Deleted)
Patient ID: Brett Rosales NEEDS                 DOB: 23-Apr-1945                    MRN: [id number]     HPI: Brett Rosales is a 74 y.o. male patient referred to lipid clinic by Dr Agustin Cree. PMH is significant for  [place] score   Current Medications:  Ezetimibe 10mg  daily  Intolerances:    LDL goal: < 100 mg/dL  Diet:   Exercise:   Family History: The patient's family history includes Cancer in his mother; Diabetes in his mother; Hypertension in his father.  Social History:   Labs:  Past Medical History:  Diagnosis Date  . Acute foreign body of ear canal 11/12/2015  . Anxiety    medication made him feel flat stopped meds 09/07/2018  . Arthritis   . Ascending aortic aneurysm ([place]) 12/23/2017  . Bilateral chronic knee pain 08/14/2019  . Depression   . Dyslipidemia 01/23/2019  . Dyspnea on exertion 12/09/2017  . Dysrhythmia 11/2017  . Essential hypertension 12/09/2017  . History of kidney stones    13 stones  . Hypertension   . 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 Rheumatoid arthritis ([place]) 12/09/2017  . 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 Sleep apnea 03/2018   Had study after episode of disrhythmia. it was inconclusive. needs follow up  . Supraventricular tachycardia ([place]) 12/09/2017  . Swelling of left lower extremity 05/17/2018  . Typical atrial flutter ([place]) 12/16/2017    Current Outpatient Medications on File Prior to Visit  Medication Sig Dispense Refill  . donepezil (ARICEPT) 5 MG tablet Take 5 mg by mouth daily.    . DULoxetine (CYMBALTA) 30 MG capsule Take 30 mg by mouth daily.    Marland Kitchen ezetimibe (ZETIA) 10 MG tablet Take 10 mg by mouth daily.    . folic acid (FOLVITE) 1 MG tablet Take 1 mg by mouth daily.    Marland Kitchen gabapentin (NEURONTIN) 300 MG capsule Take 1 capsule (300 mg total) by mouth 3 (three) times daily. 30 capsule 0  . losartan (COZAAR) 100 MG tablet Take 100 mg by mouth daily.    . methotrexate 50 MG/2ML injection Inject 25 mg into the skin  once a week.     . methylphenidate (RITALIN) 20 MG tablet Take 40 mg by mouth daily.     . metoprolol tartrate (LOPRESSOR) 25 MG tablet TAKE 1 TABLET BY MOUTH TWICE DAILY. 180 tablet 1  . rivaroxaban (XARELTO) 20 MG TABS tablet Take 1 tablet (20 mg total) by mouth daily with supper. Start 7/20 180 tablet 1   No current facility-administered medications on file prior to visit.    Allergies  Allergen Reactions  . Nsaids Other (See Comments)    "Eats hole in stomach".Marland KitchenMarland KitchenMarland KitchenIbuprofen, Meloxicam...  . Statins Other (See Comments)    Myalgias    No problem-specific Assessment & Plan notes found for this encounter.     Soniyah Mcglory Rodriguez-Guzman PharmD, BCPS, [place] [id number] 12/25/2019 9:53 AM

## 2019-12-26 DIAGNOSIS — M545 Low back pain, unspecified: Secondary | ICD-10-CM | POA: Diagnosis not present

## 2019-12-26 DIAGNOSIS — M5432 Sciatica, left side: Secondary | ICD-10-CM | POA: Diagnosis not present

## 2019-12-26 DIAGNOSIS — Z96652 Presence of left artificial knee joint: Secondary | ICD-10-CM | POA: Diagnosis not present

## 2019-12-26 DIAGNOSIS — M62552 Muscle wasting and atrophy, not elsewhere classified, left thigh: Secondary | ICD-10-CM | POA: Diagnosis not present

## 2019-12-26 DIAGNOSIS — R2689 Other abnormalities of gait and mobility: Secondary | ICD-10-CM | POA: Diagnosis not present

## 2019-12-27 DIAGNOSIS — H02831 Dermatochalasis of right upper eyelid: Secondary | ICD-10-CM | POA: Diagnosis not present

## 2019-12-27 DIAGNOSIS — H25813 Combined forms of age-related cataract, bilateral: Secondary | ICD-10-CM | POA: Diagnosis not present

## 2019-12-27 DIAGNOSIS — H18413 Arcus senilis, bilateral: Secondary | ICD-10-CM | POA: Diagnosis not present

## 2019-12-27 DIAGNOSIS — H25043 Posterior subcapsular polar age-related cataract, bilateral: Secondary | ICD-10-CM | POA: Diagnosis not present

## 2019-12-28 ENCOUNTER — Other Ambulatory Visit: Payer: Self-pay | Admitting: Cardiology

## 2020-01-04 ENCOUNTER — Other Ambulatory Visit: Payer: Medicare Other

## 2020-01-04 DIAGNOSIS — M62552 Muscle wasting and atrophy, not elsewhere classified, left thigh: Secondary | ICD-10-CM | POA: Diagnosis not present

## 2020-01-04 DIAGNOSIS — Z96652 Presence of left artificial knee joint: Secondary | ICD-10-CM | POA: Diagnosis not present

## 2020-01-04 DIAGNOSIS — M545 Low back pain, unspecified: Secondary | ICD-10-CM | POA: Diagnosis not present

## 2020-01-04 DIAGNOSIS — R2689 Other abnormalities of gait and mobility: Secondary | ICD-10-CM | POA: Diagnosis not present

## 2020-01-04 DIAGNOSIS — M5432 Sciatica, left side: Secondary | ICD-10-CM | POA: Diagnosis not present

## 2020-01-05 DIAGNOSIS — E785 Hyperlipidemia, unspecified: Secondary | ICD-10-CM | POA: Diagnosis not present

## 2020-01-05 DIAGNOSIS — I10 Essential (primary) hypertension: Secondary | ICD-10-CM | POA: Diagnosis not present

## 2020-01-12 IMAGING — RF OPERATIVE LEFT HIP WITH PELVIS
1 series · 2 of 2 positions shown · non-contrast
Comparison: None.

CLINICAL DATA: Left hip arthroplasty

EXAM:
OPERATIVE LEFT HIP (WITH PELVIS IF PERFORMED) 2 VIEWS
TECHNIQUE: Fluoroscopic spot image(s) were submitted for interpretation
post-operatively.

[Series 1: unknown protocol · 0.20mm/px · 2 of 2 slices shown]
[im 1/2]
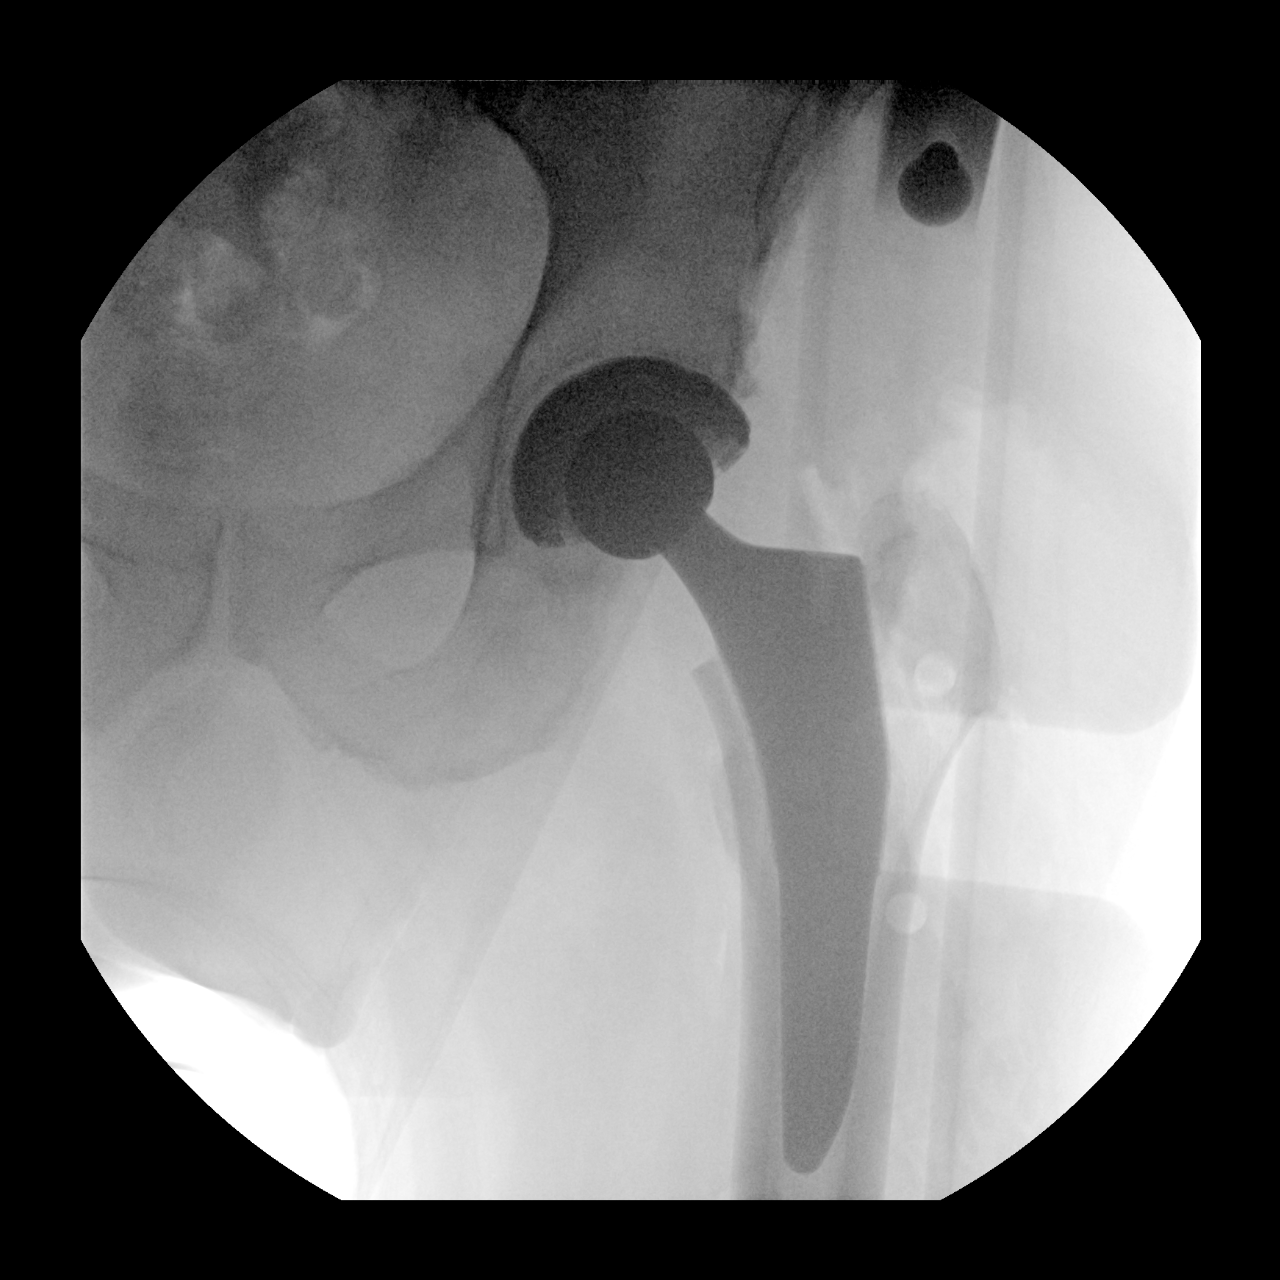
[im 2/2]
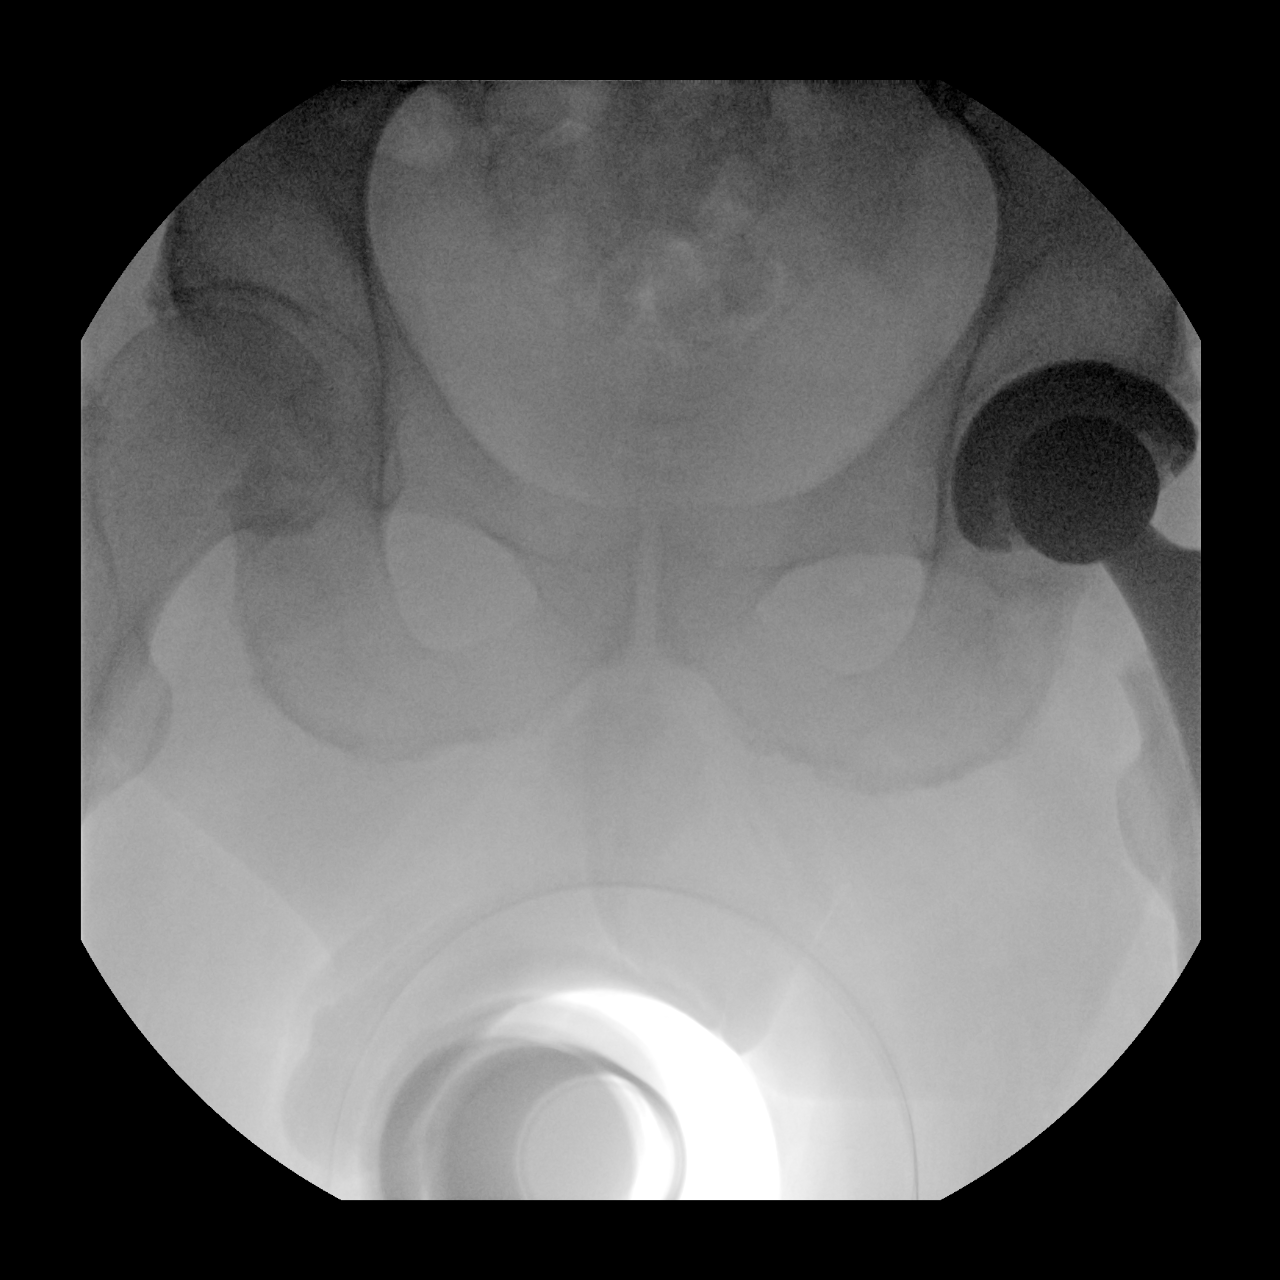

[2 of 2 positions shown; findings below may reference images not displayed]

FINDINGS: Changes of left hip replacement. Normal AP alignment. No visible
hardware or bony complicating feature.
IMPRESSION: Left hip replacement.  No visible complicating feature.

## 2020-01-14 DIAGNOSIS — M150 Primary generalized (osteo)arthritis: Secondary | ICD-10-CM | POA: Diagnosis not present

## 2020-01-14 DIAGNOSIS — Z79899 Other long term (current) drug therapy: Secondary | ICD-10-CM | POA: Diagnosis not present

## 2020-01-14 DIAGNOSIS — M2550 Pain in unspecified joint: Secondary | ICD-10-CM | POA: Diagnosis not present

## 2020-01-14 DIAGNOSIS — E669 Obesity, unspecified: Secondary | ICD-10-CM | POA: Diagnosis not present

## 2020-01-14 DIAGNOSIS — M0609 Rheumatoid arthritis without rheumatoid factor, multiple sites: Secondary | ICD-10-CM | POA: Diagnosis not present

## 2020-01-14 DIAGNOSIS — Z6837 Body mass index (BMI) 37.0-37.9, adult: Secondary | ICD-10-CM | POA: Diagnosis not present

## 2020-01-14 DIAGNOSIS — M545 Low back pain, unspecified: Secondary | ICD-10-CM | POA: Diagnosis not present

## 2020-01-18 ENCOUNTER — Ambulatory Visit: Payer: Medicare Other

## 2020-01-28 ENCOUNTER — Ambulatory Visit (INDEPENDENT_AMBULATORY_CARE_PROVIDER_SITE_OTHER): Payer: Medicare Other

## 2020-01-28 ENCOUNTER — Other Ambulatory Visit: Payer: Self-pay

## 2020-01-28 DIAGNOSIS — I483 Typical atrial flutter: Secondary | ICD-10-CM

## 2020-01-28 LAB — ECHOCARDIOGRAM COMPLETE
Area-P 1/2: 2.2 cm2
Calc EF: 44.3 %
S' Lateral: 4.1 cm
Single Plane A2C EF: 42.9 %
Single Plane A4C EF: 46 %

## 2020-01-28 NOTE — Progress Notes (Signed)
Complete echocardiogram has been performed.  Jimmy Ashlynd Michna RDCS, RVT 

## 2020-01-29 NOTE — Patient Instructions (Addendum)
DUE TO COVID-19 ONLY ONE VISITOR IS ALLOWED TO COME WITH YOU AND STAY IN THE WAITING ROOM ONLY DURING PRE OP AND PROCEDURE DAY OF SURGERY. THE 1 VISITOR  MAY VISIT WITH YOU AFTER SURGERY IN YOUR PRIVATE ROOM DURING VISITING HOURS ONLY!  YOU NEED TO HAVE A COVID 19 TEST ON_11/26______ @_11 :00______, THIS TEST MUST BE DONE BEFORE SURGERY,  COVID TESTING SITE [place] [id number], IT IS ON THE RIGHT GOING OUT [address] APPROXIMATELY  2 MINUTES PAST [employer] ON THE RIGHT. ONCE YOUR COVID TEST IS COMPLETED,  PLEASE BEGIN THE QUARANTINE INSTRUCTIONS AS OUTLINED IN YOUR HANDOUT.                HUGHIE MELROY    Your procedure is scheduled on: 02/04/20   Report to [facility] Main  Entrance   Report to Short Stay at 5:30 AM     Call this number if you have problems the morning of surgery [place], NO CHEWING GUM [place].  No food after midnight.    You may have clear liquid until 4:30 AM.    At 4:00 AM drink pre surgery drink.   Nothing by mouth after 4:30 AM.    Take these medicines the morning of surgery with A SIP OF WATER:   DO NOT TAKE ANY DIABETIC MEDICATIONS DAY OF YOUR SURGERY                               You may not have any metal on your body including hair pins and              piercings  Do not wear jewelry, make-up, lotions, powders or perfumes, deodorant             Do not wear nail polish on your fingernails.  Do not shave  48 hours prior to surgery.              Men may shave face and neck.   Do not bring valuables to the hospital. [place].  Contacts, dentures or bridgework may not be worn into surgery.  Leave suitcase in the car. After surgery it may be brought to your room.     Patients discharged the day of surgery will not be allowed to drive home. IF YOU ARE HAVING SURGERY AND GOING HOME THE  SAME DAY, YOU MUST HAVE AN ADULT TO DRIVE YOU HOME AND BE WITH YOU FOR 24 HOURS. YOU MAY GO HOME BY TAXI OR UBER OR ORTHERWISE, BUT AN ADULT MUST ACCOMPANY YOU HOME AND STAY WITH YOU FOR 24 HOURS.  Name and phone number of your driver:  Special Instructions: N/A              Please read over the following fact sheets you were given: _____________________________________________________________________             [facility] - Preparing for Surgery Before surgery, you can play an important role.  Because skin is not sterile, your skin needs to be as free of germs as possible.  You can reduce the number of germs on your skin by washing with CHG (chlorahexidine gluconate) soap before surgery.  CHG is an antiseptic  cleaner which kills germs and bonds with the skin to continue killing germs even after washing. Please DO NOT use if you have an allergy to CHG or antibacterial soaps.  If your skin becomes reddened/irritated stop using the CHG and inform your nurse when you arrive at Short Stay. Do not shave (including legs and underarms) for at least 48 hours prior to the first CHG shower.  You may shave your face/neck. Please follow these instructions carefully:  1.  Shower with CHG Soap the night before surgery and the  morning of Surgery.  2.  If you choose to wash your hair, wash your hair first as usual with your  normal  shampoo.  3.  After you shampoo, rinse your hair and body thoroughly to remove the  shampoo.                           4.  Use CHG as you would any other liquid soap.  You can apply chg directly  to the skin and wash                       Gently with a scrungie or clean washcloth.  5.  Apply the CHG Soap to your body ONLY FROM THE NECK DOWN.   Do not use on face/ open                           Wound or open sores. Avoid contact with eyes, ears mouth and genitals (private parts).                       Wash face,  Genitals (private parts) with your normal soap.             6.   Wash thoroughly, paying special attention to the area where your surgery  will be performed.  7.  Thoroughly rinse your body with warm water from the neck down.  8.  DO NOT shower/wash with your normal soap after using and rinsing off  the CHG Soap.                9.  Pat yourself dry with a clean towel.            10.  Wear clean pajamas.            11.  Place clean sheets on your bed the night of your first shower and do not  sleep with pets. Day of Surgery : Do not apply any lotions/deodorants the morning of surgery.  Please wear clean clothes to the hospital/surgery center.  FAILURE TO FOLLOW THESE INSTRUCTIONS MAY RESULT IN THE CANCELLATION OF YOUR SURGERY PATIENT SIGNATURE_________________________________  NURSE SIGNATURE__________________________________  ________________________________________________________________________   Adam Phenix  An incentive spirometer is a tool that can help keep your lungs clear and active. This tool measures how well you are filling your lungs with each breath. Taking long deep breaths may help reverse or decrease the chance of developing breathing (pulmonary) problems (especially infection) following:  A long period of time when you are unable to move or be active. BEFORE THE PROCEDURE   If the spirometer includes an indicator to show your best effort, your nurse or respiratory therapist will set it to a desired goal.  If possible, sit up straight or lean slightly forward. Try not to slouch.  Hold the incentive spirometer  in an upright position. INSTRUCTIONS FOR USE  1. Sit on the edge of your bed if possible, or sit up as far as you can in bed or on a chair. 2. Hold the incentive spirometer in an upright position. 3. Breathe out normally. 4. Place the mouthpiece in your mouth and seal your lips tightly around it. 5. Breathe in slowly and as deeply as possible, raising the piston or the ball toward the top of the column. 6. Hold  your breath for 3-5 seconds or for as long as possible. Allow the piston or ball to fall to the bottom of the column. 7. Remove the mouthpiece from your mouth and breathe out normally. 8. Rest for a few seconds and repeat Steps 1 through 7 at least 10 times every 1-2 hours when you are awake. Take your time and take a few normal breaths between deep breaths. 9. The spirometer may include an indicator to show your best effort. Use the indicator as a goal to work toward during each repetition. 10. After each set of 10 deep breaths, practice coughing to be sure your lungs are clear. If you have an incision (the cut made at the time of surgery), support your incision when coughing by placing a pillow or rolled up towels firmly against it. Once you are able to get out of bed, walk around indoors and cough well. You may stop using the incentive spirometer when instructed by your caregiver.  RISKS AND COMPLICATIONS  Take your time so you do not get dizzy or light-headed.  If you are in pain, you may need to take or ask for pain medication before doing incentive spirometry. It is harder to take a deep breath if you are having pain. AFTER USE  Rest and breathe slowly and easily.  It can be helpful to keep track of a log of your progress. Your caregiver can provide you with a simple table to help with this. If you are using the spirometer at home, follow these instructions: [place] IF:   You are having difficultly using the spirometer.  You have trouble using the spirometer as often as instructed.  Your pain medication is not giving enough relief while using the spirometer.  You develop fever of 100.5 F (38.1 C) or higher. SEEK IMMEDIATE MEDICAL CARE IF:   You cough up bloody sputum that had not been present before.  You develop fever of 102 F (38.9 C) or greater.  You develop worsening pain at or near the incision site. MAKE SURE YOU:   Understand these instructions.  Will  watch your condition.  Will get help right away if you are not doing well or get worse. Document Released: 07/05/2006 Document Revised: 05/17/2011 Document Reviewed: 09/05/2006 [facility] Patient Information 2014 [place], [place].   ________________________________________________________________________

## 2020-01-30 ENCOUNTER — Encounter (HOSPITAL_COMMUNITY)
Admission: RE | Admit: 2020-01-30 | Discharge: 2020-01-30 | Disposition: A | Discharge status: Home / Self Care | Payer: Medicare Other | Source: Ambulatory Visit | Attending: Orthopedic Surgery | Admitting: Orthopedic Surgery

## 2020-01-30 ENCOUNTER — Encounter (HOSPITAL_COMMUNITY): Payer: Self-pay

## 2020-01-30 ENCOUNTER — Other Ambulatory Visit: Payer: Self-pay

## 2020-01-30 DIAGNOSIS — Z01812 Encounter for preprocedural laboratory examination: Secondary | ICD-10-CM | POA: Insufficient documentation

## 2020-01-30 HISTORY — DX: Other complications of anesthesia, initial encounter: T88.59XA

## 2020-01-30 LAB — COMPREHENSIVE METABOLIC PANEL
ALT: 17 U/L (ref 0–44)
AST: 15 U/L (ref 15–41)
Albumin: 3.9 g/dL (ref 3.5–5.0)
Alkaline Phosphatase: 72 U/L (ref 38–126)
Anion gap: 7 (ref 5–15)
BUN: 27 mg/dL — ABNORMAL HIGH (ref 8–23)
CO2: 23 mmol/L (ref 22–32)
Calcium: 9 mg/dL (ref 8.9–10.3)
Chloride: 110 mmol/L (ref 98–111)
Creatinine, Ser: 1.09 mg/dL (ref 0.61–1.24)
GFR, Estimated: >60 mL/min (ref >60)
Glucose, Bld: 99 mg/dL (ref 70–99)
Potassium: 5.2 mmol/L — ABNORMAL HIGH (ref 3.5–5.1)
Sodium: 140 mmol/L (ref 135–145)
Total Bilirubin: 0.5 mg/dL (ref 0.3–1.2)
Total Protein: 6.9 g/dL (ref 6.5–8.1)

## 2020-01-30 LAB — CBC WITH DIFFERENTIAL/PLATELET
Abs Immature Granulocytes: 0.01 10*3/uL (ref 0.00–0.07)
Basophils Absolute: 0 10*3/uL (ref 0.0–0.1)
Basophils Relative: 1 %
Eosinophils Absolute: 0.6 10*3/uL — ABNORMAL HIGH (ref 0.0–0.5)
Eosinophils Relative: 11 %
HCT: 44.8 % (ref 39.0–52.0)
Hemoglobin: 14.1 g/dL (ref 13.0–17.0)
Immature Granulocytes: 0 %
Lymphocytes Relative: 34 %
Lymphs Abs: 1.8 10*3/uL (ref 0.7–4.0)
MCH: 30.1 pg (ref 26.0–34.0)
MCHC: 31.5 g/dL (ref 30.0–36.0)
MCV: 95.5 fL (ref 80.0–100.0)
Monocytes Absolute: 0.6 10*3/uL (ref 0.1–1.0)
Monocytes Relative: 11 %
Neutro Abs: 2.3 10*3/uL (ref 1.7–7.7)
Neutrophils Relative %: 43 %
Platelets: 247 10*3/uL (ref 150–400)
RBC: 4.69 MIL/uL (ref 4.22–5.81)
RDW: 14.1 % (ref 11.5–15.5)
WBC: 5.3 10*3/uL (ref 4.0–10.5)
nRBC: 0 % (ref 0.0–0.2)

## 2020-01-30 LAB — SURGICAL PCR SCREEN
MRSA, PCR: NEGATIVE
Staphylococcus aureus: NEGATIVE

## 2020-01-30 NOTE — Anesthesia Preprocedure Evaluation (Addendum)
Anesthesia Evaluation  Patient identified by MRN, date of birth, ID band Patient awake    Reviewed: Allergy & Precautions, NPO status , Patient's Chart, lab work & pertinent test results  Airway Mallampati: II  TM Distance: >3 FB Neck ROM: Full    Dental no notable dental hx.    Pulmonary former smoker,    Pulmonary exam normal breath sounds clear to auscultation       Cardiovascular hypertension, Pt. on medications and Pt. on home beta blockers + dysrhythmias Atrial Fibrillation  Rhythm:Irregular Rate:Normal  ECG: SB, rate 52  ECHO: 1. Left ventricular ejection fraction, by estimation, is 50 to 55%. The left ventricle has low normal function. The left ventricle has no regional wall motion abnormalities. There is moderate left ventricular hypertrophy. Left ventricular diastolic parameters are consistent with Grade I diastolic dysfunction (impaired relaxation). 2. Right ventricular systolic function is normal. The right ventricular size is mildly enlarged. There is normal pulmonary artery systolic pressure. 3. The mitral valve is normal in structure. No evidence of mitral valve regurgitation. No evidence of mitral stenosis. 4. The aortic valve is normal in structure. Aortic valve regurgitation is trivial. No aortic stenosis is present. 5. There is moderate dilatation of the ascending aorta, measuring 43 mm. 6. The inferior vena cava is normal in size with greater than 50% respiratory variability, suggesting right atrial pressure of 3 mmHg.   Neuro/Psych PSYCHIATRIC DISORDERS Anxiety Depression negative neurological ROS     GI/Hepatic negative GI ROS, Neg liver ROS,   Endo/Other  negative endocrine ROS  Renal/GU negative Renal ROS     Musculoskeletal  (+) Arthritis ,   Abdominal (+) + obese,   Peds  Hematology negative hematology ROS (+)   Anesthesia Other Findings Osteoarthritis of right knee   Reproductive/Obstetrics                          Anesthesia Physical Anesthesia Plan  ASA: III  Anesthesia Plan: Spinal and Regional   Post-op Pain Management:  Regional for Post-op pain   Induction: Intravenous  PONV Risk Score and Plan: 1 and Ondansetron, Dexamethasone, Propofol infusion and Treatment may vary due to age or medical condition  Airway Management Planned: Simple Face Mask  Additional Equipment:   Intra-op Plan:   Post-operative Plan:   Informed Consent: I have reviewed the patients History and Physical, chart, labs and discussed the procedure including the risks, benefits and alternatives for the proposed anesthesia with the patient or authorized representative who has indicated his/her understanding and acceptance.     Dental advisory given  Plan Discussed with: CRNA  Anesthesia Plan Comments: (Per cardiology 12/13/19, "Cardiovascular preop evaluation.  From cardiac point of view he is doing well he is able to walk climb stairs without cardiac complaints.  He should be able to proceed with surgery with no reservations." S/p left total knee arthroplasty 09/24/19 with no anesthesia complications noted. )       Anesthesia Quick Evaluation

## 2020-01-30 NOTE — Progress Notes (Signed)
COVID Vaccine Completed:Yes Date COVID Vaccine completed:05/17/19 COVID vaccine manufacturer:   Moderna     PCP - Dr. Aris Everts Cardiologist - Dr. Nelta Numbers  Chest x-ray - no EKG - 05/15/19-Epic Stress Test - no ECHO - 01/29/20-Epic Cardiac Cath - no Pacemaker/ICD device last checked:NA  Sleep Study - yes no results CPAP - no  Fasting Blood Sugar - NA Checks Blood Sugar _____ times a day  Blood Thinner Instructions:Xarelto/ Dr. Agustin Cree Aspirin Instructions:Stop 2 days prior/ Agustin Cree Last Dose:02/01/20  Anesthesia review:   Patient denies shortness of breath, fever, cough and chest pain at PAT appointment yes   Patient verbalized understanding of instructions that were given to them at the PAT appointment. Patient was also instructed that they will need to review over the PAT instructions again at home before surgery.yes Pt can climb 2-3 flights before SOB. He denies SOB working or with ADLs. He has anAortic aneuysm that is being watched. ECHO was done 01/29/20

## 2020-02-01 ENCOUNTER — Other Ambulatory Visit (HOSPITAL_COMMUNITY)
Admission: RE | Admit: 2020-02-01 | Discharge: 2020-02-01 | Disposition: A | Discharge status: Home / Self Care | Payer: Medicare Other | Source: Ambulatory Visit | Attending: Orthopedic Surgery | Admitting: Orthopedic Surgery

## 2020-02-01 DIAGNOSIS — Z20822 Contact with and (suspected) exposure to covid-19: Secondary | ICD-10-CM | POA: Diagnosis not present

## 2020-02-01 DIAGNOSIS — Z01812 Encounter for preprocedural laboratory examination: Secondary | ICD-10-CM | POA: Insufficient documentation

## 2020-02-01 LAB — SARS CORONAVIRUS 2 (TAT 6-24 HRS): SARS Coronavirus 2: NEGATIVE

## 2020-02-03 MED ORDER — BUPIVACAINE LIPOSOME 1.3 % IJ SUSP
20.0000 mL | Freq: Once | INTRAMUSCULAR | Status: DC
  Filled 2020-02-03: qty 20

## 2020-02-04 ENCOUNTER — Encounter (HOSPITAL_COMMUNITY)
Admission: RE | Disposition: A | Discharge status: Home / Self Care | Payer: Self-pay | Source: Home / Self Care | Attending: Orthopedic Surgery

## 2020-02-04 ENCOUNTER — Ambulatory Visit (HOSPITAL_COMMUNITY): Payer: Medicare Other | Admitting: Anesthesiology

## 2020-02-04 ENCOUNTER — Encounter (HOSPITAL_COMMUNITY): Payer: Self-pay | Admitting: Orthopedic Surgery

## 2020-02-04 ENCOUNTER — Ambulatory Visit (HOSPITAL_COMMUNITY)
Admission: RE | Admit: 2020-02-04 | Discharge: 2020-02-04 | Disposition: A | Discharge status: Home / Self Care | Payer: Medicare Other | Attending: Orthopedic Surgery | Admitting: Orthopedic Surgery

## 2020-02-04 ENCOUNTER — Ambulatory Visit (HOSPITAL_COMMUNITY): Payer: Medicare Other | Admitting: Physician Assistant

## 2020-02-04 DIAGNOSIS — Z886 Allergy status to analgesic agent status: Secondary | ICD-10-CM | POA: Diagnosis not present

## 2020-02-04 DIAGNOSIS — I471 Supraventricular tachycardia: Secondary | ICD-10-CM | POA: Diagnosis not present

## 2020-02-04 DIAGNOSIS — Z96652 Presence of left artificial knee joint: Secondary | ICD-10-CM | POA: Diagnosis not present

## 2020-02-04 DIAGNOSIS — G8918 Other acute postprocedural pain: Secondary | ICD-10-CM | POA: Diagnosis not present

## 2020-02-04 DIAGNOSIS — Z87891 Personal history of nicotine dependence: Secondary | ICD-10-CM | POA: Diagnosis not present

## 2020-02-04 DIAGNOSIS — Z79899 Other long term (current) drug therapy: Secondary | ICD-10-CM | POA: Insufficient documentation

## 2020-02-04 DIAGNOSIS — R262 Difficulty in walking, not elsewhere classified: Secondary | ICD-10-CM | POA: Insufficient documentation

## 2020-02-04 DIAGNOSIS — M6281 Muscle weakness (generalized): Secondary | ICD-10-CM | POA: Insufficient documentation

## 2020-02-04 DIAGNOSIS — Z7901 Long term (current) use of anticoagulants: Secondary | ICD-10-CM | POA: Insufficient documentation

## 2020-02-04 DIAGNOSIS — Z96642 Presence of left artificial hip joint: Secondary | ICD-10-CM | POA: Diagnosis not present

## 2020-02-04 DIAGNOSIS — Z888 Allergy status to other drugs, medicaments and biological substances status: Secondary | ICD-10-CM | POA: Diagnosis not present

## 2020-02-04 DIAGNOSIS — M1711 Unilateral primary osteoarthritis, right knee: Secondary | ICD-10-CM | POA: Insufficient documentation

## 2020-02-04 DIAGNOSIS — Z9852 Vasectomy status: Secondary | ICD-10-CM | POA: Diagnosis not present

## 2020-02-04 DIAGNOSIS — I10 Essential (primary) hypertension: Secondary | ICD-10-CM | POA: Diagnosis not present

## 2020-02-04 HISTORY — PX: TOTAL KNEE ARTHROPLASTY: SHX125

## 2020-02-04 SURGERY — ARTHROPLASTY, KNEE, TOTAL
Anesthesia: Regional | Site: Knee | Laterality: Right

## 2020-02-04 MED ORDER — METHOCARBAMOL 500 MG PO TABS
500.0000 mg | ORAL_TABLET | Freq: Four times a day (QID) | ORAL | 0 refills | Status: DC
Start: 2020-02-04 — End: 2021-01-01

## 2020-02-04 MED ORDER — 0.9 % SODIUM CHLORIDE (POUR BTL) OPTIME
TOPICAL | Status: DC | PRN
  Administered 2020-02-04: 1000 mL

## 2020-02-04 MED ORDER — TRANEXAMIC ACID-NACL 1000-0.7 MG/100ML-% IV SOLN
1000.0000 mg | INTRAVENOUS | Status: AC
  Administered 2020-02-04: 1000 mg via INTRAVENOUS
  Filled 2020-02-04: qty 100

## 2020-02-04 MED ORDER — METHOCARBAMOL 500 MG PO TABS: 500.0000 mg | ORAL_TABLET | Freq: Four times a day (QID) | ORAL | Status: DC | PRN

## 2020-02-04 MED ORDER — ONDANSETRON HCL 4 MG/2ML IJ SOLN
INTRAMUSCULAR | Status: DC | PRN
  Administered 2020-02-04: 4 mg via INTRAVENOUS

## 2020-02-04 MED ORDER — TRANEXAMIC ACID-NACL 1000-0.7 MG/100ML-% IV SOLN
INTRAVENOUS | Status: AC
  Filled 2020-02-04: qty 100

## 2020-02-04 MED ORDER — PHENYLEPHRINE HCL-NACL 10-0.9 MG/250ML-% IV SOLN
INTRAVENOUS | Status: DC | PRN
  Administered 2020-02-04: 25 ug/min via INTRAVENOUS

## 2020-02-04 MED ORDER — ACETAMINOPHEN 500 MG PO TABS
1000.0000 mg | ORAL_TABLET | Freq: Once | ORAL | Status: AC
  Administered 2020-02-04: 1000 mg via ORAL
  Filled 2020-02-04: qty 2

## 2020-02-04 MED ORDER — METHOCARBAMOL 500 MG IVPB - SIMPLE MED
INTRAVENOUS | Status: AC
  Administered 2020-02-04: 500 mg via INTRAVENOUS
  Filled 2020-02-04: qty 50

## 2020-02-04 MED ORDER — PROPOFOL 500 MG/50ML IV EMUL
INTRAVENOUS | Status: DC | PRN
  Administered 2020-02-04: 75 ug/kg/min via INTRAVENOUS

## 2020-02-04 MED ORDER — TRANEXAMIC ACID-NACL 1000-0.7 MG/100ML-% IV SOLN: 1000.0000 mg | Freq: Once | INTRAVENOUS | Status: DC

## 2020-02-04 MED ORDER — BUPIVACAINE-EPINEPHRINE 0.25% -1:200000 IJ SOLN
INTRAMUSCULAR | Status: DC | PRN
  Administered 2020-02-04: 30 mL

## 2020-02-04 MED ORDER — ORAL CARE MOUTH RINSE: 15.0000 mL | Freq: Once | OROMUCOSAL | Status: AC

## 2020-02-04 MED ORDER — DEXAMETHASONE SODIUM PHOSPHATE 10 MG/ML IJ SOLN
8.0000 mg | Freq: Once | INTRAMUSCULAR | Status: AC
  Administered 2020-02-04: 10 mg via INTRAVENOUS

## 2020-02-04 MED ORDER — BUPIVACAINE LIPOSOME 1.3 % IJ SUSP
INTRAMUSCULAR | Status: DC | PRN
  Administered 2020-02-04: 20 mL

## 2020-02-04 MED ORDER — GABAPENTIN 300 MG PO CAPS
300.0000 mg | ORAL_CAPSULE | Freq: Once | ORAL | Status: AC
  Administered 2020-02-04: 300 mg via ORAL
  Filled 2020-02-04: qty 1

## 2020-02-04 MED ORDER — CEFAZOLIN SODIUM-DEXTROSE 2-4 GM/100ML-% IV SOLN
2.0000 g | INTRAVENOUS | Status: AC
  Administered 2020-02-04: 2 g via INTRAVENOUS
  Filled 2020-02-04: qty 100

## 2020-02-04 MED ORDER — BUPIVACAINE IN DEXTROSE 0.75-8.25 % IT SOLN
INTRATHECAL | Status: DC | PRN
  Administered 2020-02-04: 1.6 mL via INTRATHECAL

## 2020-02-04 MED ORDER — DEXAMETHASONE SODIUM PHOSPHATE 10 MG/ML IJ SOLN
INTRAMUSCULAR | Status: AC
  Filled 2020-02-04: qty 1

## 2020-02-04 MED ORDER — LACTATED RINGERS IV SOLN: INTRAVENOUS | Status: DC

## 2020-02-04 MED ORDER — LACTATED RINGERS IV BOLUS
500.0000 mL | Freq: Once | INTRAVENOUS | Status: AC
  Administered 2020-02-04: 500 mL via INTRAVENOUS

## 2020-02-04 MED ORDER — PROPOFOL 10 MG/ML IV BOLUS
INTRAVENOUS | Status: AC
  Filled 2020-02-04: qty 20

## 2020-02-04 MED ORDER — PROPOFOL 10 MG/ML IV BOLUS
INTRAVENOUS | Status: DC | PRN
  Administered 2020-02-04 (×2): 10 mg via INTRAVENOUS

## 2020-02-04 MED ORDER — FENTANYL CITRATE (PF) 100 MCG/2ML IJ SOLN: 25.0000 ug | INTRAMUSCULAR | Status: DC | PRN

## 2020-02-04 MED ORDER — BUPIVACAINE-EPINEPHRINE (PF) 0.25% -1:200000 IJ SOLN
INTRAMUSCULAR | Status: AC
  Filled 2020-02-04: qty 30

## 2020-02-04 MED ORDER — SODIUM CHLORIDE 0.9% FLUSH
INTRAVENOUS | Status: DC | PRN
  Administered 2020-02-04: 20 mL

## 2020-02-04 MED ORDER — CEFAZOLIN SODIUM-DEXTROSE 2-4 GM/100ML-% IV SOLN: 2.0000 g | Freq: Four times a day (QID) | INTRAVENOUS | Status: DC

## 2020-02-04 MED ORDER — ONDANSETRON HCL 4 MG/2ML IJ SOLN: 4.0000 mg | Freq: Once | INTRAMUSCULAR | Status: DC | PRN

## 2020-02-04 MED ORDER — MIDAZOLAM HCL 2 MG/2ML IJ SOLN
INTRAMUSCULAR | Status: AC
  Filled 2020-02-04: qty 2

## 2020-02-04 MED ORDER — OXYCODONE HCL 5 MG PO TABS
5.0000 mg | ORAL_TABLET | Freq: Four times a day (QID) | ORAL | 0 refills | Status: DC | PRN
Start: 2020-02-04 — End: 2021-01-01

## 2020-02-04 MED ORDER — METHOCARBAMOL 500 MG IVPB - SIMPLE MED: 500.0000 mg | Freq: Four times a day (QID) | INTRAVENOUS | Status: DC | PRN

## 2020-02-04 MED ORDER — PHENYLEPHRINE HCL (PRESSORS) 10 MG/ML IV SOLN
INTRAVENOUS | Status: AC
  Filled 2020-02-04: qty 1

## 2020-02-04 MED ORDER — ROPIVACAINE HCL 5 MG/ML IJ SOLN
INTRAMUSCULAR | Status: DC | PRN
  Administered 2020-02-04: 30 mL via PERINEURAL

## 2020-02-04 MED ORDER — SODIUM CHLORIDE 0.9 % IR SOLN
Status: DC | PRN
  Administered 2020-02-04: 1000 mL

## 2020-02-04 MED ORDER — MIDAZOLAM HCL 5 MG/5ML IJ SOLN
INTRAMUSCULAR | Status: DC | PRN
  Administered 2020-02-04: 2 mg via INTRAVENOUS
  Administered 2020-02-04: 1 mg via INTRAVENOUS

## 2020-02-04 MED ORDER — LACTATED RINGERS IV BOLUS
250.0000 mL | Freq: Once | INTRAVENOUS | Status: AC
  Administered 2020-02-04: 250 mL via INTRAVENOUS

## 2020-02-04 MED ORDER — CHLORHEXIDINE GLUCONATE 0.12 % MT SOLN
15.0000 mL | Freq: Once | OROMUCOSAL | Status: AC
  Administered 2020-02-04: 15 mL via OROMUCOSAL

## 2020-02-04 MED ORDER — SODIUM CHLORIDE (PF) 0.9 % IJ SOLN
INTRAMUSCULAR | Status: AC
  Filled 2020-02-04: qty 20

## 2020-02-04 MED ORDER — ONDANSETRON HCL 4 MG/2ML IJ SOLN
INTRAMUSCULAR | Status: AC
  Filled 2020-02-04: qty 2

## 2020-02-04 MED ORDER — FENTANYL CITRATE (PF) 100 MCG/2ML IJ SOLN
INTRAMUSCULAR | Status: DC | PRN
  Administered 2020-02-04: 100 ug via INTRAVENOUS

## 2020-02-04 MED ORDER — WATER FOR IRRIGATION, STERILE IR SOLN
Status: DC | PRN
  Administered 2020-02-04: 2000 mL

## 2020-02-04 MED ORDER — PROPOFOL 1000 MG/100ML IV EMUL
INTRAVENOUS | Status: AC
  Filled 2020-02-04: qty 100

## 2020-02-04 MED ORDER — GABAPENTIN 300 MG PO CAPS: 300.0000 mg | ORAL_CAPSULE | Freq: Three times a day (TID) | ORAL | 0 refills | Status: DC

## 2020-02-04 MED ORDER — POVIDONE-IODINE 10 % EX SWAB
2.0000 "application " | Freq: Once | CUTANEOUS | Status: AC
  Administered 2020-02-04: 2 via TOPICAL

## 2020-02-04 MED ORDER — OXYCODONE HCL 5 MG PO TABS
ORAL_TABLET | ORAL | Status: AC
  Filled 2020-02-04: qty 2

## 2020-02-04 MED ORDER — OXYCODONE HCL 5 MG PO TABS
5.0000 mg | ORAL_TABLET | ORAL | Status: DC | PRN
  Administered 2020-02-04: 10 mg via ORAL

## 2020-02-04 MED ORDER — FENTANYL CITRATE (PF) 100 MCG/2ML IJ SOLN
INTRAMUSCULAR | Status: AC
  Filled 2020-02-04: qty 2

## 2020-02-04 SURGICAL SUPPLY — 59 items
BAG ZIPLOCK 12X15 (MISCELLANEOUS) ×2 IMPLANT
BLADE SAGITTAL 13X1.27X60 (BLADE) ×2 IMPLANT
BLADE SAW SGTL 83.5X18.5 (BLADE) ×2 IMPLANT
BLADE SURG 15 STRL LF DISP TIS (BLADE) ×1 IMPLANT
BLADE SURG 15 STRL SS (BLADE) ×1
BLADE SURG SZ10 CARB STEEL (BLADE) ×4 IMPLANT
BNDG ELASTIC 6X5.8 VLCR STR LF (GAUZE/BANDAGES/DRESSINGS) ×2 IMPLANT
BOWL SMART MIX CTS (DISPOSABLE) ×2 IMPLANT
CEMENT BONE SIMPLEX SPEEDSET (Cement) ×4 IMPLANT
CLSR STERI-STRIP ANTIMIC 1/2X4 (GAUZE/BANDAGES/DRESSINGS) ×2 IMPLANT
COVER SURGICAL LIGHT HANDLE (MISCELLANEOUS) ×2 IMPLANT
COVER WAND RF STERILE (DRAPES) IMPLANT
CUFF TOURN SGL QUICK 34 (TOURNIQUET CUFF) ×1
CUFF TRNQT CYL 34X4.125X (TOURNIQUET CUFF) ×1 IMPLANT
DECANTER SPIKE VIAL GLASS SM (MISCELLANEOUS) ×4 IMPLANT
DRAPE INCISE IOBAN 66X45 STRL (DRAPES) ×4 IMPLANT
DRAPE U-SHAPE 47X51 STRL (DRAPES) ×2 IMPLANT
DRESSING AQUACEL AG SP 3.5X10 (GAUZE/BANDAGES/DRESSINGS) ×1 IMPLANT
DRSG AQUACEL AG ADV 3.5X10 (GAUZE/BANDAGES/DRESSINGS) ×2 IMPLANT
DRSG AQUACEL AG SP 3.5X10 (GAUZE/BANDAGES/DRESSINGS) ×2
DURAPREP 26ML APPLICATOR (WOUND CARE) ×4 IMPLANT
ELECT REM PT RETURN 15FT ADLT (MISCELLANEOUS) ×2 IMPLANT
FEMUR  CMT CCR STD SZ11 R KNEE (Knees) ×1 IMPLANT
FEMUR CMT CCR STD SZ11 R KNEE (Knees) ×1 IMPLANT
FEMUR CMTD CCR STD SZ11 R KNEE (Knees) ×1 IMPLANT
GLOVE BIOGEL M STRL SZ7.5 (GLOVE) ×2 IMPLANT
GLOVE BIOGEL PI IND STRL 7.5 (GLOVE) ×1 IMPLANT
GLOVE BIOGEL PI IND STRL 8.5 (GLOVE) ×2 IMPLANT
GLOVE BIOGEL PI INDICATOR 7.5 (GLOVE) ×1
GLOVE BIOGEL PI INDICATOR 8.5 (GLOVE) ×2
GLOVE SURG ORTHO 8.0 STRL STRW (GLOVE) ×6 IMPLANT
GOWN STRL REUS W/ TWL XL LVL3 (GOWN DISPOSABLE) ×2 IMPLANT
GOWN STRL REUS W/TWL XL LVL3 (GOWN DISPOSABLE) ×2
HANDPIECE INTERPULSE COAX TIP (DISPOSABLE) ×1
HOLDER FOLEY CATH W/STRAP (MISCELLANEOUS) ×2 IMPLANT
HOOD PEEL AWAY FLYTE STAYCOOL (MISCELLANEOUS) ×6 IMPLANT
INSERT TIBIAL PLY R GH10-11X10 (Insert) ×2 IMPLANT
KIT TURNOVER KIT A (KITS) IMPLANT
MANIFOLD NEPTUNE II (INSTRUMENTS) ×2 IMPLANT
NEEDLE HYPO 22GX1.5 SAFETY (NEEDLE) ×2 IMPLANT
NS IRRIG 1000ML POUR BTL (IV SOLUTION) ×2 IMPLANT
PACK TOTAL KNEE CUSTOM (KITS) ×2 IMPLANT
PENCIL SMOKE EVACUATOR (MISCELLANEOUS) ×2 IMPLANT
PROTECTOR NERVE ULNAR (MISCELLANEOUS) ×2 IMPLANT
SET HNDPC FAN SPRY TIP SCT (DISPOSABLE) ×1 IMPLANT
STEM POLY PAT PLY 38M KNEE (Knees) ×2 IMPLANT
STEM TIBIA 5 DEG SZ G R KNEE (Knees) ×1 IMPLANT
STRIP CLOSURE SKIN 1/2X4 (GAUZE/BANDAGES/DRESSINGS) ×2 IMPLANT
SUT BONE WAX W31G (SUTURE) ×2 IMPLANT
SUT MNCRL AB 3-0 PS2 18 (SUTURE) ×2 IMPLANT
SUT STRATAFIX 0 PDS 27 VIOLET (SUTURE) ×2
SUT STRATAFIX PDS+ 0 24IN (SUTURE) ×2 IMPLANT
SUT VIC AB 1 CT1 36 (SUTURE) ×2 IMPLANT
SUTURE STRATFX 0 PDS 27 VIOLET (SUTURE) ×1 IMPLANT
SYR 30ML LL (SYRINGE) ×4 IMPLANT
TIBIA STEM 5 DEG SZ G R KNEE (Knees) ×2 IMPLANT
TRAY FOLEY MTR SLVR 16FR STAT (SET/KITS/TRAYS/PACK) ×2 IMPLANT
WATER STERILE IRR 1000ML POUR (IV SOLUTION) ×4 IMPLANT
WRAP KNEE MAXI GEL POST OP (GAUZE/BANDAGES/DRESSINGS) ×2 IMPLANT

## 2020-02-04 NOTE — H&P (Signed)
Brett Rosales MRN:  [id number] DOB/SEX:  [birth year]/male  CHIEF COMPLAINT:  Painful right Knee  HISTORY: Patient is a 74 y.o. male presented with a history of pain in the right knee. Onset of symptoms was gradual starting a few years ago with gradually worsening course since that time. Patient has been treated conservatively with over-the-counter NSAIDs and activity modification. Patient currently rates pain in the knee at 10 out of 10 with activity. There is pain at night.  PAST MEDICAL HISTORY: Patient Active Problem List   Diagnosis Date Noted  . Anxiety   . Arthritis   . Depression   . History of kidney stones   . Hypertension   . 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 Bilateral chronic knee pain 08/14/2019  . Dyslipidemia 01/23/2019  . 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 Swelling of left lower extremity 05/17/2018  . Sleep apnea 03/2018  . Ascending aortic aneurysm ([place]) 12/23/2017  . Typical atrial flutter ([place]) 12/16/2017  . Supraventricular tachycardia ([place]) 12/09/2017  . Essential hypertension 12/09/2017  . Dyspnea on exertion 12/09/2017  . Rheumatoid arthritis ([place]) 12/09/2017  . Dysrhythmia 11/2017  . Acute foreign body of ear canal 11/12/2015   Past Medical History:  Diagnosis Date  . Acute foreign body of ear canal 11/12/2015  . Anxiety    medication made him feel flat stopped meds 09/07/2018  . Arthritis   . Ascending aortic aneurysm ([place]) 12/23/2017  . Bilateral chronic knee pain 08/14/2019  . Complication of anesthesia    Knee block didn't take in 7/21 lt knee  . Depression   . Dyslipidemia 01/23/2019  . Dyspnea on exertion 12/09/2017  . Dysrhythmia 11/2017   a flutter  . Essential hypertension 12/09/2017  . History of kidney stones    13 stones  . Hypertension   . 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 Rheumatoid arthritis ([place]) 12/09/2017  . 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 Sleep apnea 03/2018    Had study after episode of disrhythmia. it was inconclusive. needs follow up  . Supraventricular tachycardia ([place]) 12/09/2017  . Swelling of left lower extremity 05/17/2018  . Typical atrial flutter ([place]) 12/16/2017   Past Surgical History:  Procedure Laterality Date  . TONSILLECTOMY AND ADENOIDECTOMY    . TOTAL HIP ARTHROPLASTY Left 09/21/2018   Procedure: TOTAL HIP ARTHROPLASTY ANTERIOR APPROACH;  Surgeon: Rod Can, MD;  Location: [facility];  Service: Orthopedics;  Laterality: Left;  . TOTAL KNEE ARTHROPLASTY Left 09/24/2019   Procedure: TOTAL KNEE ARTHROPLASTY;  Surgeon: Vickey Huger, MD;  Location: [facility];  Service: Orthopedics;  Laterality: Left;  Marland Kitchen VASECTOMY  1990     MEDICATIONS:   Medications Prior to Admission  Medication Sig Dispense Refill Last Dose  . donepezil (ARICEPT) 5 MG tablet Take 5 mg by mouth daily.   02/03/2020 at Unknown time  . DULoxetine (CYMBALTA) 30 MG capsule Take 30 mg by mouth daily.   02/04/2020 at Unknown time  . ezetimibe (ZETIA) 10 MG tablet Take 10 mg by mouth daily.   02/03/2020 at Unknown time  . folic acid (FOLVITE) 1 MG tablet Take 1 mg by mouth daily.   02/03/2020 at Unknown time  . losartan (COZAAR) 100 MG tablet Take 100 mg by mouth daily.   02/03/2020 at Unknown time  . methotrexate 50 MG/2ML injection Inject 25 mg into the skin once a week.    Past Month at Unknown time  . methylphenidate (  RITALIN) 20 MG tablet Take 40 mg by mouth daily.    02/03/2020 at Unknown time  . metoprolol tartrate (LOPRESSOR) 25 MG tablet TAKE 1 TABLET BY MOUTH TWICE DAILY. (Patient taking differently: Take 25 mg by mouth 2 (two) times daily. ) 180 tablet 3 02/04/2020 at Unknown time  . rivaroxaban (XARELTO) 20 MG TABS tablet Take 1 tablet (20 mg total) by mouth daily with supper. Start 7/20 (Patient taking differently: Take 20 mg by mouth daily with supper. ) 180 tablet 1 01/31/2020 at 2200  . gabapentin (NEURONTIN) 300 MG capsule Take 1 capsule (300 mg total) by  mouth 3 (three) times daily. (Patient not taking: Reported on 01/25/2020) 30 capsule 0 Not Taking at Unknown time    ALLERGIES:   Allergies  Allergen Reactions  . Nsaids Other (See Comments)    "Eats hole in stomach".Marland KitchenMarland KitchenMarland KitchenIbuprofen, Meloxicam...  . Statins Other (See Comments)    Myalgias    REVIEW OF SYSTEMS:  A comprehensive review of systems was negative except for: Musculoskeletal: positive for arthralgias and bone pain   FAMILY HISTORY:   Family History  Problem Relation Age of Onset  . Diabetes Mother   . Cancer Mother   . Hypertension Father     SOCIAL HISTORY:   Social History   Tobacco Use  . Smoking status: Former Smoker    Packs/day: 1.00    Years: 15.00    Pack years: 15.00    Types: Cigarettes    Quit date: 01/29/1986    Years since quitting: 34.0  . Smokeless tobacco: Never Used  Substance Use Topics  . Alcohol use: No    Alcohol/week: 0.0 standard drinks     EXAMINATION:  Vital signs in last 24 hours: Temp:  [98.5 F (36.9 C)] 98.5 F (36.9 C) (11/29 0554) Pulse Rate:  [58] 58 (11/29 0554) Resp:  [11] 11 (11/29 0554) BP: (151)/(82) 151/82 (11/29 0554) SpO2:  [98 %] 98 % (11/29 0554) Weight:  [132.5 kg] 132.5 kg (11/29 0616)  BP (!) 151/82   Pulse (!) 58   Temp 98.5 F (36.9 C) (Oral)   Resp 11   Wt 132.5 kg   SpO2 98%   BMI 37.49 kg/m   General Appearance:    Alert, cooperative, no distress, appears stated age  Head:    Normocephalic, without obvious abnormality, atraumatic  Eyes:    PERRL, conjunctiva/corneas clear, EOM's intact, fundi    benign, both eyes       Ears:    Normal TM's and external ear canals, both ears  Nose:   Nares normal, septum midline, mucosa normal, no drainage    or sinus tenderness  Throat:   Lips, mucosa, and tongue normal; teeth and gums normal  Neck:   Supple, symmetrical, trachea midline, no adenopathy;       thyroid:  No enlargement/tenderness/nodules; no carotid   bruit or JVD  Back:     Symmetric, no  curvature, ROM normal, no CVA tenderness  Lungs:     Clear to auscultation bilaterally, respirations unlabored  Chest wall:    No tenderness or deformity  Heart:    Regular rate and rhythm, S1 and S2 normal, no murmur, rub   or gallop  Abdomen:     Soft, non-tender, bowel sounds active all four quadrants,    no masses, no organomegaly  Genitalia:    Normal male without lesion, discharge or tenderness  Rectal:    Normal tone, normal prostate, no masses or tenderness;  guaiac negative stool  Extremities:   Extremities normal, atraumatic, no cyanosis or edema  Pulses:   2+ and symmetric all extremities  Skin:   Skin color, texture, turgor normal, no rashes or lesions  Lymph nodes:   Cervical, supraclavicular, and axillary nodes normal  Neurologic:   CNII-XII intact. Normal strength, sensation and reflexes      throughout    Musculoskeletal:  ROM 0-120, Ligaments intact,  Imaging Review Plain radiographs demonstrate severe degenerative joint disease of the right knee. The overall alignment is neutral. The bone quality appears to be good for age and reported activity level.  Assessment/Plan: Primary osteoarthritis, right knee   The patient history, physical examination and imaging studies are consistent with advanced degenerative joint disease of the right knee. The patient has failed conservative treatment.  The clearance notes were reviewed.  After discussion with the patient it was felt that Total Knee Replacement was indicated. The procedure,  risks, and benefits of total knee arthroplasty were presented and reviewed. The risks including but not limited to aseptic loosening, infection, blood clots, vascular injury, stiffness, patella tracking problems complications among others were discussed. The patient acknowledged the explanation, agreed to proceed with the plan.  Preoperative templating of the joint replacement has been completed, documented, and submitted to the Operating Room  personnel in order to optimize intra-operative equipment management.    Patient's anticipated LOS is less than 2 midnights, meeting these requirements: - Lives within 1 hour of care - Has a competent adult at home to recover with post-op recover - NO history of  - Chronic pain requiring opiods  - Diabetes  - Coronary Artery Disease  - Heart failure  - Heart attack  - Stroke  - DVT/VTE  - Cardiac arrhythmia  - Respiratory Failure/COPD  - Renal failure  - Anemia  - Advanced Liver disease       Donia Ast 02/04/2020, 6:59 AM

## 2020-02-04 NOTE — Progress Notes (Signed)
Orthopedic Tech Progress Note Patient Details:  Brett Rosales November 18, 1945 [id number] On cpm     Post Interventions Patient Tolerated: Well Instructions Provided: Care of device   [name] 02/04/2020, 10:44 AM

## 2020-02-04 NOTE — Progress Notes (Signed)
Orthopedic Tech Progress Note Patient Details:  Brett Rosales 08/05/1945 [id number]  Patient ID: Brett Rosales, male   DOB: [birth year], 74 y.o.   MRN: [id number]   [name] 02/04/2020, 12:03 PM cpm removed in PACU  For patient pt. @1200 

## 2020-02-04 NOTE — Evaluation (Signed)
Physical Therapy Evaluation Patient Details Name: Brett Rosales MRN: [id number] DOB: September 07, 1945 Today's Date: 02/04/2020   History of Present Illness  Patient is 74 y.o. male s/p Rt TKA on 02/04/20 with PMH significant for A-flutter, SVT, HTN, depression, anxiet, OA, RA, Lt TKA on 09/24/19, Lt THA in 2020.     Clinical Impression  SABURO LUGER is a 74 y.o. male POD 0 s/p Rt TKA. Patient reports independence with mobility at baseline. Patient is now limited by functional impairments (see PT problem list below) and requires supervision for transfers and gait with RW. Patient was able to ambulate ~100 feet with RW and supervision and cues for safe walker management. Patient instructed in exercises to facilitate ROM and circulation. Patient will benefit from continued skilled PT interventions to address impairments and progress towards PLOF. Patient has met mobility goals at adequate level for discharge home; will continue to follow if pt continues acute stay to progress towards Mod I goals.     Follow Up Recommendations Follow surgeon's recommendation for DC plan and follow-up therapies;Outpatient PT    Equipment Recommendations  None recommended by PT    Recommendations for Other Services       Precautions / Restrictions Precautions Precautions: Fall Restrictions Weight Bearing Restrictions: No Other Position/Activity Restrictions: WBAT      Mobility  Bed Mobility Overal bed mobility: Needs Assistance Bed Mobility: Supine to Sit;Sit to Supine     Supine to sit: Supervision Sit to supine: Supervision   General bed mobility comments: no assist required, pt taking extra time.     Transfers Overall transfer level: Needs assistance Equipment used: Rolling walker (2 wheeled) Transfers: Sit to/from Stand Sit to Stand: Supervision;Min guard         General transfer comment: cue for safe hand placement on RW, no assist needed or power up from EOB. cues to extend Rt LE when  sitting.  Ambulation/Gait Ambulation/Gait assistance: Min Gaffer (Feet): 100 Feet Assistive device: Rolling walker (2 wheeled) Gait Pattern/deviations: Step-to pattern;Decreased stride length;Decreased weight shift to right Gait velocity: decr   General Gait Details: cues for step pattern and RW position, no overt LOB  Stairs            Wheelchair Mobility    Modified Rankin (Stroke Patients Only)       Balance Overall balance assessment: Mild deficits observed, not formally tested                                           Pertinent Vitals/Pain Pain Assessment: 0-10 Pain Score: 3  Pain Location: Rt knee Pain Descriptors / Indicators: Aching;Discomfort Pain Intervention(s): Monitored during session;Limited activity within patient's tolerance;Premedicated before session;Repositioned    Home Living Family/patient expects to be discharged to:: Private residence Living Arrangements: Spouse/significant other Available Help at Discharge: Family;Available 24 hours/day Type of Home: House Home Access: Level entry     Home Layout: One level;Laundry or work area in [place]: Environmental consultant - 2 wheels;Bedside commode;Grab bars - tub/shower;Shower seat      Prior Function Level of Independence: Independent         Comments: pt enjoys Teacher, early years/pre Dominance   Dominant Hand: Right    Extremity/Trunk Assessment   Upper Extremity Assessment Upper Extremity Assessment: Overall WFL for tasks assessed    Lower Extremity Assessment Lower Extremity Assessment:  Overall WFL for tasks assessed;RLE deficits/detail RLE Deficits / Details: AROM 5-80 degrees, 3/5 or greater throughout, no extensor lag with SLR RLE Sensation: WNL RLE Coordination: WNL    Cervical / Trunk Assessment Cervical / Trunk Assessment: Normal  Communication   Communication: No difficulties  Cognition Arousal/Alertness:  Awake/alert Behavior During Therapy: WFL for tasks assessed/performed Overall Cognitive Status: Within Functional Limits for tasks assessed                                        General Comments      Exercises Total Joint Exercises Ankle Circles/Pumps: AROM;Both;Supine;10 reps Quad Sets: Supine;Other reps (comment);AROM;Right (3 reps) Short Arc Quad: Supine;Other reps (comment);AROM;Right (2) Heel Slides: Supine;Other reps (comment);AROM;Right (3) Hip ABduction/ADduction: Supine;Other reps (comment);AROM;Right (2) Straight Leg Raises: Supine;Other reps (comment);AROM;Right (3) Long Arc Quad: Supine;Other reps (comment);AROM;Right (1) Knee Flexion: Supine;Other reps (comment);AROM;Right (1)   Assessment/Plan    PT Assessment Patient needs continued PT services  PT Problem List Decreased strength;Decreased range of motion;Decreased activity tolerance;Decreased balance;Decreased mobility;Decreased knowledge of use of DME;Decreased knowledge of precautions       PT Treatment Interventions Gait training;DME instruction;Stair training;Functional mobility training;Therapeutic activities;Therapeutic exercise;Balance training;Patient/family education    PT Goals (Current goals can be found in the Care Plan section)  Acute Rehab PT Goals Patient Stated Goal: get walking in spring and take trip PT Goal Formulation: With patient Time For Goal Achievement: 02/11/20 Potential to Achieve Goals: Good    Frequency 7X/week   Barriers to discharge        Co-evaluation               AM-PAC PT "6 Clicks" Mobility  Outcome Measure Help needed turning from your back to your side while in a flat bed without using bedrails?: None Help needed moving from lying on your back to sitting on the side of a flat bed without using bedrails?: None Help needed moving to and from a bed to a chair (including a wheelchair)?: A Little Help needed standing up from a chair using your  arms (e.g., wheelchair or bedside chair)?: A Little Help needed to walk in hospital room?: A Little Help needed climbing 3-5 steps with a railing? : A Little 6 Click Score: 20    End of Session Equipment Utilized During Treatment: Gait belt Activity Tolerance: Patient tolerated treatment well Patient left: with call bell/phone within reach;in bed Nurse Communication: Mobility status PT Visit Diagnosis: Muscle weakness (generalized) (M62.81);Difficulty in walking, not elsewhere classified (R26.2)    Time: 1200-1229 PT Time Calculation (min) (ACUTE ONLY): 29 min   Charges:   PT Evaluation $PT Eval Low Complexity: 1 Low PT Treatments $Gait Training: 8-22 mins        Verner Mould, DPT Acute Rehabilitation Services  Office [phone] Pager [phone]  02/04/2020 1:02 PM

## 2020-02-04 NOTE — Anesthesia Procedure Notes (Signed)
Anesthesia Regional Block: Adductor canal block   Pre-Anesthetic Checklist: ,, timeout performed, Correct Patient, Correct Site, Correct Laterality, Correct Procedure,, site marked, risks and benefits discussed, Surgical consent,  Pre-op evaluation,  At surgeon's request and post-op pain management  Laterality: Right  Prep: chloraprep       Needles:  Injection technique: Single-shot  Needle Type: Echogenic Stimulator Needle     Needle Length: 10cm  Needle Gauge: 20     Additional Needles:   Procedures:,,,, ultrasound used (permanent image in chart),,,,  Narrative:  Start time: 02/04/2020 7:00 AM End time: 02/04/2020 7:10 AM Injection made incrementally with aspirations every 5 mL.  Performed by: Personally  Anesthesiologist: Murvin Natal, MD  Additional Notes: Functioning IV was confirmed and monitors were applied. A time-out was performed. Hand hygiene and sterile gloves were used. The thigh was placed in a frog-leg position and prepped in a sterile fashion. A 171mm 20ga BBraun echogenic stimulator needle was placed using ultrasound guidance.  Negative aspiration and negative test dose prior to incremental administration of local anesthetic. The patient tolerated the procedure well.

## 2020-02-04 NOTE — Progress Notes (Addendum)
Orthopedic Tech Progress Note Patient Details:  [name] 1946/01/26 [id number] Patient already has bone foam at home. CPM Right Knee CPM Right Knee: On Right Knee Flexion (Degrees): 0 Right Knee Extension (Degrees): 90  Post Interventions Patient Tolerated: Well Instructions Provided: Care of device  [name] 02/04/2020, 10:42 AM

## 2020-02-04 NOTE — Anesthesia Procedure Notes (Signed)
Procedure Name: MAC Date/Time: 02/04/2020 7:17 AM Performed by: Lissa Morales, CRNA Pre-anesthesia Checklist: Patient identified, Suction available, Emergency Drugs available, Patient being monitored and Timeout performed Patient Re-evaluated:Patient Re-evaluated prior to induction Oxygen Delivery Method: Simple face mask Placement Confirmation: positive ETCO2

## 2020-02-04 NOTE — Anesthesia Procedure Notes (Signed)
Spinal  Patient location during procedure: OR Start time: 02/04/2020 7:20 AM End time: 02/04/2020 7:30 AM Staffing Performed: anesthesiologist  Anesthesiologist: Murvin Natal, MD Preanesthetic Checklist Completed: patient identified, IV checked, risks and benefits discussed, surgical consent, monitors and equipment checked, pre-op evaluation and timeout performed Spinal Block Patient position: sitting Prep: DuraPrep Patient monitoring: cardiac monitor, continuous pulse ox and blood pressure Approach: midline Location: L3-4 Injection technique: single-shot Needle Needle type: Pencan  Needle gauge: 24 G Needle length: 12.7 cm Assessment Sensory level: T10 Additional Notes Functioning IV was confirmed and monitors were applied. Sterile prep and drape, including hand hygiene and sterile gloves were used. The patient was positioned and the spine was prepped. The skin was anesthetized with lidocaine.  Free flow of clear CSF was obtained prior to injecting local anesthetic into the CSF.  The spinal needle aspirated freely following injection.  The needle was carefully withdrawn.  The patient tolerated the procedure well.

## 2020-02-04 NOTE — Anesthesia Postprocedure Evaluation (Signed)
Anesthesia Post Note  Patient: JEFFREY GRAEFE  Procedure(s) Performed: TOTAL KNEE ARTHROPLASTY (Right Knee)     Patient location during evaluation: PACU Anesthesia Type: Regional and Spinal Level of consciousness: oriented and awake and alert Pain management: pain level controlled Vital Signs Assessment: post-procedure vital signs reviewed and stable Respiratory status: spontaneous breathing, respiratory function stable and patient connected to nasal cannula oxygen Cardiovascular status: blood pressure returned to baseline and stable Postop Assessment: no headache, no backache and no apparent nausea or vomiting Anesthetic complications: no   No complications documented.  Last Vitals:  Vitals:   02/04/20 1100 02/04/20 1245  BP: (!) 158/96 (!) 152/78  Pulse: (!) 50 62  Resp:  16  Temp:    SpO2: 97% 99%    Last Pain:  Vitals:   02/04/20 1245  TempSrc:   PainSc: 3                  Emmah Bratcher P Oziah Vitanza

## 2020-02-04 NOTE — Progress Notes (Signed)
Orthopedic Tech Progress Note Patient Details:  Brett Rosales 1946/02/11 [id number]  Patient ID: Brett Rosales, male   DOB: [birth year], 74 y.o.   MRN: [id number]   [name] 02/04/2020, 10:44 AM

## 2020-02-04 NOTE — Op Note (Signed)
TOTAL KNEE REPLACEMENT OPERATIVE NOTE:  02/04/2020  9:06 AM  PATIENT:  Brett Rosales  74 y.o. male  PRE-OPERATIVE DIAGNOSIS:  Osteoarthritis of right knee M17.11  POST-OPERATIVE DIAGNOSIS:  Osteoarthritis of right knee M17.11  PROCEDURE:  Procedure(s): TOTAL KNEE ARTHROPLASTY  SURGEON:  Surgeon(s): Vickey Huger, MD  PHYSICIAN ASSISTANT: Carlyon Shadow, PA-C C}  ANESTHESIA:   spinal  SPECIMEN: None  COUNTS:  Correct  TOURNIQUET:   Total Tourniquet Time Documented: Thigh (Right) - 40 minutes Total: Thigh (Right) - 40 minutes   DICTATION:  Indication for procedure:    The patient is a 74 y.o. male who has failed conservative treatment for Osteoarthritis of right knee M17.11.  Informed consent was obtained prior to anesthesia. The risks versus benefits of the operation were explain and in a way the patient can, and did, understand.    Description of procedure:     The patient was taken to the operating room and placed under anesthesia.  The patient was positioned in the usual fashion taking care that all body parts were adequately padded and/or protected.  A tourniquet was applied and the leg prepped and draped in the usual sterile fashion.  The extremity was exsanguinated with the esmarch and tourniquet inflated to 350 mmHg.  Pre-operative range of motion was normal.    A midline incision approximately 6-7 inches long was made with a #10 blade.  A new blade was used to make a parapatellar arthrotomy going 2-3 cm into the quadriceps tendon, over the patella, and alongside the medial aspect of the patellar tendon.  A synovectomy was then performed with the #10 blade and forceps. I then elevated the deep MCL off the medial tibial metaphysis subperiosteally around to the semimembranosus attachment.    I everted the patella and used calipers to measure patellar thickness.  I used the reamer to ream down to appropriate thickness to recreate the native thickness.  I then removed  excess bone with the rongeur and sagittal saw.  I used the appropriately sized template and drilled the three lug holes.  I then put the trial in place and measured the thickness with the calipers to ensure recreation of the native thickness.  The trial was then removed and the patella subluxed and the knee brought into flexion.  A homan retractor was place to retract and protect the patella and lateral structures.  A Z-retractor was place medially to protect the medial structures.  The extra-medullary alignment system was used to make cut the tibial articular surface perpendicular to the anamotic axis of the tibia and in 3 degrees of posterior slope.  The cut surface and alignment jig was removed.  I then used the intramedullary alignment guide to make a  valgus cut on the distal femur.  I then marked out the epicondylar axis on the distal femur.    I then used the anterior referencing sizer and measured the femur to be a size 11.  The 4-In-1 cutting block was screwed into place in external rotation matching the posterior condylar angle, making our cuts perpendicular to the epicondylar axis.  Anterior, posterior and chamfer cuts were made with the sagittal saw.  The cutting block and cut pieces were removed.  A lamina spreader was placed in 90 degrees of flexion.  The ACL, PCL, menisci, and posterior condylar osteophytes were removed.  A 10 mm spacer blocked was found to offer good flexion and extension gap balance after minimal in degree releasing.   The scoop retractor  was then placed and the femoral finishing block was pinned in place.  The small sagittal saw was used as well as the lug drill to finish the femur.  The block and cut surfaces were removed and the medullary canal hole filled with autograft bone from the cut pieces.  The tibia was delivered forward in deep flexion and external rotation.  A size G tray was selected and pinned into place centered on the medial 1/3 of the tibial tubercle.  The  reamer and keel was used to prepare the tibia through the tray.    I then trialed with the size 11 femur, size G tibia, a 10 mm insert and the 38 patella.  I had excellent flexion/extension gap balance, excellent patella tracking.  Flexion was full and beyond 120 degrees; extension was zero.  These components were chosen and the staff opened them to me on the back table while the knee was lavaged copiously and the cement mixed.  The soft tissue was infiltrated with 60cc of exparel 1.3% through a 21 gauge needle.  I cemented in the components and removed all excess cement.  The polyethylene tibial component was snapped into place and the knee placed in extension while cement was hardening.  The capsule was infilltrated with a 60cc exparel/marcaine/saline mixture.   Once the cement was hard, the tourniquet was let down.  Hemostasis was obtained.  The arthrotomy was closed using a #1 stratofix running suture.  The deep soft tissues were closed with #0 vicryls and the subcuticular layer closed with #2-0 vicryl.  The skin was reapproximated and closed with 3.0 Monocryl.  The wound was covered with steristrips, aquacel dressing, and a TED stocking.   The patient was then awakened, extubated, and taken to the recovery room in stable condition.  BLOOD LOSS:  897VN COMPLICATIONS:  None.  PLAN OF CARE: Discharge to home after PACU  PATIENT DISPOSITION:  PACU - hemodynamically stable.    Please fax a copy of this op note to my office at [phone] (please only include page 1 and 2 of the Case Information op note)

## 2020-02-04 NOTE — Transfer of Care (Signed)
Immediate Anesthesia Transfer of Care Note  Patient: ATILANO COVELLI  Procedure(s) Performed: TOTAL KNEE ARTHROPLASTY (Right Knee)  Patient Location: PACU  Anesthesia Type:Spinal  Level of Consciousness: awake, alert , oriented and patient cooperative  Airway & Oxygen Therapy: Patient Spontanous Breathing and Patient connected to face mask oxygen  Post-op Assessment: Report given to RN and Post -op Vital signs reviewed and stable  Post vital signs: stable  Last Vitals:  Vitals Value Taken Time  BP 117/64 02/04/20 0900  Temp    Pulse 47 02/04/20 0903  Resp 14 02/04/20 0903  SpO2 100 % 02/04/20 0903  Vitals shown include unvalidated device data.  Last Pain:  Vitals:   02/04/20 0616  TempSrc:   PainSc: 0-No pain         Complications: No complications documented.

## 2020-02-05 ENCOUNTER — Encounter (HOSPITAL_COMMUNITY): Payer: Self-pay | Admitting: Orthopedic Surgery

## 2020-02-05 DIAGNOSIS — E785 Hyperlipidemia, unspecified: Secondary | ICD-10-CM | POA: Diagnosis not present

## 2020-02-05 DIAGNOSIS — I10 Essential (primary) hypertension: Secondary | ICD-10-CM | POA: Diagnosis not present

## 2020-02-11 DIAGNOSIS — M25461 Effusion, right knee: Secondary | ICD-10-CM | POA: Diagnosis not present

## 2020-02-11 DIAGNOSIS — R2689 Other abnormalities of gait and mobility: Secondary | ICD-10-CM | POA: Diagnosis not present

## 2020-02-11 DIAGNOSIS — M25561 Pain in right knee: Secondary | ICD-10-CM | POA: Diagnosis not present

## 2020-02-11 DIAGNOSIS — M62551 Muscle wasting and atrophy, not elsewhere classified, right thigh: Secondary | ICD-10-CM | POA: Diagnosis not present

## 2020-02-11 DIAGNOSIS — Z96651 Presence of right artificial knee joint: Secondary | ICD-10-CM | POA: Diagnosis not present

## 2020-02-13 DIAGNOSIS — R2689 Other abnormalities of gait and mobility: Secondary | ICD-10-CM | POA: Diagnosis not present

## 2020-02-13 DIAGNOSIS — Z96651 Presence of right artificial knee joint: Secondary | ICD-10-CM | POA: Diagnosis not present

## 2020-02-13 DIAGNOSIS — M25461 Effusion, right knee: Secondary | ICD-10-CM | POA: Diagnosis not present

## 2020-02-13 DIAGNOSIS — M62551 Muscle wasting and atrophy, not elsewhere classified, right thigh: Secondary | ICD-10-CM | POA: Diagnosis not present

## 2020-02-13 DIAGNOSIS — M25561 Pain in right knee: Secondary | ICD-10-CM | POA: Diagnosis not present

## 2020-02-14 DIAGNOSIS — M25461 Effusion, right knee: Secondary | ICD-10-CM | POA: Diagnosis not present

## 2020-02-14 DIAGNOSIS — Z96651 Presence of right artificial knee joint: Secondary | ICD-10-CM | POA: Diagnosis not present

## 2020-02-14 DIAGNOSIS — M25561 Pain in right knee: Secondary | ICD-10-CM | POA: Diagnosis not present

## 2020-02-14 DIAGNOSIS — R600 Localized edema: Secondary | ICD-10-CM | POA: Diagnosis not present

## 2020-02-14 DIAGNOSIS — Z471 Aftercare following joint replacement surgery: Secondary | ICD-10-CM | POA: Diagnosis not present

## 2020-02-15 DIAGNOSIS — Z96651 Presence of right artificial knee joint: Secondary | ICD-10-CM | POA: Diagnosis not present

## 2020-02-15 DIAGNOSIS — M62551 Muscle wasting and atrophy, not elsewhere classified, right thigh: Secondary | ICD-10-CM | POA: Diagnosis not present

## 2020-02-15 DIAGNOSIS — M25461 Effusion, right knee: Secondary | ICD-10-CM | POA: Diagnosis not present

## 2020-02-15 DIAGNOSIS — M25561 Pain in right knee: Secondary | ICD-10-CM | POA: Diagnosis not present

## 2020-02-15 DIAGNOSIS — R2689 Other abnormalities of gait and mobility: Secondary | ICD-10-CM | POA: Diagnosis not present

## 2020-02-18 DIAGNOSIS — R2689 Other abnormalities of gait and mobility: Secondary | ICD-10-CM | POA: Diagnosis not present

## 2020-02-18 DIAGNOSIS — M25561 Pain in right knee: Secondary | ICD-10-CM | POA: Diagnosis not present

## 2020-02-18 DIAGNOSIS — Z96651 Presence of right artificial knee joint: Secondary | ICD-10-CM | POA: Diagnosis not present

## 2020-02-18 DIAGNOSIS — M25461 Effusion, right knee: Secondary | ICD-10-CM | POA: Diagnosis not present

## 2020-02-18 DIAGNOSIS — M62551 Muscle wasting and atrophy, not elsewhere classified, right thigh: Secondary | ICD-10-CM | POA: Diagnosis not present

## 2020-02-19 DIAGNOSIS — M0609 Rheumatoid arthritis without rheumatoid factor, multiple sites: Secondary | ICD-10-CM | POA: Diagnosis not present

## 2020-02-19 DIAGNOSIS — Z79899 Other long term (current) drug therapy: Secondary | ICD-10-CM | POA: Diagnosis not present

## 2020-02-19 DIAGNOSIS — R5383 Other fatigue: Secondary | ICD-10-CM | POA: Diagnosis not present

## 2020-02-19 DIAGNOSIS — Z111 Encounter for screening for respiratory tuberculosis: Secondary | ICD-10-CM | POA: Diagnosis not present

## 2020-02-20 DIAGNOSIS — R2689 Other abnormalities of gait and mobility: Secondary | ICD-10-CM | POA: Diagnosis not present

## 2020-02-20 DIAGNOSIS — M25561 Pain in right knee: Secondary | ICD-10-CM | POA: Diagnosis not present

## 2020-02-20 DIAGNOSIS — M25461 Effusion, right knee: Secondary | ICD-10-CM | POA: Diagnosis not present

## 2020-02-20 DIAGNOSIS — M62551 Muscle wasting and atrophy, not elsewhere classified, right thigh: Secondary | ICD-10-CM | POA: Diagnosis not present

## 2020-02-20 DIAGNOSIS — Z96651 Presence of right artificial knee joint: Secondary | ICD-10-CM | POA: Diagnosis not present

## 2020-02-22 DIAGNOSIS — R2689 Other abnormalities of gait and mobility: Secondary | ICD-10-CM | POA: Diagnosis not present

## 2020-02-22 DIAGNOSIS — M62551 Muscle wasting and atrophy, not elsewhere classified, right thigh: Secondary | ICD-10-CM | POA: Diagnosis not present

## 2020-02-22 DIAGNOSIS — M25461 Effusion, right knee: Secondary | ICD-10-CM | POA: Diagnosis not present

## 2020-02-22 DIAGNOSIS — Z96651 Presence of right artificial knee joint: Secondary | ICD-10-CM | POA: Diagnosis not present

## 2020-02-22 DIAGNOSIS — M25561 Pain in right knee: Secondary | ICD-10-CM | POA: Diagnosis not present

## 2020-03-03 DIAGNOSIS — M62551 Muscle wasting and atrophy, not elsewhere classified, right thigh: Secondary | ICD-10-CM | POA: Diagnosis not present

## 2020-03-03 DIAGNOSIS — M25461 Effusion, right knee: Secondary | ICD-10-CM | POA: Diagnosis not present

## 2020-03-03 DIAGNOSIS — R2689 Other abnormalities of gait and mobility: Secondary | ICD-10-CM | POA: Diagnosis not present

## 2020-03-03 DIAGNOSIS — Z96651 Presence of right artificial knee joint: Secondary | ICD-10-CM | POA: Diagnosis not present

## 2020-03-03 DIAGNOSIS — M25561 Pain in right knee: Secondary | ICD-10-CM | POA: Diagnosis not present

## 2020-03-05 DIAGNOSIS — Z96651 Presence of right artificial knee joint: Secondary | ICD-10-CM | POA: Diagnosis not present

## 2020-03-05 DIAGNOSIS — M25461 Effusion, right knee: Secondary | ICD-10-CM | POA: Diagnosis not present

## 2020-03-05 DIAGNOSIS — R2689 Other abnormalities of gait and mobility: Secondary | ICD-10-CM | POA: Diagnosis not present

## 2020-03-05 DIAGNOSIS — M62551 Muscle wasting and atrophy, not elsewhere classified, right thigh: Secondary | ICD-10-CM | POA: Diagnosis not present

## 2020-03-05 DIAGNOSIS — M25561 Pain in right knee: Secondary | ICD-10-CM | POA: Diagnosis not present

## 2020-03-06 DIAGNOSIS — E7800 Pure hypercholesterolemia, unspecified: Secondary | ICD-10-CM | POA: Diagnosis not present

## 2020-03-06 DIAGNOSIS — Z23 Encounter for immunization: Secondary | ICD-10-CM | POA: Diagnosis not present

## 2020-03-06 DIAGNOSIS — R000 Tachycardia, unspecified: Secondary | ICD-10-CM | POA: Diagnosis not present

## 2020-03-06 DIAGNOSIS — I10 Essential (primary) hypertension: Secondary | ICD-10-CM | POA: Diagnosis not present

## 2020-03-10 DIAGNOSIS — M25561 Pain in right knee: Secondary | ICD-10-CM | POA: Diagnosis not present

## 2020-03-10 DIAGNOSIS — R2689 Other abnormalities of gait and mobility: Secondary | ICD-10-CM | POA: Diagnosis not present

## 2020-03-10 DIAGNOSIS — Z96651 Presence of right artificial knee joint: Secondary | ICD-10-CM | POA: Diagnosis not present

## 2020-03-10 DIAGNOSIS — M62551 Muscle wasting and atrophy, not elsewhere classified, right thigh: Secondary | ICD-10-CM | POA: Diagnosis not present

## 2020-03-10 DIAGNOSIS — M25461 Effusion, right knee: Secondary | ICD-10-CM | POA: Diagnosis not present

## 2020-03-13 DIAGNOSIS — R2689 Other abnormalities of gait and mobility: Secondary | ICD-10-CM | POA: Diagnosis not present

## 2020-03-13 DIAGNOSIS — Z96651 Presence of right artificial knee joint: Secondary | ICD-10-CM | POA: Diagnosis not present

## 2020-03-13 DIAGNOSIS — M25461 Effusion, right knee: Secondary | ICD-10-CM | POA: Diagnosis not present

## 2020-03-13 DIAGNOSIS — M25561 Pain in right knee: Secondary | ICD-10-CM | POA: Diagnosis not present

## 2020-03-13 DIAGNOSIS — M62551 Muscle wasting and atrophy, not elsewhere classified, right thigh: Secondary | ICD-10-CM | POA: Diagnosis not present

## 2020-03-17 DIAGNOSIS — M25561 Pain in right knee: Secondary | ICD-10-CM | POA: Diagnosis not present

## 2020-03-17 DIAGNOSIS — R2689 Other abnormalities of gait and mobility: Secondary | ICD-10-CM | POA: Diagnosis not present

## 2020-03-17 DIAGNOSIS — M25461 Effusion, right knee: Secondary | ICD-10-CM | POA: Diagnosis not present

## 2020-03-17 DIAGNOSIS — M62551 Muscle wasting and atrophy, not elsewhere classified, right thigh: Secondary | ICD-10-CM | POA: Diagnosis not present

## 2020-03-17 DIAGNOSIS — Z96651 Presence of right artificial knee joint: Secondary | ICD-10-CM | POA: Diagnosis not present

## 2020-03-21 DIAGNOSIS — R2689 Other abnormalities of gait and mobility: Secondary | ICD-10-CM | POA: Diagnosis not present

## 2020-03-21 DIAGNOSIS — M25461 Effusion, right knee: Secondary | ICD-10-CM | POA: Diagnosis not present

## 2020-03-21 DIAGNOSIS — M62551 Muscle wasting and atrophy, not elsewhere classified, right thigh: Secondary | ICD-10-CM | POA: Diagnosis not present

## 2020-03-21 DIAGNOSIS — M25561 Pain in right knee: Secondary | ICD-10-CM | POA: Diagnosis not present

## 2020-03-21 DIAGNOSIS — Z96651 Presence of right artificial knee joint: Secondary | ICD-10-CM | POA: Diagnosis not present

## 2020-03-25 DIAGNOSIS — M25461 Effusion, right knee: Secondary | ICD-10-CM | POA: Diagnosis not present

## 2020-03-25 DIAGNOSIS — M62551 Muscle wasting and atrophy, not elsewhere classified, right thigh: Secondary | ICD-10-CM | POA: Diagnosis not present

## 2020-03-25 DIAGNOSIS — Z96651 Presence of right artificial knee joint: Secondary | ICD-10-CM | POA: Diagnosis not present

## 2020-03-25 DIAGNOSIS — M25561 Pain in right knee: Secondary | ICD-10-CM | POA: Diagnosis not present

## 2020-03-25 DIAGNOSIS — R2689 Other abnormalities of gait and mobility: Secondary | ICD-10-CM | POA: Diagnosis not present

## 2020-03-28 DIAGNOSIS — M62551 Muscle wasting and atrophy, not elsewhere classified, right thigh: Secondary | ICD-10-CM | POA: Diagnosis not present

## 2020-03-28 DIAGNOSIS — Z96651 Presence of right artificial knee joint: Secondary | ICD-10-CM | POA: Diagnosis not present

## 2020-03-28 DIAGNOSIS — R2689 Other abnormalities of gait and mobility: Secondary | ICD-10-CM | POA: Diagnosis not present

## 2020-03-28 DIAGNOSIS — M25561 Pain in right knee: Secondary | ICD-10-CM | POA: Diagnosis not present

## 2020-03-28 DIAGNOSIS — M25461 Effusion, right knee: Secondary | ICD-10-CM | POA: Diagnosis not present

## 2020-04-01 DIAGNOSIS — M25461 Effusion, right knee: Secondary | ICD-10-CM | POA: Diagnosis not present

## 2020-04-01 DIAGNOSIS — M62551 Muscle wasting and atrophy, not elsewhere classified, right thigh: Secondary | ICD-10-CM | POA: Diagnosis not present

## 2020-04-01 DIAGNOSIS — R2689 Other abnormalities of gait and mobility: Secondary | ICD-10-CM | POA: Diagnosis not present

## 2020-04-01 DIAGNOSIS — Z79899 Other long term (current) drug therapy: Secondary | ICD-10-CM | POA: Diagnosis not present

## 2020-04-01 DIAGNOSIS — M0609 Rheumatoid arthritis without rheumatoid factor, multiple sites: Secondary | ICD-10-CM | POA: Diagnosis not present

## 2020-04-01 DIAGNOSIS — M25561 Pain in right knee: Secondary | ICD-10-CM | POA: Diagnosis not present

## 2020-04-01 DIAGNOSIS — Z96651 Presence of right artificial knee joint: Secondary | ICD-10-CM | POA: Diagnosis not present

## 2020-04-03 DIAGNOSIS — M62551 Muscle wasting and atrophy, not elsewhere classified, right thigh: Secondary | ICD-10-CM | POA: Diagnosis not present

## 2020-04-03 DIAGNOSIS — M25461 Effusion, right knee: Secondary | ICD-10-CM | POA: Diagnosis not present

## 2020-04-03 DIAGNOSIS — M25561 Pain in right knee: Secondary | ICD-10-CM | POA: Diagnosis not present

## 2020-04-03 DIAGNOSIS — Z96651 Presence of right artificial knee joint: Secondary | ICD-10-CM | POA: Diagnosis not present

## 2020-04-03 DIAGNOSIS — R2689 Other abnormalities of gait and mobility: Secondary | ICD-10-CM | POA: Diagnosis not present

## 2020-04-16 DIAGNOSIS — M62551 Muscle wasting and atrophy, not elsewhere classified, right thigh: Secondary | ICD-10-CM | POA: Diagnosis not present

## 2020-04-16 DIAGNOSIS — M25561 Pain in right knee: Secondary | ICD-10-CM | POA: Diagnosis not present

## 2020-04-16 DIAGNOSIS — Z96651 Presence of right artificial knee joint: Secondary | ICD-10-CM | POA: Diagnosis not present

## 2020-04-16 DIAGNOSIS — R2689 Other abnormalities of gait and mobility: Secondary | ICD-10-CM | POA: Diagnosis not present

## 2020-04-16 DIAGNOSIS — M25461 Effusion, right knee: Secondary | ICD-10-CM | POA: Diagnosis not present

## 2020-05-05 DIAGNOSIS — E785 Hyperlipidemia, unspecified: Secondary | ICD-10-CM | POA: Diagnosis not present

## 2020-05-05 DIAGNOSIS — I10 Essential (primary) hypertension: Secondary | ICD-10-CM | POA: Diagnosis not present

## 2020-05-13 DIAGNOSIS — Z6837 Body mass index (BMI) 37.0-37.9, adult: Secondary | ICD-10-CM | POA: Diagnosis not present

## 2020-05-13 DIAGNOSIS — E669 Obesity, unspecified: Secondary | ICD-10-CM | POA: Diagnosis not present

## 2020-05-13 DIAGNOSIS — M0609 Rheumatoid arthritis without rheumatoid factor, multiple sites: Secondary | ICD-10-CM | POA: Diagnosis not present

## 2020-05-13 DIAGNOSIS — M150 Primary generalized (osteo)arthritis: Secondary | ICD-10-CM | POA: Diagnosis not present

## 2020-05-13 DIAGNOSIS — Z79899 Other long term (current) drug therapy: Secondary | ICD-10-CM | POA: Diagnosis not present

## 2020-05-13 DIAGNOSIS — M545 Low back pain, unspecified: Secondary | ICD-10-CM | POA: Diagnosis not present

## 2020-05-13 DIAGNOSIS — M2550 Pain in unspecified joint: Secondary | ICD-10-CM | POA: Diagnosis not present

## 2020-05-14 DIAGNOSIS — M0609 Rheumatoid arthritis without rheumatoid factor, multiple sites: Secondary | ICD-10-CM | POA: Diagnosis not present

## 2020-05-22 DIAGNOSIS — R413 Other amnesia: Secondary | ICD-10-CM | POA: Diagnosis not present

## 2020-05-22 DIAGNOSIS — Z79899 Other long term (current) drug therapy: Secondary | ICD-10-CM | POA: Diagnosis not present

## 2020-05-22 DIAGNOSIS — K4090 Unilateral inguinal hernia, without obstruction or gangrene, not specified as recurrent: Secondary | ICD-10-CM | POA: Diagnosis not present

## 2020-05-22 DIAGNOSIS — I10 Essential (primary) hypertension: Secondary | ICD-10-CM | POA: Diagnosis not present

## 2020-05-22 DIAGNOSIS — E8881 Metabolic syndrome: Secondary | ICD-10-CM | POA: Diagnosis not present

## 2020-05-22 DIAGNOSIS — E785 Hyperlipidemia, unspecified: Secondary | ICD-10-CM | POA: Diagnosis not present

## 2020-05-22 DIAGNOSIS — Z6839 Body mass index (BMI) 39.0-39.9, adult: Secondary | ICD-10-CM | POA: Diagnosis not present

## 2020-05-29 DIAGNOSIS — D485 Neoplasm of uncertain behavior of skin: Secondary | ICD-10-CM | POA: Diagnosis not present

## 2020-05-29 DIAGNOSIS — D044 Carcinoma in situ of skin of scalp and neck: Secondary | ICD-10-CM | POA: Diagnosis not present

## 2020-05-29 DIAGNOSIS — L821 Other seborrheic keratosis: Secondary | ICD-10-CM | POA: Diagnosis not present

## 2020-05-29 DIAGNOSIS — L820 Inflamed seborrheic keratosis: Secondary | ICD-10-CM | POA: Diagnosis not present

## 2020-06-03 DIAGNOSIS — Z6839 Body mass index (BMI) 39.0-39.9, adult: Secondary | ICD-10-CM

## 2020-06-03 DIAGNOSIS — R1031 Right lower quadrant pain: Secondary | ICD-10-CM

## 2020-06-03 HISTORY — DX: Body mass index (BMI) 39.0-39.9, adult: Z68.39

## 2020-06-03 HISTORY — DX: Right lower quadrant pain: R10.31

## 2020-06-04 DIAGNOSIS — I10 Essential (primary) hypertension: Secondary | ICD-10-CM | POA: Diagnosis not present

## 2020-06-04 DIAGNOSIS — E785 Hyperlipidemia, unspecified: Secondary | ICD-10-CM | POA: Diagnosis not present

## 2020-06-17 DIAGNOSIS — D044 Carcinoma in situ of skin of scalp and neck: Secondary | ICD-10-CM | POA: Diagnosis not present

## 2020-06-23 ENCOUNTER — Other Ambulatory Visit: Payer: Self-pay

## 2020-06-23 DIAGNOSIS — T8859XA Other complications of anesthesia, initial encounter: Secondary | ICD-10-CM | POA: Insufficient documentation

## 2020-06-24 DIAGNOSIS — L02213 Cutaneous abscess of chest wall: Secondary | ICD-10-CM | POA: Diagnosis not present

## 2020-06-26 ENCOUNTER — Ambulatory Visit: Payer: Medicare Other | Admitting: Cardiology

## 2020-07-10 IMAGING — CT CT HEART SCORING
2 series · 16 of 20 positions shown, 18 images · non-contrast
Comparison: None.

Addendum:
CLINICAL DATA: Risk stratification

EXAM:
Coronary Calcium Score
TECHNIQUE: The patient was scanned on a Siemens Force scanner. Axial
non-contrast 3 mm slices were carried out through the heart. The
data set was analyzed on a dedicated work station and scored using
the Agatson method.

[Series 2: casc 3.0 i36f 2 bestdiast 73 % · axial · 0.45mm/px · z∈[-264,-162]mm · 8 of 44 slices shown, 10 images]
[im 5/44  vessel]
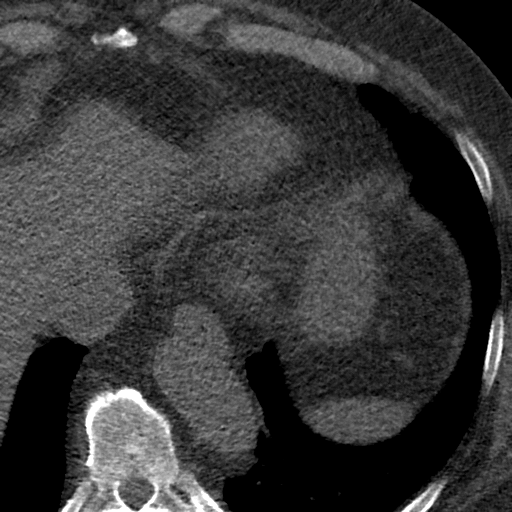
[im 5/44  lung]
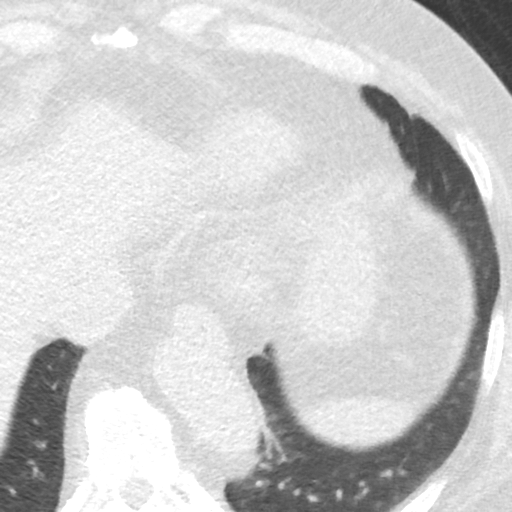
[im 10/44  vessel]
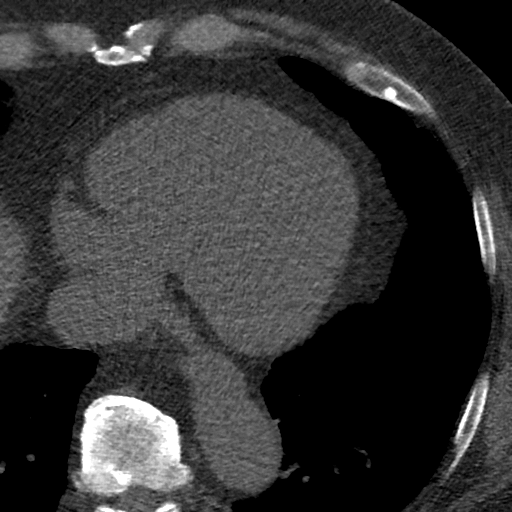
[im 15/44  vessel]
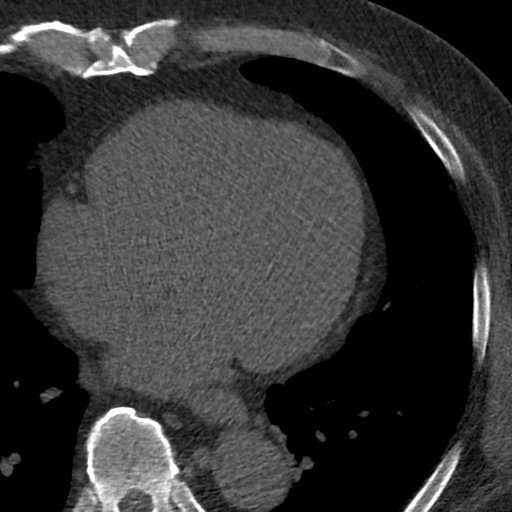
[im 20/44  vessel]
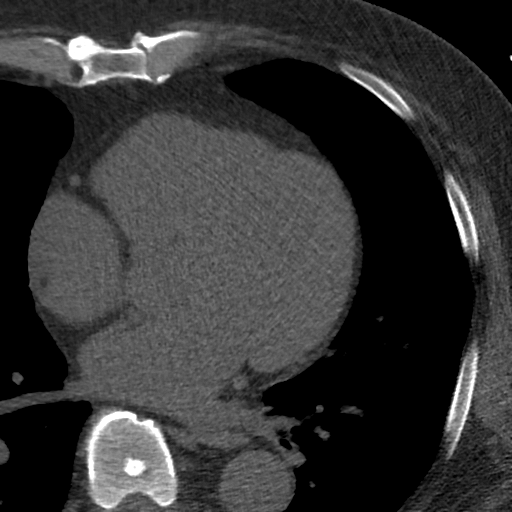
[im 24/44  vessel]
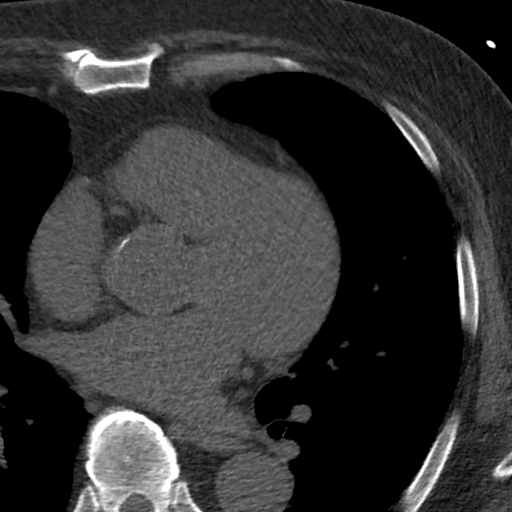
[im 24/44  lung]
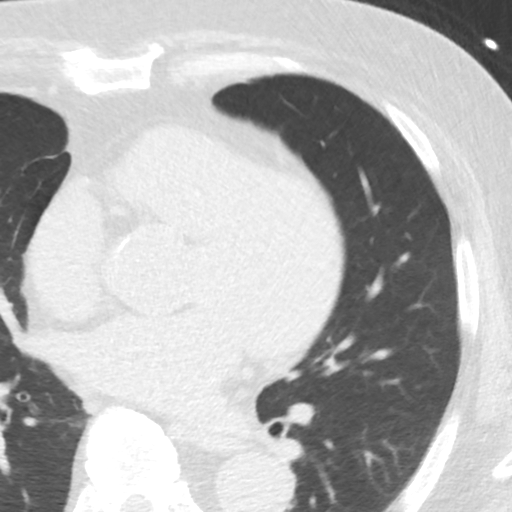
[im 29/44  vessel]
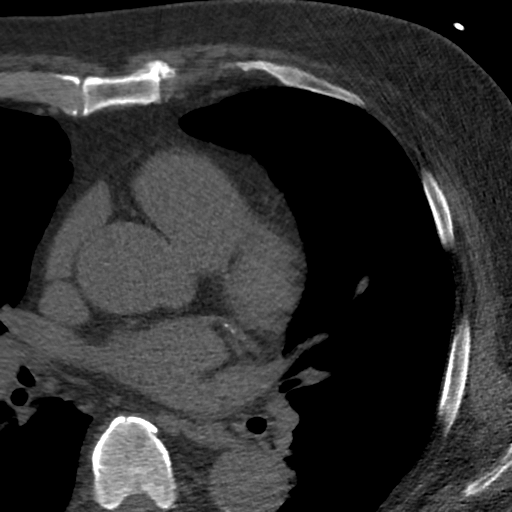
[im 34/44  vessel]
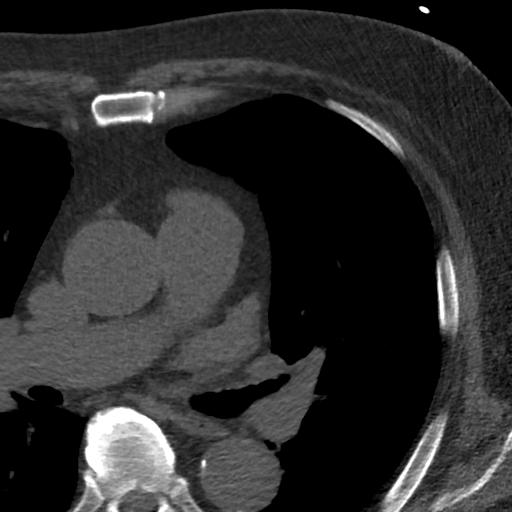
[im 39/44  vessel]
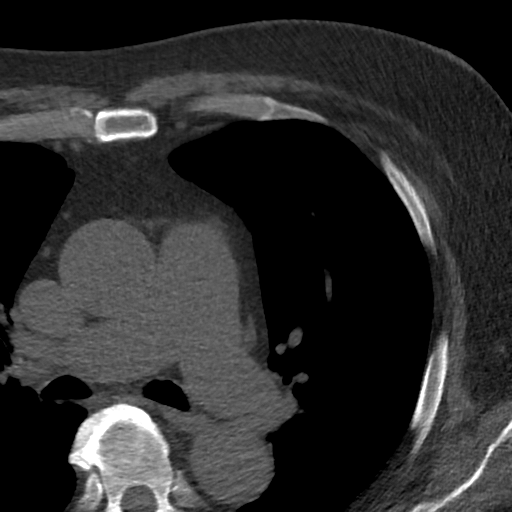

[Series 4: lung st 73 % · axial · 0.81mm/px · z∈[-264,-162]mm · 8 of 44 slices shown]
[im 5/44  lung]
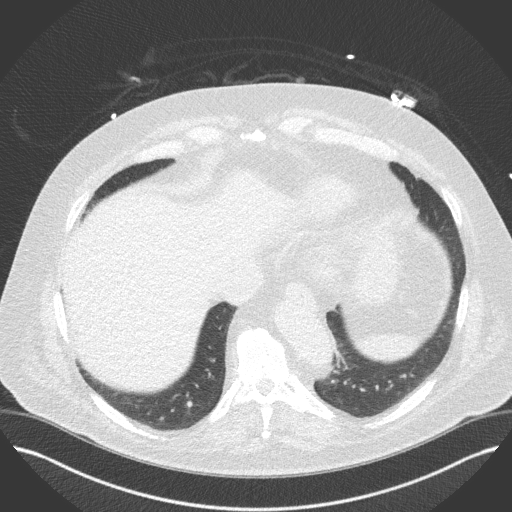
[im 10/44  lung]
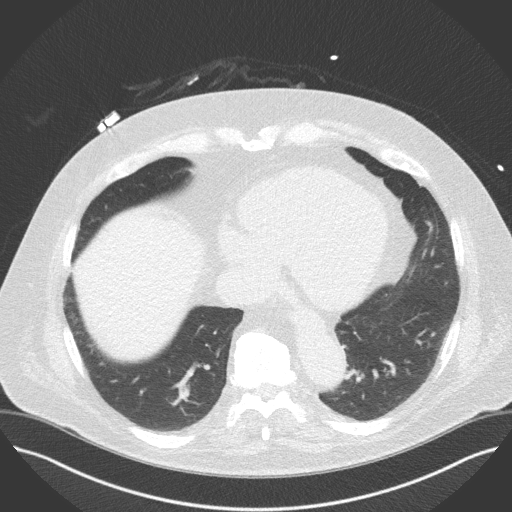
[im 15/44  lung]
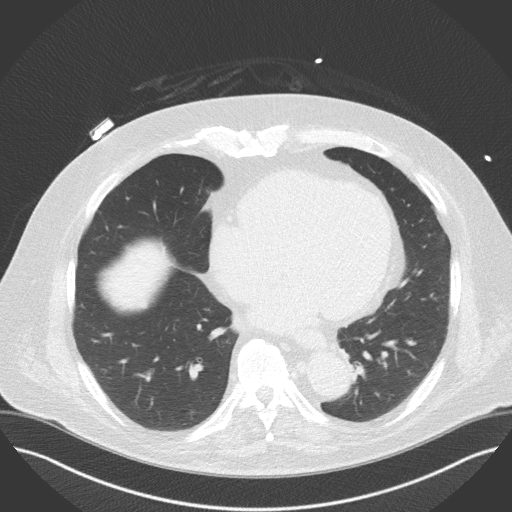
[im 20/44  lung]
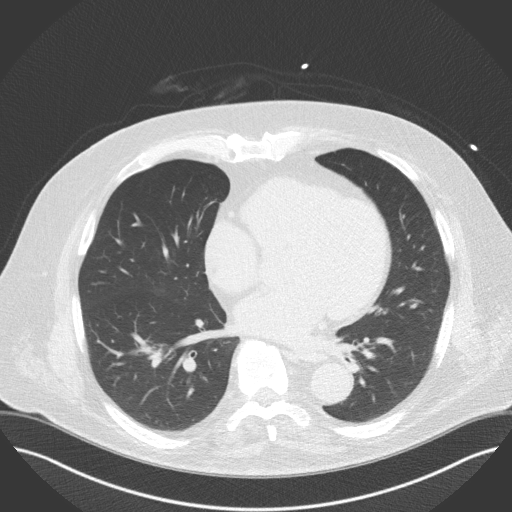
[im 24/44  lung]
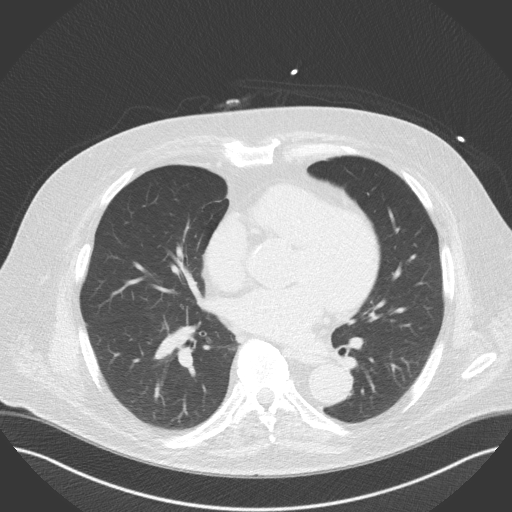
[im 29/44  lung]
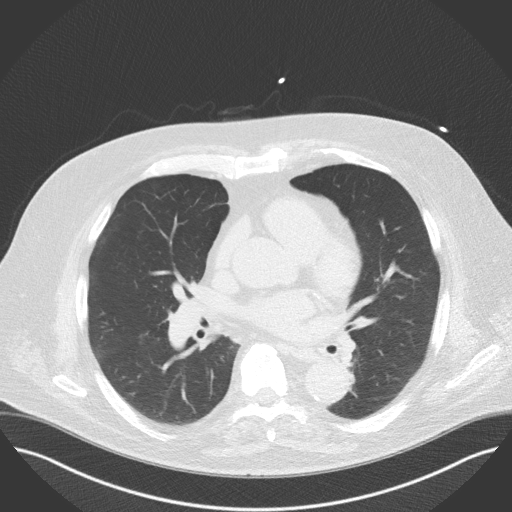
[im 34/44  lung]
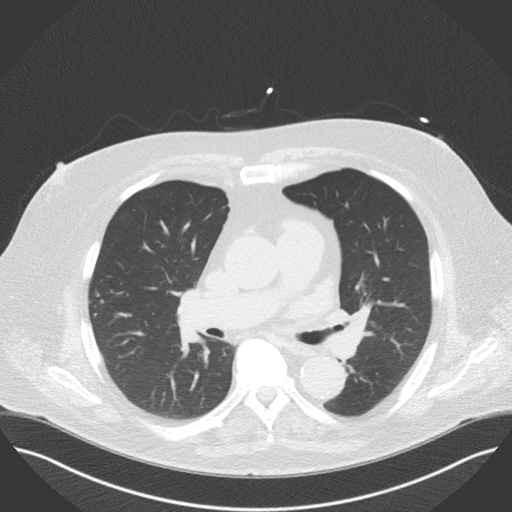
[im 39/44  lung]
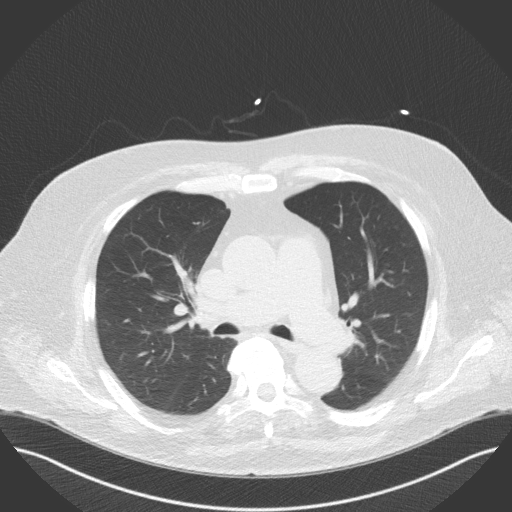

[16 of 20 positions shown; findings below may reference images not displayed]

FINDINGS: Non-cardiac: See separate report from [REDACTED].

Ascending Aorta: Mildly dilated at 4.3cm. Mild to moderate
calcifications.

Pericardium: Normal

Coronary arteries: Normal coronary origins. Coronary calcifications
noted in LM, LAD, LCX and RCA.
IMPRESSION: Coronary calcium score of 234. This was 57th percentile for age and
sex matched control.

Mild ascending aortic aneurysm at 4.3cm.

Recommend aggressive risk factor modification.

Rinkachikumo Terreros

EXAM:
OVER-READ INTERPRETATION  CT CHEST

The following report is an over-read performed by radiologist Dr.
Jarred Pelon [REDACTED] on 03/20/2019. This over-read
does not include interpretation of cardiac or coronary anatomy or
pathology. The coronary calcium score interpretation by the
cardiologist is attached.
FINDINGS: Heart is normal size. 4.3 cm ascending thoracic aortic aneurysm.
Scattered calcifications in the visualized ascending and descending
thoracic aorta. No adenopathy in the lower mediastinum or hila. No
confluent opacities or effusions.

Imaging into the upper abdomen shows no acute findings. Chest wall
soft tissues are unremarkable. No acute bony abnormality.
IMPRESSION: 4.3 cm ascending thoracic aortic aneurysm. Recommend annual imaging
followup by CTA or MRA. This recommendation follows 1545
ACCF/AHA/AATS/ACR/ASA/SCA/WALTHER/KIDS/KANATA/ADIS Guidelines for the
Diagnosis and Management of Patients with Thoracic Aortic Disease.
Circulation. 1545; 121: [id number]. Aortic aneurysm NOS ([id number].R)

*** End of Addendum ***
FINDINGS: Non-cardiac: See separate report from [REDACTED].

Ascending Aorta: Mildly dilated at 4.3cm. Mild to moderate
calcifications.

Pericardium: Normal

Coronary arteries: Normal coronary origins. Coronary calcifications
noted in LM, LAD, LCX and RCA.
IMPRESSION: Coronary calcium score of 234. This was 57th percentile for age and
sex matched control.

Mild ascending aortic aneurysm at 4.3cm.

Recommend aggressive risk factor modification.

Rinkachikumo Terreros

## 2020-07-16 DIAGNOSIS — M0609 Rheumatoid arthritis without rheumatoid factor, multiple sites: Secondary | ICD-10-CM | POA: Diagnosis not present

## 2020-07-28 ENCOUNTER — Ambulatory Visit: Payer: Medicare Other | Admitting: Neurology

## 2020-07-28 ENCOUNTER — Ambulatory Visit: Payer: Self-pay | Admitting: Neurology

## 2020-07-29 ENCOUNTER — Encounter: Payer: Self-pay | Admitting: *Deleted

## 2020-07-29 ENCOUNTER — Ambulatory Visit (INDEPENDENT_AMBULATORY_CARE_PROVIDER_SITE_OTHER): Payer: Medicare Other | Admitting: Neurology

## 2020-07-29 VITALS — BP 135/79 | HR 63 | Ht 73.0 in | Wt 289.5 lb

## 2020-07-29 DIAGNOSIS — F09 Unspecified mental disorder due to known physiological condition: Secondary | ICD-10-CM | POA: Diagnosis not present

## 2020-07-29 DIAGNOSIS — G4739 Other sleep apnea: Secondary | ICD-10-CM

## 2020-07-29 DIAGNOSIS — E538 Deficiency of other specified B group vitamins: Secondary | ICD-10-CM | POA: Diagnosis not present

## 2020-07-29 DIAGNOSIS — R7989 Other specified abnormal findings of blood chemistry: Secondary | ICD-10-CM | POA: Diagnosis not present

## 2020-07-29 DIAGNOSIS — R799 Abnormal finding of blood chemistry, unspecified: Secondary | ICD-10-CM | POA: Diagnosis not present

## 2020-07-29 HISTORY — DX: Other sleep apnea: G47.39

## 2020-07-29 HISTORY — DX: Unspecified mental disorder due to known physiological condition: F09

## 2020-07-29 MED ORDER — DONEPEZIL HCL 10 MG PO TABS
10.0000 mg | ORAL_TABLET | Freq: Every day | ORAL | 4 refills | Status: DC
Start: 2020-07-29 — End: 2021-08-13

## 2020-07-29 NOTE — Progress Notes (Signed)
Chief Complaint  Patient presents with  . Memory Loss    New Pt  wife- [name]      ASSESSMENT AND PLAN  Brett Rosales is a 75 y.o. male   Mild cognitive impairment  MoCA examination 23/30  MRI of the brain to rule out structural abnormality  Laboratory evaluations to rule out treatable etiology  He reported improvement with Aricept 5 mg, when taking at nighttime complains of excessive dreams, improved taking in the morning, will increase the dose to 10 mg daily  Excessive snoring, fatigue,  He has obesity, at risk for obstructive sleep apnea,  Refer to sleep study  DIAGNOSTIC DATA (LABS, IMAGING, TESTING) - I reviewed patient records, labs, notes, testing and imaging myself where available.  Lab in March 2022:  CBC, Hg 14.5, CMP, creat 1.2,  Chol 222,  HISTORICAL  Brett Rosales, is a 75 year old male, seen in request by his primary care physician Dr. Helene Kelp, Abagail Kitchens for evaluation of memory loss, excessive fatigue, he is accompanied by his wife at today's visit on Jul 30, 2020  I reviewed and summarized the referring note. PMHX. 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, has multiple degenerative joint disease, chronic low back pain, has been treated as rheumatoid arthritis for many years, use different medications, including Remicade, methotrexate, also had a history of atrial fibrillation, on chronic Xarelto  He was noted to have gradual onset of memory loss since his multiple joint replacement surgery in 2020, he had left hip replacement in 2020, left knee replacement in summer 2021, and right knee replacement in November 2021  Following all those general anesthesia, he was noted to have some changes, he often forgets to clean himself up after project, he used to be very good with  direction, now often misses a turn when driving,  There was no family history of dementia, he was put on Aricept 5 mg few months ago, while he was taking every night, he complains of excessive dreams, improved after move the dosage to every morning, he is independent in his daily activity, MoCA examination is 23/30 today  Because of his multiple joints pain, he did not exercise regularly, did have weight gain, used to have loud snoring, sometimes up in the middle of the night catching his breath  PHYSICAL EXAM:   Vitals:   07/29/20 1515  BP: 135/79  Pulse: 63  Weight: 289 lb 8 oz (131.3 kg)  Height: 6\' 1"  (1.854 m)   Not recorded     Body mass index is 38.19 kg/m.  PHYSICAL EXAMNIATION:  Gen: NAD, conversant, well nourised, well groomed                     Cardiovascular: Regular rate rhythm, no peripheral edema, warm, nontender. Eyes: Conjunctivae clear without exudates or hemorrhage Neck: Supple, no carotid bruits. Pulmonary: Clear to auscultation bilaterally   NEUROLOGICAL EXAM:  MENTAL STATUS: Obese  speech:    Speech is normal; fluent and spontaneous with normal comprehension.  Cognition:     Montreal Cognitive Assessment  07/29/2020  Visuospatial/ Executive (0/5) 4  Naming (0/3) 2  Attention: Read list of digits (0/2) 2  Attention: Read list of letters (0/1) 0  Attention: Serial 7 subtraction starting at 100 (0/3) 3  Language: Repeat phrase (0/2)  2  Language : Fluency (0/1) 1  Abstraction (0/2) 2  Delayed Recall (0/5) 1  Orientation (0/6) 6  Total 23     CRANIAL NERVES: CN II: Visual fields are full to confrontation. Pupils are round equal and briskly reactive to light. CN III, IV, VI: extraocular movement are normal. No ptosis. CN V: Facial sensation is intact to light touch CN VII: Face is symmetric with normal eye closure  CN VIII: Hearing is normal to causal conversation. CN IX, X: Phonation is normal.  Narrow oral pharyngeal CN XI: Head turning and  shoulder shrug are intact  MOTOR: There is no pronator drift of out-stretched arms. Muscle bulk and tone are normal. Muscle strength is normal.  REFLEXES: Reflexes are 2+ and symmetric at the biceps, triceps, knees, and ankles. Plantar responses are flexor.  SENSORY: Intact to light touch, pinprick and vibratory sensation are intact in fingers and toes.  COORDINATION: There is no trunk or limb dysmetria noted.  GAIT/STANCE: Need push-up to get up from seated position, steady, mild limitation by his big body habitus  REVIEW OF SYSTEMS:  Full 14 system review of systems performed and notable only for as above All other review of systems were negative.   ALLERGIES: Allergies  Allergen Reactions  . Nsaids Other (See Comments)    "Eats hole in stomach".Marland KitchenMarland KitchenMarland KitchenIbuprofen, Meloxicam...  . Statins Other (See Comments)    Myalgias    HOME MEDICATIONS: Current Outpatient Medications  Medication Sig Dispense Refill  . Cholecalciferol (VITAMIN D3 PO) Take 2,000 Units by mouth daily.    Mariane Baumgarten Sodium (DSS) 100 MG CAPS Take 100 mg by mouth in the morning and at bedtime.    . donepezil (ARICEPT) 5 MG tablet Take 5 mg by mouth daily.    . DULoxetine (CYMBALTA) 30 MG capsule Take 30 mg by mouth daily.    Marland Kitchen ezetimibe (ZETIA) 10 MG tablet Take 10 mg by mouth daily.    . folic acid (FOLVITE) 1 MG tablet Take 1 mg by mouth daily.    Marland Kitchen inFLIXimab (REMICADE IV) Inject into the vein. Every 6 weeks    . losartan (COZAAR) 100 MG tablet Take 100 mg by mouth daily.    . methotrexate 50 MG/2ML injection Inject 25 mg into the skin once a week.     . methylphenidate (RITALIN) 20 MG tablet Take 40 mg by mouth daily.     . metoprolol tartrate (LOPRESSOR) 25 MG tablet TAKE 1 TABLET BY MOUTH TWICE DAILY. (Patient taking differently: Take 25 mg by mouth 2 (two) times daily.) 180 tablet 3  . NON FORMULARY For eye sight    . oxyCODONE (OXY IR/ROXICODONE) 5 MG immediate release tablet Take 1-2 tablets (5-10 mg  total) by mouth every 6 (six) hours as needed for severe pain. 50 tablet 0  . rivaroxaban (XARELTO) 20 MG TABS tablet Take 1 tablet (20 mg total) by mouth daily with supper. Start 7/20 (Patient taking differently: Take 20 mg by mouth daily with supper.) 180 tablet 1  . UNABLE TO FIND Med Name: Lecithin    . B Complex Vitamins (VITAMIN B COMPLEX) TABS Take 1 tablet by mouth daily. (Patient not taking: Reported on 07/29/2020)    . gabapentin (NEURONTIN) 300 MG capsule Take 1 capsule (300 mg total) by mouth 3 (three) times daily. (Patient not taking: Reported on 07/29/2020) 30 capsule 0  . methocarbamol (ROBAXIN) 500 MG tablet Take 1-2 tablets (500-1,000 mg total) by mouth 4 (four) times daily. (Patient not  taking: Reported on 07/29/2020) 60 tablet 0   No current facility-administered medications for this visit.    PAST MEDICAL HISTORY: Past Medical History:  Diagnosis Date  . Acute foreign body of ear canal 11/12/2015  . Anxiety    medication made him feel flat stopped meds 09/07/2018  . Arthritis   . Ascending aortic aneurysm ([place]) 12/23/2017  . Bilateral chronic knee pain 08/14/2019  . BMI 39.0-39.9,adult 06/03/2020  . Complication of anesthesia    Knee block didn't take in 7/21 lt knee  . Depression   . Dyslipidemia 01/23/2019  . Dyspnea on exertion 12/09/2017  . Dysrhythmia 11/2017   a flutter  . Essential hypertension 12/09/2017  . History of kidney stones    13 stones  . Hypertension   . 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 Rheumatoid arthritis ([place]) 12/09/2017  . 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 S/P TKR (total knee replacement) using cement, 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  . Sleep apnea 03/2018   Had study after episode of disrhythmia. it was inconclusive. needs follow up  . Supraventricular tachycardia ([place]) 12/09/2017  . Swelling of left lower extremity 05/17/2018  . Typical atrial flutter ([place]) 12/16/2017    PAST SURGICAL HISTORY: Past Surgical History:   Procedure Laterality Date  . TONSILLECTOMY AND ADENOIDECTOMY    . TOTAL HIP ARTHROPLASTY Left 09/21/2018   Procedure: TOTAL HIP ARTHROPLASTY ANTERIOR APPROACH;  Surgeon: Rod Can, MD;  Location: [facility];  Service: Orthopedics;  Laterality: Left;  . TOTAL KNEE ARTHROPLASTY Left 09/24/2019   Procedure: TOTAL KNEE ARTHROPLASTY;  Surgeon: Vickey Huger, MD;  Location: [facility];  Service: Orthopedics;  Laterality: Left;  . TOTAL KNEE ARTHROPLASTY Right 02/04/2020   Procedure: TOTAL KNEE ARTHROPLASTY;  Surgeon: Vickey Huger, MD;  Location: [facility];  Service: Orthopedics;  Laterality: Right;  Marland Kitchen VASECTOMY  1990    FAMILY HISTORY: Family History  Problem Relation Age of Onset  . Diabetes Mother   . Cancer Mother   . Hypertension Father     SOCIAL HISTORY: Social History   Socioeconomic History  . Marital status: Married    Spouse name: [name]  . Number of children: 3  . Years of education: Not on file  . Highest education level: High school graduate  Occupational History  . Not on file  Tobacco Use  . Smoking status: Former Smoker    Packs/day: 1.00    Years: 15.00    Pack years: 15.00    Types: Cigarettes    Quit date: 01/29/1986    Years since quitting: 34.5  . Smokeless tobacco: Never Used  Vaping Use  . Vaping Use: Never used  Substance and Sexual Activity  . Alcohol use: No    Alcohol/week: 0.0 standard drinks  . Drug use: No  . Sexual activity: Not on file  Other Topics Concern  . Not on file  Social History Narrative   Lives with wife   Social Determinants of Health   Financial Resource Strain: Not on file  Food Insecurity: Not on file  Transportation Needs: Not on file  Physical Activity: Not on file  Stress: Not on file  Social Connections: Not on file  Intimate Partner Violence: Not on file      Marcial Pacas, M.D. Ph.D.  [facility] [address], [place], [place] [address] Ph: [phone] Fax: [phone]  CC:  Ronita Hipps, MD [address] [address],   Ronita Hipps, MD

## 2020-07-30 ENCOUNTER — Encounter: Payer: Self-pay | Admitting: Neurology

## 2020-07-30 LAB — RPR: RPR Ser Ql: NONREACTIVE

## 2020-07-30 LAB — TSH: TSH: 2.32 u[IU]/mL (ref 0.450–4.500)

## 2020-07-30 LAB — VITAMIN B12: Vitamin B-12: 360 pg/mL (ref 232–1245)

## 2020-08-04 DIAGNOSIS — I10 Essential (primary) hypertension: Secondary | ICD-10-CM | POA: Diagnosis not present

## 2020-08-04 DIAGNOSIS — E785 Hyperlipidemia, unspecified: Secondary | ICD-10-CM | POA: Diagnosis not present

## 2020-08-06 ENCOUNTER — Telehealth: Payer: Self-pay | Admitting: Neurology

## 2020-08-06 NOTE — Telephone Encounter (Signed)
Patient requested to have his MRI brain wo contrast at [facility]. MD signed MRI order and I faxed it to [facility] at [phone].

## 2020-09-02 DIAGNOSIS — M0609 Rheumatoid arthritis without rheumatoid factor, multiple sites: Secondary | ICD-10-CM | POA: Diagnosis not present

## 2020-09-04 DIAGNOSIS — I10 Essential (primary) hypertension: Secondary | ICD-10-CM | POA: Diagnosis not present

## 2020-09-04 DIAGNOSIS — E785 Hyperlipidemia, unspecified: Secondary | ICD-10-CM | POA: Diagnosis not present

## 2020-09-09 DIAGNOSIS — E7800 Pure hypercholesterolemia, unspecified: Secondary | ICD-10-CM | POA: Diagnosis not present

## 2020-09-09 DIAGNOSIS — Z6839 Body mass index (BMI) 39.0-39.9, adult: Secondary | ICD-10-CM | POA: Diagnosis not present

## 2020-09-09 DIAGNOSIS — Z1331 Encounter for screening for depression: Secondary | ICD-10-CM | POA: Diagnosis not present

## 2020-09-09 DIAGNOSIS — Z0000 Encounter for general adult medical examination without abnormal findings: Secondary | ICD-10-CM | POA: Diagnosis not present

## 2020-09-11 ENCOUNTER — Encounter: Payer: Self-pay | Admitting: Cardiology

## 2020-09-11 ENCOUNTER — Ambulatory Visit (INDEPENDENT_AMBULATORY_CARE_PROVIDER_SITE_OTHER): Payer: Medicare Other | Admitting: Cardiology

## 2020-09-11 ENCOUNTER — Other Ambulatory Visit: Payer: Self-pay

## 2020-09-11 VITALS — BP 122/70 | HR 68 | Ht 74.0 in | Wt 289.6 lb

## 2020-09-11 DIAGNOSIS — I471 Supraventricular tachycardia: Secondary | ICD-10-CM

## 2020-09-11 DIAGNOSIS — I712 Thoracic aortic aneurysm, without rupture: Secondary | ICD-10-CM

## 2020-09-11 DIAGNOSIS — G319 Degenerative disease of nervous system, unspecified: Secondary | ICD-10-CM | POA: Diagnosis not present

## 2020-09-11 DIAGNOSIS — J019 Acute sinusitis, unspecified: Secondary | ICD-10-CM | POA: Diagnosis not present

## 2020-09-11 DIAGNOSIS — I483 Typical atrial flutter: Secondary | ICD-10-CM | POA: Diagnosis not present

## 2020-09-11 DIAGNOSIS — H0520 Unspecified exophthalmos: Secondary | ICD-10-CM | POA: Diagnosis not present

## 2020-09-11 DIAGNOSIS — G4733 Obstructive sleep apnea (adult) (pediatric): Secondary | ICD-10-CM

## 2020-09-11 DIAGNOSIS — F09 Unspecified mental disorder due to known physiological condition: Secondary | ICD-10-CM | POA: Diagnosis not present

## 2020-09-11 DIAGNOSIS — R413 Other amnesia: Secondary | ICD-10-CM | POA: Diagnosis not present

## 2020-09-11 DIAGNOSIS — I10 Essential (primary) hypertension: Secondary | ICD-10-CM | POA: Diagnosis not present

## 2020-09-11 DIAGNOSIS — I7121 Aneurysm of the ascending aorta, without rupture: Secondary | ICD-10-CM

## 2020-09-11 NOTE — Patient Instructions (Signed)
Medication Instructions:  Your physician recommends that you continue on your current medications as directed. Please refer to the Current Medication list given to you today.  *If you need a refill on your cardiac medications before your next appointment, please call your pharmacy*   Lab Work:  If you have labs (blood work) drawn today and your tests are completely normal, you will receive your results only by: [facility] (if you have MyChart) OR A paper copy in the mail If you have any lab test that is abnormal or we need to change your treatment, we will call you to review the results.   Testing/Procedures:    Follow-Up: At [facility], you and your health needs are our priority.  As part of our continuing mission to provide you with exceptional heart care, we have created designated Provider Care Teams.  These Care Teams include your primary Cardiologist (physician) and Advanced Practice Providers (APPs -  Physician Assistants and Nurse Practitioners) who all work together to provide you with the care you need, when you need it.  We recommend signing up for the patient portal called "MyChart".  Sign up information is provided on this After Visit Summary.  MyChart is used to connect with patients for Virtual Visits (Telemedicine).  Patients are able to view lab/test results, encounter notes, upcoming appointments, etc.  Non-urgent messages can be sent to your provider as well.   To learn more about what you can do with MyChart, go to [email or web address].    Your next appointment:   6 month(s)  The format for your next appointment:   In Person  Provider:   Jenne Campus, MD   Other Instructions

## 2020-09-11 NOTE — Progress Notes (Signed)
Cardiology Office Note:    Date:  09/11/2020   ID:  Brett Rosales, DOB 05-19-1945, MRN [id number]  PCP:  Ronita Hipps, MD  Cardiologist:  Jenne Campus, MD    Referring MD: Ronita Hipps, MD   Chief Complaint  Patient presents with   Follow-up    History of Present Illness:    Brett Rosales is a 75 y.o. male with past medical history significant for paroxysmal atrial flutter, he is anticoagulated, ascending aortic aneurysm measuring 4.3 cm, dyslipidemia, dyspnea on exertion.  He also got rheumatoid arthritis.  He comes today to my office for follow-up.  Overall he is doing well he did have both knee replaced and he is trying to be able be more active.  Initially his gait was somewhat unsteady but he is getting better from that point review.  Denies have any chest pain tightness squeezing pressure burning in the chest, there is no palpitations there is no dizziness.  Overall doing well.  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 it was inconclusive. needs follow up   Supraventricular tachycardia ([place]) 12/09/2017   Swelling of left lower extremity 05/17/2018   Typical atrial flutter ([place]) 12/16/2017    Past  Surgical History:  Procedure Laterality Date   TONSILLECTOMY AND ADENOIDECTOMY     TOTAL HIP ARTHROPLASTY Left 09/21/2018   Procedure: TOTAL HIP ARTHROPLASTY ANTERIOR APPROACH;  Surgeon: Rod Can, MD;  Location: [facility];  Service: Orthopedics;  Laterality: Left;   TOTAL KNEE ARTHROPLASTY Left 09/24/2019   Procedure: TOTAL KNEE ARTHROPLASTY;  Surgeon: Vickey Huger, MD;  Location: [facility];  Service: Orthopedics;  Laterality: Left;   TOTAL KNEE ARTHROPLASTY Right 02/04/2020   Procedure: TOTAL KNEE ARTHROPLASTY;  Surgeon: Vickey Huger, MD;  Location: [facility];  Service: Orthopedics;  Laterality: Right;   VASECTOMY  1990    Current Medications: Current Meds  Medication Sig   Cholecalciferol (VITAMIN D3 PO) Take 2,000 Units by mouth daily.   Docusate Sodium (DSS) 100 MG CAPS Take 100 mg by mouth in the morning and at bedtime.   donepezil (ARICEPT) 10 MG tablet Take 1 tablet (10 mg total) by mouth daily.   DULoxetine (CYMBALTA) 30 MG capsule Take 30 mg by mouth daily.   ezetimibe (ZETIA) 10 MG tablet Take 10 mg by mouth daily.   folic acid (FOLVITE) 1 MG tablet Take 1 mg by mouth daily.   inFLIXimab (REMICADE IV) Inject 1 vial into the vein See admin instructions. Every 6 weeks/ Unknown strenght   losartan (COZAAR) 100 MG tablet Take 100 mg by mouth daily.   methotrexate 50 MG/2ML  injection Inject 25 mg into the skin once a week.    methylphenidate (RITALIN) 20 MG tablet Take 40 mg by mouth daily.    metoprolol tartrate (LOPRESSOR) 25 MG tablet TAKE 1 TABLET BY MOUTH TWICE DAILY. (Patient taking differently: Take 25 mg by mouth 2 (two) times daily.)   NON FORMULARY Place 1 drop into both eyes as needed. For eye sight   rivaroxaban (XARELTO) 20 MG TABS tablet Take 1 tablet (20 mg total) by mouth daily with supper. Start 7/20 (Patient taking differently: Take 20 mg by mouth daily with supper.)   UNABLE TO FIND Take 1 tablet by mouth daily. Med Name: Lecithin/ Unknown strenght     Allergies:    Nsaids and Statins   Social History   Socioeconomic History   Marital status: Married    Spouse name: Brett Rosales   Number of children: 3   Years of education: Not on file   Highest education level: High school graduate  Occupational History   Not on file  Tobacco Use   Smoking status: Former    Packs/day: 1.00    Years: 15.00    Pack years: 15.00    Types: Cigarettes    Quit date: 01/29/1986    Years since quitting: 34.6   Smokeless tobacco: Never  Vaping Use   Vaping Use: Never used  Substance and Sexual Activity   Alcohol use: No    Alcohol/week: 0.0 standard drinks   Drug use: No   Sexual activity: Not on file  Other Topics Concern   Not on file  Social History Narrative   Lives with wife   Social Determinants of Health   Financial Resource Strain: Not on file  Food Insecurity: Not on file  Transportation Needs: Not on file  Physical Activity: Not on file  Stress: Not on file  Social Connections: Not on file     Family History: The patient's family history includes Cancer in his mother; Diabetes in his mother; Hypertension in his father. ROS:   Please see the history of present illness.    All 14 point review of systems negative except as described per history of present illness  EKGs/Labs/Other Studies Reviewed:      Recent Labs: 01/30/2020: ALT 17; BUN 27; Creatinine, Ser 1.09; Hemoglobin 14.1; Platelets 247; Potassium 5.2; Sodium 140 07/29/2020: TSH 2.320  Recent Lipid Panel    Component Value Date/Time   CHOL 233 (H) 12/13/2019 1457   TRIG 130 12/13/2019 1457   HDL 88 12/13/2019 1457   CHOLHDL 2.6 12/13/2019 1457   LDLCALC 123 (H) 12/13/2019 1457    Physical Exam:    VS:  BP 122/70 (BP Location: Right Arm, Patient Position: Sitting)   Pulse 68   Ht 6\' 2"  (1.88 m)   Wt 289 lb 9.6 oz (131.4 kg)   SpO2 96%   BMI 37.18 kg/m     Wt Readings from Last 3 Encounters:  09/11/20 289 lb 9.6 oz (131.4 kg)  07/29/20 289 lb 8 oz (131.3 kg)  02/04/20  292 lb (132.5 kg)     GEN:  Well nourished, well developed in no acute distress HEENT: Normal NECK: No JVD; No carotid bruits LYMPHATICS: No lymphadenopathy CARDIAC: RRR, no murmurs, no rubs, no gallops RESPIRATORY:  Clear to auscultation without rales, wheezing or rhonchi  ABDOMEN: Soft, non-tender, non-distended MUSCULOSKELETAL:  No edema; No deformity  SKIN: Warm and dry LOWER EXTREMITIES: no swelling NEUROLOGIC:  Alert and oriented x 3 PSYCHIATRIC:  Normal affect  ASSESSMENT:    1. Typical atrial flutter ([place])   2. Ascending aortic aneurysm ([place])   3. Essential hypertension   4. Supraventricular tachycardia ([place])   5. Obstructive sleep apnea syndrome    PLAN:    In order of problems listed above:  Typical atrial flutter denies have any palpitations maintaining sinus rhythm EKG today showed sinus rhythm.  Anticoagulated which I will continue, his CHADS2 Vascor equals 3 Ascending aortic aneurysm stable I will recheck it again in about 6 months. Essential hypertension: Blood pressure seems to be well controlled continue present management. History of supraventricular tachycardia no recurrences Dyslipidemia I did review his K PN and I calculated his 10 years predicted risk which came as 13.4 which is intermediate.  Considering the fact that he does have chronic inflammatory process which is rheumatoid arthritis he desired to be on statin.  He is somewhat reluctant but he is willing to reconsider.  Sadly the data that I have show me LDL from October.  We will contact his primary care physician to get more updated data recalculate his value and then decide about potentially treatment with statin.  He said that previously he was trying atorvastatin and had some difficulties.  Therefore, we will probably try with pravastatin which we decided to treat this.   Medication Adjustments/Labs and Tests Ordered: Current medicines are reviewed at length with the patient today.  Concerns  regarding medicines are outlined above.  Orders Placed This Encounter  Procedures   EKG 12-Lead   Medication changes: No orders of the defined types were placed in this encounter.   Signed, Park Liter, MD, [facility] 09/11/2020 5:01 PM    [facility]

## 2020-09-16 DIAGNOSIS — M150 Primary generalized (osteo)arthritis: Secondary | ICD-10-CM | POA: Diagnosis not present

## 2020-09-16 DIAGNOSIS — Z6838 Body mass index (BMI) 38.0-38.9, adult: Secondary | ICD-10-CM | POA: Diagnosis not present

## 2020-09-16 DIAGNOSIS — M0609 Rheumatoid arthritis without rheumatoid factor, multiple sites: Secondary | ICD-10-CM | POA: Diagnosis not present

## 2020-09-16 DIAGNOSIS — E669 Obesity, unspecified: Secondary | ICD-10-CM | POA: Diagnosis not present

## 2020-09-16 DIAGNOSIS — M2550 Pain in unspecified joint: Secondary | ICD-10-CM | POA: Diagnosis not present

## 2020-09-16 DIAGNOSIS — Z79899 Other long term (current) drug therapy: Secondary | ICD-10-CM | POA: Diagnosis not present

## 2020-10-01 ENCOUNTER — Telehealth: Payer: Self-pay | Admitting: Neurology

## 2020-10-01 NOTE — Telephone Encounter (Signed)
Please call patient, MRI of the brain without contrast on September 11, 2020 from [facility]  No acute abnormality, mild age related atrophy small vessel disease, moderate paranasal sinus disease,

## 2020-10-02 DIAGNOSIS — H25813 Combined forms of age-related cataract, bilateral: Secondary | ICD-10-CM | POA: Diagnosis not present

## 2020-10-02 NOTE — Telephone Encounter (Signed)
Patient returned call, discussed with him and wife, Dr. Rhea Belton review and findings of MRI.  Patient denied further questions, verbalized understanding and expressed appreciation for the phone call.

## 2020-10-02 NOTE — Telephone Encounter (Signed)
Left message for a return call. He may speak to anyone in POD 2.

## 2020-10-10 DIAGNOSIS — S63615A Unspecified sprain of left ring finger, initial encounter: Secondary | ICD-10-CM | POA: Diagnosis not present

## 2020-10-10 DIAGNOSIS — M79645 Pain in left finger(s): Secondary | ICD-10-CM | POA: Diagnosis not present

## 2020-10-13 DIAGNOSIS — Z01818 Encounter for other preprocedural examination: Secondary | ICD-10-CM | POA: Diagnosis not present

## 2020-10-13 DIAGNOSIS — H2512 Age-related nuclear cataract, left eye: Secondary | ICD-10-CM | POA: Diagnosis not present

## 2020-10-13 DIAGNOSIS — H25812 Combined forms of age-related cataract, left eye: Secondary | ICD-10-CM | POA: Diagnosis not present

## 2020-10-13 DIAGNOSIS — H25811 Combined forms of age-related cataract, right eye: Secondary | ICD-10-CM | POA: Diagnosis not present

## 2020-10-14 DIAGNOSIS — H25812 Combined forms of age-related cataract, left eye: Secondary | ICD-10-CM | POA: Diagnosis not present

## 2020-10-16 DIAGNOSIS — M0609 Rheumatoid arthritis without rheumatoid factor, multiple sites: Secondary | ICD-10-CM | POA: Diagnosis not present

## 2020-10-16 DIAGNOSIS — Z79899 Other long term (current) drug therapy: Secondary | ICD-10-CM | POA: Diagnosis not present

## 2020-10-20 DIAGNOSIS — H25811 Combined forms of age-related cataract, right eye: Secondary | ICD-10-CM | POA: Diagnosis not present

## 2020-10-20 DIAGNOSIS — H2511 Age-related nuclear cataract, right eye: Secondary | ICD-10-CM | POA: Diagnosis not present

## 2020-10-29 ENCOUNTER — Institutional Professional Consult (permissible substitution): Payer: Medicare Other | Admitting: Neurology

## 2020-10-30 DIAGNOSIS — J329 Chronic sinusitis, unspecified: Secondary | ICD-10-CM | POA: Diagnosis not present

## 2020-10-30 DIAGNOSIS — J40 Bronchitis, not specified as acute or chronic: Secondary | ICD-10-CM | POA: Diagnosis not present

## 2020-11-04 DIAGNOSIS — Z96652 Presence of left artificial knee joint: Secondary | ICD-10-CM | POA: Diagnosis not present

## 2020-11-05 DIAGNOSIS — I10 Essential (primary) hypertension: Secondary | ICD-10-CM | POA: Diagnosis not present

## 2020-11-05 DIAGNOSIS — E785 Hyperlipidemia, unspecified: Secondary | ICD-10-CM | POA: Diagnosis not present

## 2020-11-19 ENCOUNTER — Other Ambulatory Visit: Payer: Self-pay | Admitting: Cardiology

## 2020-11-19 NOTE — Telephone Encounter (Signed)
Prescription refill request for Xarelto received.  Last office visit:krasowski 09/11/20 Weight:131.4g Age:75 Scr:1.09 01/30/20 CrCl:113.9

## 2020-11-27 DIAGNOSIS — M0609 Rheumatoid arthritis without rheumatoid factor, multiple sites: Secondary | ICD-10-CM | POA: Diagnosis not present

## 2020-12-05 DIAGNOSIS — E7800 Pure hypercholesterolemia, unspecified: Secondary | ICD-10-CM | POA: Diagnosis not present

## 2020-12-05 DIAGNOSIS — F988 Other specified behavioral and emotional disorders with onset usually occurring in childhood and adolescence: Secondary | ICD-10-CM | POA: Diagnosis not present

## 2020-12-05 DIAGNOSIS — I10 Essential (primary) hypertension: Secondary | ICD-10-CM | POA: Diagnosis not present

## 2020-12-30 DIAGNOSIS — M25512 Pain in left shoulder: Secondary | ICD-10-CM | POA: Diagnosis not present

## 2021-01-01 ENCOUNTER — Ambulatory Visit (INDEPENDENT_AMBULATORY_CARE_PROVIDER_SITE_OTHER): Payer: Medicare Other | Admitting: Neurology

## 2021-01-01 ENCOUNTER — Encounter: Payer: Self-pay | Admitting: Neurology

## 2021-01-01 VITALS — BP 143/78 | HR 53 | Ht 74.0 in | Wt 280.5 lb

## 2021-01-01 DIAGNOSIS — I4892 Unspecified atrial flutter: Secondary | ICD-10-CM

## 2021-01-01 DIAGNOSIS — G479 Sleep disorder, unspecified: Secondary | ICD-10-CM

## 2021-01-01 DIAGNOSIS — F09 Unspecified mental disorder due to known physiological condition: Secondary | ICD-10-CM | POA: Diagnosis not present

## 2021-01-01 DIAGNOSIS — G4739 Other sleep apnea: Secondary | ICD-10-CM | POA: Diagnosis not present

## 2021-01-01 DIAGNOSIS — G8929 Other chronic pain: Secondary | ICD-10-CM

## 2021-01-01 HISTORY — DX: Other chronic pain: G89.29

## 2021-01-01 HISTORY — DX: Unspecified atrial flutter: I48.92

## 2021-01-01 HISTORY — DX: Sleep disorder, unspecified: G47.9

## 2021-01-01 NOTE — Progress Notes (Signed)
SLEEP MEDICINE CLINIC    Provider:  Larey Seat, MD  Primary Care Physician:  Ronita Hipps, MD [address],[place] [address]     Referring Provider: Marcial Pacas, [address] [place] [address] [place],  [place] [address]          Chief Complaint according to patient   Patient presents with:     New Patient (Initial Visit)           HISTORY OF PRESENT ILLNESS:  Brett Rosales is a 75 y.o. Caucasian male patient seen here  in the presence of his spouse , Brett Rosales, upon  a referral by Dr Krista Blue on 01/01/2021 .  Chief concern according to patient :  Wife reports snoring and witnessed apneas, he wakes up stiff, is in pain- and this started 20 years ago. rheumatologist is working him up. He has taking infusion for rheumatoid arthritis. REMICADE.  He had episodic atrial flutter in 2020 and a sleep test was ordered then. Had a PSG there but could not sleep.    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   The patient had the first sleep study in the year 1/ 2020-  had not slept , he insisted.  ( Logged 140 minutes of sleep only, very fragmented)   Sleep relevant medical history: Snoring , most loud when in supine- apnea.  Nocturia ,1-2 Sleep walking,  had a Tonsillectomy,pain, lower back, hip replacement 2020, 2 knee in 2021. Left shoulder injury.    Family medical /sleep history: No other family member on CPAP with OSA, insomnia, sleep walkers.    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 Tobacco use; none since 1990.  ETOH use ; none ,  Caffeine intake in form of Coffee( /) Soda( /) Tea ( /) or energy drinks. Regular exercise - none. Hurts too much  .   Hobbies : wood shop.       Sleep habits are as follows: The patient's dinner time is between 5-9.30 PM. The patient goes to bed at 9-10.30 PM , sometimes even earlier 8 PM- difficulties to fall asleep - "needs his TV " needs it to overturn the voices in his head, mind is racing- thinking about to -do -lists. and continues to sleep for 4-5 hours, wakes for  bathroom breaks, the first time at 2-3 AM.   The preferred sleep position is supine , right side , with the support of 1-2 pillows.  Dreams are reportedly frequent/vivid once he started Aricept- now in AM intake, less frequent dreams.   10-12 AM is the usual rise time.  The patient wakes up spontaneously- but he hurts too much to start his day. He  reports not feeling refreshed or restored in AM, with symptoms such as joint pain, multifocal, stiffness, dry mouth, morning headaches, and residual fatigue.  Naps are  taken infrequently, none.   Review of Systems: Out of a complete 14 system review, the patient complains of only the following symptoms, and all other reviewed systems are negative.:  Fatigue, sleepiness , snoring, fragmented sleep, Insomnia, delayed rise time.  Sleep hygiene - TV is on all night. He sleeps for up to 12 hours, no naps.    How likely are you to doze in the following situations: 0 = not likely, 1 = slight chance, 2 = moderate chance, 3 = high chance   Sitting and Reading? Watching Television? Sitting inactive in a  public place (theater or meeting)? As a passenger in a car for an hour without a break? Lying down in the afternoon when circumstances permit? Sitting and talking to someone? Sitting quietly after lunch without alcohol? In a car, while stopped for a few minutes in traffic?   Total = 03/ 24 points   FSS endorsed at 45/ 63 points.  GDS 3/ 15   Social History   Socioeconomic History   Marital status: Married    Spouse name: Brett Rosales   Number of children: 3   Years of education: Not on file   Highest education level: High school graduate  Occupational History   Not on file  Tobacco Use   Smoking status: Former    Packs/day: 1.00    Years: 15.00    Pack years: 15.00    Types: Cigarettes    Quit date: 01/29/1986    Years since quitting: 34.9   Smokeless tobacco: Never  Vaping Use   Vaping Use: Never used  Substance and Sexual Activity   Alcohol use: No    Alcohol/week: 0.0 standard drinks   Drug use: No   Sexual activity: Not on file  Other Topics Concern   Not on file  Social History Narrative   Lives with wife   Right handed   Caffeine: occa drinks coffee, no energy drink. Ginger tea   Social Determinants of Health   Financial Resource Strain: Not on file  Food Insecurity: Not on file  Transportation Needs: Not on file  Physical Activity: Not on file  Stress: Not on file  Social Connections: Not on file    Family History  Problem Relation Age of Onset   Diabetes Mother    Cancer Mother    Hypertension Father    Diabetes Paternal Grandmother     Past Medical History:  Diagnosis Date   Acute foreign body of ear canal 11/12/2015   Anxiety    medication made him feel flat stopped meds 09/07/2018   Arthritis    Ascending aortic aneurysm 12/23/2017   Bilateral chronic knee pain 08/14/2019   BMI 39.0-39.9,adult 3/66/2947   Complication of anesthesia    Knee block didn't take in 7/21 lt knee   Depression    Dyslipidemia 01/23/2019   Dyspnea on exertion 12/09/2017    Dysrhythmia 11/2017   a flutter   Essential hypertension 12/09/2017   History of kidney stones    13 stones   Hypertension    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   Rheumatoid arthritis ([place]) 12/09/2017   S/P TKR (total knee replacement) using cement, 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 it was inconclusive. needs follow up   Supraventricular tachycardia ([place]) 12/09/2017   Swelling of left lower extremity 05/17/2018   Typical atrial flutter ([place]) 12/16/2017    Past  Surgical History:  Procedure Laterality Date   TONSILLECTOMY AND ADENOIDECTOMY     TOTAL HIP ARTHROPLASTY Left 09/21/2018   Procedure: TOTAL HIP ARTHROPLASTY ANTERIOR APPROACH;  Surgeon: Rod Can, MD;  [facility];  Service: Orthopedics;  Laterality: Left;   TOTAL KNEE ARTHROPLASTY Left 09/24/2019   Procedure: TOTAL KNEE ARTHROPLASTY;  Surgeon: Vickey Huger, MD;  [facility];  Service: Orthopedics;  Laterality: Left;   TOTAL KNEE ARTHROPLASTY Right 02/04/2020   Procedure: TOTAL KNEE ARTHROPLASTY;  Surgeon: Vickey Huger, MD;  [facility];  Service: Orthopedics;  Laterality: Right;   VASECTOMY  1990     Current Outpatient Medications on File Prior to Visit  Medication Sig Dispense Refill   B Complex Vitamins (VITAMIN B COMPLEX) TABS Take 1 tablet by mouth daily. (Patient not taking: No sig reported)     Cholecalciferol (VITAMIN D3 PO) Take 2,000 Units by mouth daily.     Docusate Sodium (DSS) 100 MG CAPS Take 100 mg by mouth in the morning and at bedtime.     donepezil (ARICEPT) 10 MG tablet Take 1 tablet (10 mg total) by mouth daily. 90 tablet 4   DULoxetine (CYMBALTA) 30 MG capsule Take 30 mg by mouth daily.     ezetimibe (ZETIA) 10 MG tablet Take 10 mg by mouth daily.     folic acid (FOLVITE) 1 MG tablet Take 1 mg by mouth daily.     inFLIXimab (REMICADE IV) Inject 1 vial into the vein See  admin instructions. Every 6 weeks/ Unknown strenght     losartan (COZAAR) 100 MG tablet Take 100 mg by mouth daily.     methotrexate 50 MG/2ML injection Inject 25 mg into the skin once a week.      methylphenidate (RITALIN) 20 MG tablet Take 40 mg by mouth daily.      metoprolol tartrate (LOPRESSOR) 25 MG tablet TAKE 1 TABLET BY MOUTH TWICE DAILY. (Patient taking differently: Take 25 mg by mouth 2 (two) times daily.) 180 tablet 3   NON FORMULARY Place 1 drop into both eyes as needed. For eye sight     rivaroxaban (XARELTO) 20 MG TABS tablet TAKE 1 TABLET BY MOUTH DAILY WITH SUPPER 180 tablet 1   UNABLE TO FIND Take 1 tablet by mouth daily. Med Name: Lecithin/ Unknown strenght     No current facility-administered medications on file prior to visit.    Allergies  Allergen Reactions   Nsaids Other (See Comments)    "Eats hole in stomach".Marland KitchenMarland KitchenMarland KitchenIbuprofen, Meloxicam...   Statins Other (See Comments)    Myalgias    Physical exam:  Today's Vitals   01/01/21 1040  BP: (!) 143/78  Pulse: (!) 53  Weight: 280 lb 8 oz (127.2 kg)  Height: 6\' 2"  (1.88 m)   Body mass index is 36.01 kg/m.   Wt Readings from Last 3 Encounters:  01/01/21 280 lb 8 oz (127.2 kg)  09/11/20 289 lb 9.6 oz (131.4 kg)  07/29/20 289 lb 8 oz (131.3 kg)     Ht Readings from Last 3 Encounters:  01/01/21 6\' 2"  (1.88 m)  09/11/20 6\' 2"  (1.88 m)  07/29/20 6\' 1"  (1.854 m)      General: The patient is awake, alert and appears not in acute distress. The patient is well groomed. Head: Normocephalic, atraumatic. Neck is supple. Mallampati 1,  neck circumference:18.25 inches . Nasal airflow patent.  Retrognathia is mildly present, bruxism marks seen.  Dental status: bological  Cardiovascular:  Regular rate and cardiac  rhythm by pulse,  without distended neck veins. Respiratory: Lungs are clear to auscultation.  Skin:  Without evidence of ankle edema, or rash. Trunk: The patient's posture is erect.   Neurologic exam  : The patient is awake and alert, oriented to place and time.   Memory subjective described as intact.  Attention span & concentration ability appears normal.  Speech is fluent,  without  dysarthria, dysphonia or aphasia.  Mood and affect are appropriate.   Cranial nerves: no loss of smell or taste reported  Pupils are equal in size but not reactive to light. Funduscopic exam deferred. Pin point pupils. Status cataract surgery.  Extraocular movements in vertical and horizontal planes were intact and without nystagmus. No Diplopia. Visual fields by finger perimetry are intact. Hearing was intact to soft voice and finger rubbing.    Facial sensation intact to fine touch.  Facial motor strength is symmetric and tongue and uvula move midline.  Neck ROM : rotation, tilt and flexion extension were normal for age and shoulder shrug was symmetrical.    Motor exam:  Symmetric bulk, tone and ROM.   Normal tone without cog-wheeling, poor grip strength due to pain on the right, and due to inability to provide strength on the left. Carpal. .   Sensory:  Fine touch and vibration were normal.  Proprioception tested in the upper extremities was normal. Coordination: Rapid alternating movements in the fingers/hands were of normal speed.  The Finger-to-nose maneuver was intact without evidence of ataxia, dysmetria or tremor. Gait and station: Patient could rise unassisted from a seated position, walked without assistive device.  Deep tendon reflexes:  deferred .  After spending a total time of  50  minutes face to face and additional time for physical and neurologic examination, review of laboratory studies,  personal review of imaging studies, reports and results of other testing and review of referral information / records as far as provided in visit, I have established the following assessments:  1) patient with longstanding snoring and apnea - witnessed by spouse.  2) bradycardia and history of  atrial flutter-  2) reports not being excessively daytime sleepy, but sleeps 10-12 hours nightly- long sleep time.  3) Aricept for MCI, MOCA test, MMSE 23/30. Aricept  increased REM sleep pressure.    My Plan is to proceed with:  1) attended sleep study BED 2- offer  sleep aid.  2) looking for a cardiac arrhythmia at night. For REM BD, and for sleep apnea as well.  3) PLAN B; HST.   I would like to thank Ronita Hipps, MD and Marcial Pacas, [address] [place],  [place] [address] for allowing me to meet with and to take care of this pleasant patient.   In short, Brett Rosales is presenting with witnessed apnea, history of Memory loss, and chronic pain.   Electronically signed by: Larey Seat, MD 01/01/2021 11:13 AM  [facility] Board certified by The [employer] of Sleep Medicine and Diplomate of the [employer] of Sleep Medicine. Board certified In Neurology through the [place], Fellow of the [employer] of Neurology. Medical Director of [facility].

## 2021-01-01 NOTE — Patient Instructions (Signed)
Quality Sleep Information, Adult Quality sleep is important for your mental and physical health. It also improves your quality of life. Quality sleep means you: Are asleep for most of the time you are in bed. Fall asleep within 30 minutes. Wake up no more than once a night.  Are awake for no longer than 20 minutes if you do wake up during the night. Most adults need 7-8 hours of quality sleep each night. How can poor sleep affect me? If you do not get enough quality sleep, you may have: Mood swings. Daytime sleepiness. Confusion. Decreased reaction time. Sleep disorders, such as insomnia and sleep apnea. Difficulty with: Solving problems. Coping with stress. Paying attention. These issues may affect your performance and productivity at work, school, and at home. Lack of sleep may also put you at higher risk for accidents, suicide, and risky behaviors. If you do not get quality sleep you may also be at higher risk for several health problems, including: Infections. Type 2 diabetes. Heart disease. High blood pressure. Obesity. Worsening of long-term conditions, like arthritis, kidney disease, depression, Parkinson's disease, and epilepsy. What actions can I take to get more quality sleep?   Stick to a sleep schedule. Go to sleep and wake up at about the same time each day. Do not try to sleep less on weekdays and make up for lost sleep on weekends. This does not work. Try to get about 30 minutes of exercise on most days. Do not exercise 2-3 hours before going to bed. Limit naps during the day to 30 minutes or less. Do not use any products that contain nicotine or tobacco, such as cigarettes or e-cigarettes. If you need help quitting, ask your health care provider. Do not drink caffeinated beverages for at least 8 hours before going to bed. Coffee, tea, and some sodas contain caffeine. Do not drink alcohol close to bedtime. Do not eat large meals close to bedtime. Do not take naps in  the late afternoon. Try to get at least 30 minutes of sunlight every day. Morning sunlight is best. Make time to relax before bed. Reading, listening to music, or taking a hot bath promotes quality sleep. Make your bedroom a place that promotes quality sleep. Keep your bedroom dark, quiet, and at a comfortable room temperature. Make sure your bed is comfortable. Take out sleep distractions like TV, a computer, smartphone, and bright lights. If you are lying awake in bed for longer than 20 minutes, get up and do a relaxing activity until you feel sleepy. Work with your health care provider to treat medical conditions that may affect sleeping, such as: Nasal obstruction. Snoring. Sleep apnea and other sleep disorders. Talk to your health care provider if you think any of your prescription medicines may cause you to have difficulty falling or staying asleep. If you have sleep problems, talk with a sleep consultant. If you think you have a sleep disorder, talk with your health care provider about getting evaluated by a specialist. Where to find more information National Sleep Foundation website: https://sleepfoundation.org National Heart, Lung, and Blood Institute (NHLBI): www.nhlbi.nih.gov/files/docs/public/sleep/healthy_sleep.pdf Centers for Disease Control and Prevention (CDC): www.cdc.gov/sleep/index.html Contact a health care provider if you: Have trouble getting to sleep or staying asleep. Often wake up very early in the morning and cannot get back to sleep. Have daytime sleepiness. Have daytime sleep attacks of suddenly falling asleep and sudden muscle weakness (narcolepsy). Have a tingling sensation in your legs with a strong urge to move your legs (restless   legs syndrome). Stop breathing briefly during sleep (sleep apnea). Think you have a sleep disorder or are taking a medicine that is affecting your quality of sleep. Summary Most adults need 7-8 hours of quality sleep each  night. Getting enough quality sleep is an important part of health and well-being. Make your bedroom a place that promotes quality sleep and avoid things that may cause you to have poor sleep, such as alcohol, caffeine, smoking, and large meals. Talk to your health care provider if you have trouble falling asleep or staying asleep. This information is not intended to replace advice given to you by your health care provider. Make sure you discuss any questions you have with your health care provider. Document Revised: 06/01/2017 Document Reviewed: 06/01/2017 Elsevier Patient Education  2022 [place]. Insomnia Insomnia is a sleep disorder that makes it difficult to fall asleep or stay asleep. Insomnia can cause fatigue, low energy, difficulty concentrating, mood swings, and poor performance at work or school. There are three different ways to classify insomnia: Difficulty falling asleep. Difficulty staying asleep. Waking up too early in the morning. Any type of insomnia can be long-term (chronic) or short-term (acute). Both are common. Short-term insomnia usually lasts for three months or less. Chronic insomnia occurs at least three times a week for longer than three months. What are the causes? Insomnia may be caused by another condition, situation, or substance, such as: Anxiety. Certain medicines. Gastroesophageal reflux disease (GERD) or other gastrointestinal conditions. Asthma or other breathing conditions. Restless legs syndrome, sleep apnea, or other sleep disorders. Chronic pain. Menopause. Stroke. Abuse of alcohol, tobacco, or illegal drugs. Mental health conditions, such as depression. Caffeine. Neurological disorders, such as Alzheimer's disease. An overactive thyroid (hyperthyroidism). Sometimes, the cause of insomnia may not be known. What increases the risk? Risk factors for insomnia include: Gender. Women are affected more often than men. Age. Insomnia is more  common as you get older. Stress. Lack of exercise. Irregular work schedule or working night shifts. Traveling between different time zones. Certain medical and mental health conditions. What are the signs or symptoms? If you have insomnia, the main symptom is having trouble falling asleep or having trouble staying asleep. This may lead to other symptoms, such as: Feeling fatigued or having low energy. Feeling nervous about going to sleep. Not feeling rested in the morning. Having trouble concentrating. Feeling irritable, anxious, or depressed. How is this diagnosed? This condition may be diagnosed based on: Your symptoms and medical history. Your health care provider may ask about: Your sleep habits. Any medical conditions you have. Your mental health. A physical exam. How is this treated? Treatment for insomnia depends on the cause. Treatment may focus on treating an underlying condition that is causing insomnia. Treatment may also include: Medicines to help you sleep. Counseling or therapy. Lifestyle adjustments to help you sleep better. Follow these instructions at home: Eating and drinking  Limit or avoid alcohol, caffeinated beverages, and cigarettes, especially close to bedtime. These can disrupt your sleep. Do not eat a large meal or eat spicy foods right before bedtime. This can lead to digestive discomfort that can make it hard for you to sleep. Sleep habits  Keep a sleep diary to help you and your health care provider figure out what could be causing your insomnia. Write down: When you sleep. When you wake up during the night. How well you sleep. How rested you feel the next day. Any side effects of medicines you are taking. What you  eat and drink. Make your bedroom a dark, comfortable place where it is easy to fall asleep. Put up shades or blackout curtains to block light from outside. Use a white noise machine to block noise. Keep the temperature cool. Limit  screen use before bedtime. This includes: Watching TV. Using your smartphone, tablet, or computer. Stick to a routine that includes going to bed and waking up at the same times every day and night. This can help you fall asleep faster. Consider making a quiet activity, such as reading, part of your nighttime routine. Try to avoid taking naps during the day so that you sleep better at night. Get out of bed if you are still awake after 15 minutes of trying to sleep. Keep the lights down, but try reading or doing a quiet activity. When you feel sleepy, go back to bed. General instructions Take over-the-counter and prescription medicines only as told by your health care provider. Exercise regularly, as told by your health care provider. Avoid exercise starting several hours before bedtime. Use relaxation techniques to manage stress. Ask your health care provider to suggest some techniques that may work well for you. These may include: Breathing exercises. Routines to release muscle tension. Visualizing peaceful scenes. Make sure that you drive carefully. Avoid driving if you feel very sleepy. Keep all follow-up visits as told by your health care provider. This is important. Contact a health care provider if: You are tired throughout the day. You have trouble in your daily routine due to sleepiness. You continue to have sleep problems, or your sleep problems get worse. Get help right away if: You have serious thoughts about hurting yourself or someone else. If you ever feel like you may hurt yourself or others, or have thoughts about taking your own life, get help right away. You can go to your nearest emergency department or call: Your local emergency services (911 in the [place] A suicide crisis helpline, such as the [facility] at 205-816-2765. This is open 24 hours a day. Summary Insomnia is a sleep disorder that makes it difficult to fall asleep or stay  asleep. Insomnia can be long-term (chronic) or short-term (acute). Treatment for insomnia depends on the cause. Treatment may focus on treating an underlying condition that is causing insomnia. Keep a sleep diary to help you and your health care provider figure out what could be causing your insomnia. This information is not intended to replace advice given to you by your health care provider. Make sure you discuss any questions you have with your health care provider. Document Revised: 01/03/2020 Document Reviewed: 01/03/2020 Elsevier Patient Education  2022 [employer].

## 2021-01-05 DIAGNOSIS — E7800 Pure hypercholesterolemia, unspecified: Secondary | ICD-10-CM | POA: Diagnosis not present

## 2021-01-05 DIAGNOSIS — I10 Essential (primary) hypertension: Secondary | ICD-10-CM | POA: Diagnosis not present

## 2021-01-05 DIAGNOSIS — F988 Other specified behavioral and emotional disorders with onset usually occurring in childhood and adolescence: Secondary | ICD-10-CM | POA: Diagnosis not present

## 2021-01-06 DIAGNOSIS — M25512 Pain in left shoulder: Secondary | ICD-10-CM | POA: Diagnosis not present

## 2021-01-06 DIAGNOSIS — M75122 Complete rotator cuff tear or rupture of left shoulder, not specified as traumatic: Secondary | ICD-10-CM | POA: Diagnosis not present

## 2021-01-07 ENCOUNTER — Other Ambulatory Visit: Payer: Self-pay | Admitting: Cardiology

## 2021-01-08 ENCOUNTER — Telehealth: Payer: Self-pay

## 2021-01-08 DIAGNOSIS — Z79899 Other long term (current) drug therapy: Secondary | ICD-10-CM | POA: Diagnosis not present

## 2021-01-08 DIAGNOSIS — M0609 Rheumatoid arthritis without rheumatoid factor, multiple sites: Secondary | ICD-10-CM | POA: Diagnosis not present

## 2021-01-08 NOTE — Telephone Encounter (Signed)
LVM for pt to call me back to schedule sleep study  

## 2021-01-12 ENCOUNTER — Telehealth: Payer: Self-pay | Admitting: Neurology

## 2021-01-12 DIAGNOSIS — M75102 Unspecified rotator cuff tear or rupture of left shoulder, not specified as traumatic: Secondary | ICD-10-CM | POA: Diagnosis not present

## 2021-01-12 DIAGNOSIS — M25512 Pain in left shoulder: Secondary | ICD-10-CM | POA: Diagnosis not present

## 2021-01-12 NOTE — Telephone Encounter (Signed)
Pt. Was left a voice mail to schedule their sleep study and was advised to call back to schedule.

## 2021-01-13 ENCOUNTER — Telehealth: Payer: Self-pay | Admitting: Cardiology

## 2021-01-13 NOTE — Telephone Encounter (Signed)
Spoke to patient. He was inquiring about a surgical clearance. Informed hi we do not have one yet in our system. Confirmed the correct fax numbers with him so he can get it sent here and we can get it sent to the preop poole.

## 2021-01-13 NOTE — Telephone Encounter (Signed)
  Pt is requesting a call back from [facility], he said he has some few questions to ask her

## 2021-01-16 DIAGNOSIS — Z79899 Other long term (current) drug therapy: Secondary | ICD-10-CM | POA: Diagnosis not present

## 2021-01-16 DIAGNOSIS — Z01818 Encounter for other preprocedural examination: Secondary | ICD-10-CM | POA: Diagnosis not present

## 2021-01-16 DIAGNOSIS — E8881 Metabolic syndrome: Secondary | ICD-10-CM | POA: Diagnosis not present

## 2021-01-16 DIAGNOSIS — Z6837 Body mass index (BMI) 37.0-37.9, adult: Secondary | ICD-10-CM | POA: Diagnosis not present

## 2021-01-16 DIAGNOSIS — Z23 Encounter for immunization: Secondary | ICD-10-CM | POA: Diagnosis not present

## 2021-01-19 ENCOUNTER — Telehealth: Payer: Self-pay | Admitting: Cardiology

## 2021-01-19 ENCOUNTER — Telehealth: Payer: Self-pay | Admitting: Neurology

## 2021-01-19 NOTE — Telephone Encounter (Signed)
Patient with diagnosis of atrial flutter on Xarelto for anticoagulation.    Procedure: Left shoulder rotater cup repair with bispety tenotomy  Date of procedure: 01/22/21   CHA2DS2-VASc Score = 2   This indicates a 2.2% annual risk of stroke. The patient's score is based upon: CHF History: 0 HTN History: 1 Diabetes History: 0 Stroke History: 0 Vascular Disease History: 0 Age Score: 1 Gender Score: 0   CrCl 77 Platelet count 247  Per office protocol, patient can hold Xarelto for 2 days prior to procedure.   Patient will not need bridging with Lovenox (enoxaparin) around procedure.  For orthopedic procedures please be sure to resume therapeutic (not prophylactic) dosing.

## 2021-01-19 NOTE — Telephone Encounter (Signed)
   [facility] Pre-operative Risk Assessment    Request for surgical clearance:  What type of surgery is being performed? Left shoulder rotater cup repair with bispety tenotomy   When is this surgery scheduled? 11/17/222   What type of clearance is required (medical clearance vs. Pharmacy clearance to hold med vs. Both)? Medical  Are there any medications that need to be held prior to surgery and how long?rivaroxaban (XARELTO) 20 MG TABS tablet   Practice name and name of physician performing surgery? Dr Lorin Mercy   What is your office phone number [phone]    7.   What is your office fax number [phone]  8.   Anesthesia type (None, local, MAC, general) ? General and local   Milbert Coulter 01/19/2021, 11:58 AM  _________________________________________________________________   (provider comments below)

## 2021-01-19 NOTE — Telephone Encounter (Signed)
   Name: Brett Rosales  DOB: 1945-11-23  MRN: [id number]   Primary Cardiologist: Jenne Campus, MD  Chart reviewed as part of pre-operative protocol coverage. Patient was contacted 01/19/2021 in reference to pre-operative risk assessment for pending surgery as outlined below.  Brett Rosales was last seen on 09/2020 by Dr. Agustin Cree. History reviewed. He has history of atrial flutter and ascending thoracic aneurysm. Regarding the latter, per Dr. Agustin Cree he felt this was stable and he planned to re-evaluate in 6 months. Per his interpretation on 01/2020 echo he felt this was mild.  I reached out to patient for update on how he is doing. The patient affirms he has been doing well without any new cardiac symptoms. He is able to achieve >4 METS regularly without CP or SOB - very active in yardwork and other physical activities. Therefore, based on ACC/AHA guidelines, the patient would be at acceptable risk for the planned procedure without further cardiovascular testing. The patient was advised that if he develops new symptoms prior to surgery to contact our office to arrange for a follow-up visit, and he verbalized understanding.  I did note on prior bloodwork the patient's potassium level was elevated in 01/2021. Per [facility] portal this was rechecked 05/2020 and it was normal at 4.7. The patient states his PCP Dr. Helene Kelp recently did bloodwork as well and faxed over the results to surgeon so would just recommend attention to making sure potassium level was normal at that time since we do not have access to those labs. If abnormal, would involve primary care provider as they prescribe the patient's losartan.  Regarding anticoagulation, "Per office protocol, patient can hold Xarelto for 2 days prior to procedure.   Patient will not need bridging with Lovenox (enoxaparin) around procedure. For orthopedic procedures please be sure to resume therapeutic (not prophylactic) dosing." We typically advise that blood  thinners be resumed when felt safe by performing physician. Patient is aware to hold Xarelto as requested.  I will route this recommendation to the requesting party via Epic fax function and remove from pre-op pool. Please call with questions.  Charlie Pitter, PA-C 01/19/2021, 3:42 PM

## 2021-01-19 NOTE — Telephone Encounter (Signed)
We have attempted to call the patient 2 times to schedule sleep study. Patient has been unavailable at the phone numbers we have on file and has not returned our calls. If patient calls back we will schedule them for their sleep study. ° °

## 2021-01-19 NOTE — Telephone Encounter (Signed)
Covering preop today, will route to pharm with priority then pt will need call. Last Ov 09/2020 and doing well at that time.

## 2021-01-22 DIAGNOSIS — M25512 Pain in left shoulder: Secondary | ICD-10-CM | POA: Diagnosis not present

## 2021-01-22 DIAGNOSIS — M24112 Other articular cartilage disorders, left shoulder: Secondary | ICD-10-CM | POA: Diagnosis not present

## 2021-01-22 DIAGNOSIS — Z7901 Long term (current) use of anticoagulants: Secondary | ICD-10-CM | POA: Diagnosis not present

## 2021-01-22 DIAGNOSIS — M65812 Other synovitis and tenosynovitis, left shoulder: Secondary | ICD-10-CM | POA: Diagnosis not present

## 2021-01-22 DIAGNOSIS — G8918 Other acute postprocedural pain: Secondary | ICD-10-CM | POA: Diagnosis not present

## 2021-01-22 DIAGNOSIS — Z87891 Personal history of nicotine dependence: Secondary | ICD-10-CM | POA: Diagnosis not present

## 2021-01-22 DIAGNOSIS — M7552 Bursitis of left shoulder: Secondary | ICD-10-CM | POA: Diagnosis not present

## 2021-01-22 DIAGNOSIS — M7522 Bicipital tendinitis, left shoulder: Secondary | ICD-10-CM | POA: Diagnosis not present

## 2021-01-22 DIAGNOSIS — M75122 Complete rotator cuff tear or rupture of left shoulder, not specified as traumatic: Secondary | ICD-10-CM | POA: Diagnosis not present

## 2021-01-22 DIAGNOSIS — M7502 Adhesive capsulitis of left shoulder: Secondary | ICD-10-CM | POA: Diagnosis not present

## 2021-01-22 DIAGNOSIS — M75102 Unspecified rotator cuff tear or rupture of left shoulder, not specified as traumatic: Secondary | ICD-10-CM | POA: Diagnosis not present

## 2021-01-26 DIAGNOSIS — M25512 Pain in left shoulder: Secondary | ICD-10-CM | POA: Diagnosis not present

## 2021-01-26 DIAGNOSIS — M62522 Muscle wasting and atrophy, not elsewhere classified, left upper arm: Secondary | ICD-10-CM | POA: Diagnosis not present

## 2021-01-28 DIAGNOSIS — M62522 Muscle wasting and atrophy, not elsewhere classified, left upper arm: Secondary | ICD-10-CM | POA: Diagnosis not present

## 2021-01-28 DIAGNOSIS — M25512 Pain in left shoulder: Secondary | ICD-10-CM | POA: Diagnosis not present

## 2021-02-04 DIAGNOSIS — M62522 Muscle wasting and atrophy, not elsewhere classified, left upper arm: Secondary | ICD-10-CM | POA: Diagnosis not present

## 2021-02-04 DIAGNOSIS — M25512 Pain in left shoulder: Secondary | ICD-10-CM | POA: Diagnosis not present

## 2021-02-11 ENCOUNTER — Encounter: Payer: Self-pay | Admitting: Neurology

## 2021-02-11 ENCOUNTER — Ambulatory Visit (INDEPENDENT_AMBULATORY_CARE_PROVIDER_SITE_OTHER): Payer: Medicare Other | Admitting: Neurology

## 2021-02-11 ENCOUNTER — Telehealth: Payer: Self-pay | Admitting: Neurology

## 2021-02-11 VITALS — BP 135/76 | HR 53 | Ht 74.0 in | Wt 279.0 lb

## 2021-02-11 DIAGNOSIS — G4739 Other sleep apnea: Secondary | ICD-10-CM | POA: Diagnosis not present

## 2021-02-11 DIAGNOSIS — M25512 Pain in left shoulder: Secondary | ICD-10-CM | POA: Diagnosis not present

## 2021-02-11 DIAGNOSIS — F09 Unspecified mental disorder due to known physiological condition: Secondary | ICD-10-CM | POA: Diagnosis not present

## 2021-02-11 DIAGNOSIS — M62522 Muscle wasting and atrophy, not elsewhere classified, left upper arm: Secondary | ICD-10-CM | POA: Diagnosis not present

## 2021-02-11 NOTE — Patient Instructions (Signed)
Continue the Aricept medication  Will get setup for sleep study  See you back in 6 months

## 2021-02-11 NOTE — Progress Notes (Signed)
PATIENT: Brett Rosales DOB: Apr 15, 1945  REASON FOR VISIT: Follow up HISTORY FROM: Patient PRIMARY NEUROLOGIST: Dr. Krista Blue, Dr. Brett Fairy for sleep consult  HISTORY  07/29/2020 Dr. Krista Blue: Brett Rosales, is a 75 year old male, seen in request by his primary care physician Dr. Helene Kelp, Abagail Kitchens for evaluation of memory loss, excessive fatigue, he is accompanied by his wife at today's visit on Jul 30, 2020   I reviewed and summarized the referring note. PMHX. 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, has multiple degenerative joint disease, chronic low back pain, has been treated as rheumatoid arthritis for many years, use different medications, including Remicade, methotrexate, also had a history of atrial fibrillation, on chronic Xarelto   He was noted to have gradual onset of memory loss since his multiple joint replacement surgery in 2020, he had left hip replacement in 2020, left knee replacement in summer 2021, and right knee replacement in November 2021   Following all those general anesthesia, he was noted to have some changes, he often forgets to clean himself up after project, he used to be very good with direction, now often misses a turn when driving,   There was no family history of dementia, he was put on Aricept 5 mg few months ago, while he was taking every night, he complains of excessive dreams, improved after move the dosage to every morning, he is independent in his daily activity, MoCA examination is 23/30 today   Because of his multiple joints pain, he did not exercise regularly, did have weight gain, used to have loud snoring, sometimes up in the middle of the night catching his breath  Update February 11, 2021 SS: Had rotator cuff surgery 3 weeks ago, on the left arm. Saw Dr.  Brett Fairy to have sleep study, didn't schedule yet.  B12, RPR, TSH were normal May 2022.  MRI of the brain in July 2022 showed no acute abnormality, mild age-related atrophy SVD, moderate paranasal sinus disease  Taking Aricept 10 mg AM, noticed improvement reported by his wife, no more side effects. He is remembering things better, not asking as much repetitive questioning. Is driving, not getting lost. In stressful situations, can't process what he needs to do as quickly as before, will start stuttering, gets frustrated. They are building a house which is stressful.   MOCA today was 28/30.  REVIEW OF SYSTEMS: Out of a complete 14 system review of symptoms, the patient complains only of the following symptoms, and all other reviewed systems are negative.  See HPI  ALLERGIES: Allergies  Allergen Reactions   Nsaids Other (See Comments)    "Eats hole in stomach".Marland KitchenMarland KitchenMarland KitchenIbuprofen, Meloxicam...   Statins Other (See Comments)    Myalgias    HOME MEDICATIONS: Outpatient Medications Prior to Visit  Medication Sig Dispense Refill   B Complex Vitamins (VITAMIN B COMPLEX) TABS Take 1 tablet by mouth daily.     Cholecalciferol (VITAMIN D3 PO) Take 2,000 Units by mouth daily.     Docusate Sodium (DSS) 100 MG CAPS Take 100 mg by mouth in the morning and at bedtime.     donepezil (ARICEPT) 10 MG tablet Take 1 tablet (10 mg total) by mouth daily. 90 tablet 4   DULoxetine (CYMBALTA) 30 MG capsule Take 30 mg by mouth daily.  ezetimibe (ZETIA) 10 MG tablet Take 10 mg by mouth daily.     folic acid (FOLVITE) 1 MG tablet Take 1 mg by mouth daily.     inFLIXimab (REMICADE IV) Inject 1 vial into the vein See admin instructions. Every 6 weeks/ Unknown strenght     losartan (COZAAR) 100 MG tablet Take 100 mg by mouth daily.     methotrexate 50 MG/2ML injection Inject 25 mg into the skin once a week.      methylphenidate (RITALIN) 20 MG tablet Take 40 mg by mouth daily.      metoprolol tartrate (LOPRESSOR)  25 MG tablet TAKE 1 TABLET BY MOUTH TWICE DAILY. 180 tablet 2   NON FORMULARY Place 1 drop into both eyes as needed. For eye sight     rivaroxaban (XARELTO) 20 MG TABS tablet TAKE 1 TABLET BY MOUTH DAILY WITH SUPPER 180 tablet 1   UNABLE TO FIND Take 1 tablet by mouth daily. Med Name: Lecithin/ Unknown strenght     No facility-administered medications prior to visit.    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 it was inconclusive. needs follow up   Supraventricular tachycardia ([place]) 12/09/2017   Swelling of left lower extremity 05/17/2018   Typical atrial flutter ([place]) 12/16/2017    PAST SURGICAL HISTORY: Past Surgical History:  Procedure Laterality Date   TONSILLECTOMY AND ADENOIDECTOMY     TOTAL HIP ARTHROPLASTY Left 09/21/2018   Procedure: TOTAL HIP ARTHROPLASTY ANTERIOR APPROACH;  Surgeon: Rod Can, MD;  Location: [facility];  Service: Orthopedics;  Laterality: Left;   TOTAL KNEE ARTHROPLASTY Left 09/24/2019   Procedure: TOTAL KNEE ARTHROPLASTY;  Surgeon: Vickey Huger, MD;  Location: [facility];  Service: Orthopedics;  Laterality: Left;   TOTAL KNEE ARTHROPLASTY Right 02/04/2020   Procedure:  TOTAL KNEE ARTHROPLASTY;  Surgeon: Vickey Huger, MD;  Location: [facility];  Service: Orthopedics;  Laterality: Right;   VASECTOMY  1990    FAMILY HISTORY: Family History  Problem Relation Age of Onset   Diabetes Mother    Cancer Mother    Hypertension Father    Diabetes Paternal Grandmother     SOCIAL HISTORY: Social History   Socioeconomic History   Marital status: Married    Spouse name: [name]   Number of children: 3   Years of education: Not on file   Highest education level: High school graduate  Occupational History   Not on file  Tobacco Use   Smoking status: Former    Packs/day: 1.00    Years: 15.00    Pack years: 15.00    Types: Cigarettes  Quit date: 01/29/1986    Years since quitting: 35.0   Smokeless tobacco: Never  Vaping Use   Vaping Use: Never used  Substance and Sexual Activity   Alcohol use: No    Alcohol/week: 0.0 standard drinks   Drug use: No   Sexual activity: Not on file  Other Topics Concern   Not on file  Social History Narrative   Lives with wife   Right handed   Caffeine: occa drinks coffee, no energy drink. Ginger tea   Social Determinants of Radio broadcast assistant Strain: Not on file  Food Insecurity: Not on file  Transportation Needs: Not on file  Physical Activity: Not on file  Stress: Not on file  Social Connections: Not on file  Intimate Partner Violence: Not on file    Montreal Cognitive Assessment  07/29/2020  Visuospatial/ Executive (0/5) 4  Naming (0/3) 2  Attention: Read list of digits (0/2) 2  Attention: Read list of letters (0/1) 0  Attention: Serial 7 subtraction starting at 100 (0/3) 3  Language: Repeat phrase (0/2) 2  Language : Fluency (0/1) 1  Abstraction (0/2) 2  Delayed Recall (0/5) 1  Orientation (0/6) 6  Total 23   PHYSICAL EXAM  Vitals:   02/11/21 1416  BP: 135/76  Pulse: (!) 53  Weight: 279 lb (126.6 kg)  Height: 6\' 2"  (1.88 m)   Body mass index is 35.82 kg/m.  Generalized: Well  developed, in no acute distress, very pleasant Neurological examination  Mentation: Alert oriented to time, place, history taking. Follows all commands speech and language fluent Cranial nerve II-XII: Pupils were equal round reactive to light. Extraocular movements were full, visual field were full on confrontational test. Facial sensation and strength were normal.  Motor: Good strength but post op left upper extremity, wearing sling Sensory: Sensory testing is intact to soft touch on all 4 extremities. No evidence of extinction is noted.  Coordination: Cerebellar testing reveals good finger-nose-finger with right, left impaired due to immobilizer  Gait and station: Slow to stand, but steady unassisted  Reflexes: Deep tendon reflexes are symmetric but decreased throughout  DIAGNOSTIC DATA (LABS, IMAGING, TESTING) - I reviewed patient records, labs, notes, testing and imaging myself where available.  Lab Results  Component Value Date   WBC 5.3 01/30/2020   HGB 14.1 01/30/2020   HCT 44.8 01/30/2020   MCV 95.5 01/30/2020   PLT 247 01/30/2020      Component Value Date/Time   NA 140 01/30/2020 1142   K 5.2 (H) 01/30/2020 1142   CL 110 01/30/2020 1142   CO2 23 01/30/2020 1142   GLUCOSE 99 01/30/2020 1142   BUN 27 (H) 01/30/2020 1142   CREATININE 1.09 01/30/2020 1142   CALCIUM 9.0 01/30/2020 1142   PROT 6.9 01/30/2020 1142   ALBUMIN 3.9 01/30/2020 1142   AST 15 01/30/2020 1142   ALT 17 01/30/2020 1142   ALKPHOS 72 01/30/2020 1142   BILITOT 0.5 01/30/2020 1142   GFRNONAA >60 01/30/2020 1142   GFRAA >60 09/20/2019 1204   Lab Results  Component Value Date   CHOL 233 (H) 12/13/2019   HDL 88 12/13/2019   LDLCALC 123 (H) 12/13/2019   TRIG 130 12/13/2019   CHOLHDL 2.6 12/13/2019   No results found for: HGBA1C Lab Results  Component Value Date   VITAMINB12 360 07/29/2020   Lab Results  Component Value Date   TSH 2.320 07/29/2020    ASSESSMENT AND PLAN 75 y.o. 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 here with:  Mild cognitive impairment Excessive snoring, fatigue  -Will get set up for sleep study, sleep consultation in October 2022 with Dr. Brett Fairy   -Continue Aricept 10 mg daily, noted benefit, consider namenda in future, but wish to hold off for now until sleep study  -Belk test today was 28/30, better from May 2022 23/30 -MRI of the brain in July 2022 showed no acute abnormality, mild age-related atrophy SVD, moderate paranasal sinus disease -B12, TSH, RPR were normal -Follow-up in 6 months or sooner if needed, will follow memory, sleep results and subsequent treatment if indicated  I spent 32 minutes of face-to-face and non-face-to-face time with patient.  This included previsit chart review, lab review, study review, order entry, discussing medications, plan and follow up.  Butler Denmark, AGNP-C, DNP 02/11/2021, 2:24 PM [facility] [address] [place], [place] [address] [phone]

## 2021-02-11 NOTE — Telephone Encounter (Signed)
Please call the patient to schedule a sleep study post visit with Dr. Brett Fairy.

## 2021-02-12 ENCOUNTER — Telehealth: Payer: Self-pay | Admitting: Neurology
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
## 2021-02-16 NOTE — Telephone Encounter (Signed)
I called pt today to schedule his sleep study. Pt states he just had surgery on his arm and would like to call back when he has healed.

## 2021-02-18 DIAGNOSIS — M62522 Muscle wasting and atrophy, not elsewhere classified, left upper arm: Secondary | ICD-10-CM | POA: Diagnosis not present

## 2021-02-18 DIAGNOSIS — M25512 Pain in left shoulder: Secondary | ICD-10-CM | POA: Diagnosis not present

## 2021-02-20 DIAGNOSIS — Z111 Encounter for screening for respiratory tuberculosis: Secondary | ICD-10-CM | POA: Diagnosis not present

## 2021-02-20 DIAGNOSIS — M0609 Rheumatoid arthritis without rheumatoid factor, multiple sites: Secondary | ICD-10-CM | POA: Diagnosis not present

## 2021-02-20 DIAGNOSIS — R5383 Other fatigue: Secondary | ICD-10-CM | POA: Diagnosis not present

## 2021-02-20 DIAGNOSIS — Z79899 Other long term (current) drug therapy: Secondary | ICD-10-CM | POA: Diagnosis not present

## 2021-03-04 DIAGNOSIS — M25512 Pain in left shoulder: Secondary | ICD-10-CM | POA: Diagnosis not present

## 2021-03-04 DIAGNOSIS — M62522 Muscle wasting and atrophy, not elsewhere classified, left upper arm: Secondary | ICD-10-CM | POA: Diagnosis not present

## 2021-03-06 DIAGNOSIS — M62522 Muscle wasting and atrophy, not elsewhere classified, left upper arm: Secondary | ICD-10-CM | POA: Diagnosis not present

## 2021-03-06 DIAGNOSIS — M25512 Pain in left shoulder: Secondary | ICD-10-CM | POA: Diagnosis not present

## 2021-03-10 DIAGNOSIS — M25512 Pain in left shoulder: Secondary | ICD-10-CM | POA: Diagnosis not present

## 2021-03-10 DIAGNOSIS — M62522 Muscle wasting and atrophy, not elsewhere classified, left upper arm: Secondary | ICD-10-CM | POA: Diagnosis not present

## 2021-03-11 DIAGNOSIS — M62522 Muscle wasting and atrophy, not elsewhere classified, left upper arm: Secondary | ICD-10-CM | POA: Diagnosis not present

## 2021-03-11 DIAGNOSIS — M25512 Pain in left shoulder: Secondary | ICD-10-CM | POA: Diagnosis not present

## 2021-03-13 DIAGNOSIS — M25512 Pain in left shoulder: Secondary | ICD-10-CM | POA: Diagnosis not present

## 2021-03-13 DIAGNOSIS — M62522 Muscle wasting and atrophy, not elsewhere classified, left upper arm: Secondary | ICD-10-CM | POA: Diagnosis not present

## 2021-03-16 DIAGNOSIS — M25512 Pain in left shoulder: Secondary | ICD-10-CM | POA: Diagnosis not present

## 2021-03-16 DIAGNOSIS — M62522 Muscle wasting and atrophy, not elsewhere classified, left upper arm: Secondary | ICD-10-CM | POA: Diagnosis not present

## 2021-03-18 DIAGNOSIS — M62522 Muscle wasting and atrophy, not elsewhere classified, left upper arm: Secondary | ICD-10-CM | POA: Diagnosis not present

## 2021-03-18 DIAGNOSIS — M25512 Pain in left shoulder: Secondary | ICD-10-CM | POA: Diagnosis not present

## 2021-03-19 DIAGNOSIS — Z79899 Other long term (current) drug therapy: Secondary | ICD-10-CM | POA: Diagnosis not present

## 2021-03-19 DIAGNOSIS — M0609 Rheumatoid arthritis without rheumatoid factor, multiple sites: Secondary | ICD-10-CM | POA: Diagnosis not present

## 2021-03-19 DIAGNOSIS — E669 Obesity, unspecified: Secondary | ICD-10-CM | POA: Diagnosis not present

## 2021-03-19 DIAGNOSIS — Z6835 Body mass index (BMI) 35.0-35.9, adult: Secondary | ICD-10-CM | POA: Diagnosis not present

## 2021-03-19 DIAGNOSIS — M545 Low back pain, unspecified: Secondary | ICD-10-CM | POA: Diagnosis not present

## 2021-03-19 DIAGNOSIS — M150 Primary generalized (osteo)arthritis: Secondary | ICD-10-CM | POA: Diagnosis not present

## 2021-03-19 DIAGNOSIS — M2550 Pain in unspecified joint: Secondary | ICD-10-CM | POA: Diagnosis not present

## 2021-03-20 DIAGNOSIS — M62522 Muscle wasting and atrophy, not elsewhere classified, left upper arm: Secondary | ICD-10-CM | POA: Diagnosis not present

## 2021-03-20 DIAGNOSIS — M25512 Pain in left shoulder: Secondary | ICD-10-CM | POA: Diagnosis not present

## 2021-03-23 DIAGNOSIS — M62522 Muscle wasting and atrophy, not elsewhere classified, left upper arm: Secondary | ICD-10-CM | POA: Diagnosis not present

## 2021-03-23 DIAGNOSIS — M25512 Pain in left shoulder: Secondary | ICD-10-CM | POA: Diagnosis not present

## 2021-03-25 DIAGNOSIS — M62522 Muscle wasting and atrophy, not elsewhere classified, left upper arm: Secondary | ICD-10-CM | POA: Diagnosis not present

## 2021-03-25 DIAGNOSIS — M25512 Pain in left shoulder: Secondary | ICD-10-CM | POA: Diagnosis not present

## 2021-03-30 DIAGNOSIS — M62522 Muscle wasting and atrophy, not elsewhere classified, left upper arm: Secondary | ICD-10-CM | POA: Diagnosis not present

## 2021-03-30 DIAGNOSIS — M25512 Pain in left shoulder: Secondary | ICD-10-CM | POA: Diagnosis not present

## 2021-04-01 DIAGNOSIS — M62522 Muscle wasting and atrophy, not elsewhere classified, left upper arm: Secondary | ICD-10-CM | POA: Diagnosis not present

## 2021-04-01 DIAGNOSIS — M25512 Pain in left shoulder: Secondary | ICD-10-CM | POA: Diagnosis not present

## 2021-04-03 DIAGNOSIS — M25512 Pain in left shoulder: Secondary | ICD-10-CM | POA: Diagnosis not present

## 2021-04-03 DIAGNOSIS — M62522 Muscle wasting and atrophy, not elsewhere classified, left upper arm: Secondary | ICD-10-CM | POA: Diagnosis not present

## 2021-04-06 DIAGNOSIS — M0609 Rheumatoid arthritis without rheumatoid factor, multiple sites: Secondary | ICD-10-CM | POA: Diagnosis not present

## 2021-04-06 DIAGNOSIS — M62522 Muscle wasting and atrophy, not elsewhere classified, left upper arm: Secondary | ICD-10-CM | POA: Diagnosis not present

## 2021-04-06 DIAGNOSIS — M25512 Pain in left shoulder: Secondary | ICD-10-CM | POA: Diagnosis not present

## 2021-04-06 DIAGNOSIS — Z79899 Other long term (current) drug therapy: Secondary | ICD-10-CM | POA: Diagnosis not present

## 2021-04-10 DIAGNOSIS — M62522 Muscle wasting and atrophy, not elsewhere classified, left upper arm: Secondary | ICD-10-CM | POA: Diagnosis not present

## 2021-04-10 DIAGNOSIS — M25512 Pain in left shoulder: Secondary | ICD-10-CM | POA: Diagnosis not present

## 2021-04-15 DIAGNOSIS — M62522 Muscle wasting and atrophy, not elsewhere classified, left upper arm: Secondary | ICD-10-CM | POA: Diagnosis not present

## 2021-04-15 DIAGNOSIS — M25512 Pain in left shoulder: Secondary | ICD-10-CM | POA: Diagnosis not present

## 2021-04-17 DIAGNOSIS — M25512 Pain in left shoulder: Secondary | ICD-10-CM | POA: Diagnosis not present

## 2021-04-17 DIAGNOSIS — M62522 Muscle wasting and atrophy, not elsewhere classified, left upper arm: Secondary | ICD-10-CM | POA: Diagnosis not present

## 2021-04-21 DIAGNOSIS — M62522 Muscle wasting and atrophy, not elsewhere classified, left upper arm: Secondary | ICD-10-CM | POA: Diagnosis not present

## 2021-04-21 DIAGNOSIS — M25512 Pain in left shoulder: Secondary | ICD-10-CM | POA: Diagnosis not present

## 2021-04-24 DIAGNOSIS — M25512 Pain in left shoulder: Secondary | ICD-10-CM | POA: Diagnosis not present

## 2021-04-24 DIAGNOSIS — M62522 Muscle wasting and atrophy, not elsewhere classified, left upper arm: Secondary | ICD-10-CM | POA: Diagnosis not present

## 2021-04-27 DIAGNOSIS — M62522 Muscle wasting and atrophy, not elsewhere classified, left upper arm: Secondary | ICD-10-CM | POA: Diagnosis not present

## 2021-04-27 DIAGNOSIS — M25512 Pain in left shoulder: Secondary | ICD-10-CM | POA: Diagnosis not present

## 2021-04-30 DIAGNOSIS — M25512 Pain in left shoulder: Secondary | ICD-10-CM | POA: Diagnosis not present

## 2021-04-30 DIAGNOSIS — M62522 Muscle wasting and atrophy, not elsewhere classified, left upper arm: Secondary | ICD-10-CM | POA: Diagnosis not present

## 2021-05-06 DIAGNOSIS — Z6837 Body mass index (BMI) 37.0-37.9, adult: Secondary | ICD-10-CM | POA: Diagnosis not present

## 2021-05-06 DIAGNOSIS — R109 Unspecified abdominal pain: Secondary | ICD-10-CM | POA: Diagnosis not present

## 2021-05-07 DIAGNOSIS — I700 Atherosclerosis of aorta: Secondary | ICD-10-CM | POA: Diagnosis not present

## 2021-05-07 DIAGNOSIS — R109 Unspecified abdominal pain: Secondary | ICD-10-CM | POA: Diagnosis not present

## 2021-05-07 DIAGNOSIS — N200 Calculus of kidney: Secondary | ICD-10-CM | POA: Diagnosis not present

## 2021-05-07 DIAGNOSIS — N281 Cyst of kidney, acquired: Secondary | ICD-10-CM | POA: Diagnosis not present

## 2021-05-07 DIAGNOSIS — K5730 Diverticulosis of large intestine without perforation or abscess without bleeding: Secondary | ICD-10-CM | POA: Diagnosis not present

## 2021-05-13 DIAGNOSIS — N401 Enlarged prostate with lower urinary tract symptoms: Secondary | ICD-10-CM | POA: Diagnosis not present

## 2021-05-13 DIAGNOSIS — Z79899 Other long term (current) drug therapy: Secondary | ICD-10-CM | POA: Diagnosis not present

## 2021-05-13 DIAGNOSIS — N200 Calculus of kidney: Secondary | ICD-10-CM | POA: Diagnosis not present

## 2021-05-14 DIAGNOSIS — M25512 Pain in left shoulder: Secondary | ICD-10-CM | POA: Diagnosis not present

## 2021-05-14 DIAGNOSIS — M62522 Muscle wasting and atrophy, not elsewhere classified, left upper arm: Secondary | ICD-10-CM | POA: Diagnosis not present

## 2021-05-18 DIAGNOSIS — M0609 Rheumatoid arthritis without rheumatoid factor, multiple sites: Secondary | ICD-10-CM | POA: Diagnosis not present

## 2021-05-21 DIAGNOSIS — M62522 Muscle wasting and atrophy, not elsewhere classified, left upper arm: Secondary | ICD-10-CM | POA: Diagnosis not present

## 2021-05-21 DIAGNOSIS — M25512 Pain in left shoulder: Secondary | ICD-10-CM | POA: Diagnosis not present

## 2021-05-25 DIAGNOSIS — M25512 Pain in left shoulder: Secondary | ICD-10-CM | POA: Diagnosis not present

## 2021-05-25 DIAGNOSIS — M62522 Muscle wasting and atrophy, not elsewhere classified, left upper arm: Secondary | ICD-10-CM | POA: Diagnosis not present

## 2021-05-29 DIAGNOSIS — M25512 Pain in left shoulder: Secondary | ICD-10-CM | POA: Diagnosis not present

## 2021-05-29 DIAGNOSIS — M62522 Muscle wasting and atrophy, not elsewhere classified, left upper arm: Secondary | ICD-10-CM | POA: Diagnosis not present

## 2021-06-01 DIAGNOSIS — M25512 Pain in left shoulder: Secondary | ICD-10-CM | POA: Diagnosis not present

## 2021-06-01 DIAGNOSIS — M62522 Muscle wasting and atrophy, not elsewhere classified, left upper arm: Secondary | ICD-10-CM | POA: Diagnosis not present

## 2021-06-04 DIAGNOSIS — M25512 Pain in left shoulder: Secondary | ICD-10-CM | POA: Diagnosis not present

## 2021-06-04 DIAGNOSIS — M62522 Muscle wasting and atrophy, not elsewhere classified, left upper arm: Secondary | ICD-10-CM | POA: Diagnosis not present

## 2021-06-10 DIAGNOSIS — M25512 Pain in left shoulder: Secondary | ICD-10-CM | POA: Diagnosis not present

## 2021-06-10 DIAGNOSIS — M62522 Muscle wasting and atrophy, not elsewhere classified, left upper arm: Secondary | ICD-10-CM | POA: Diagnosis not present

## 2021-06-15 DIAGNOSIS — M25512 Pain in left shoulder: Secondary | ICD-10-CM | POA: Diagnosis not present

## 2021-06-16 DIAGNOSIS — M25511 Pain in right shoulder: Secondary | ICD-10-CM | POA: Diagnosis not present

## 2021-06-16 DIAGNOSIS — M25611 Stiffness of right shoulder, not elsewhere classified: Secondary | ICD-10-CM | POA: Diagnosis not present

## 2021-06-16 DIAGNOSIS — N200 Calculus of kidney: Secondary | ICD-10-CM | POA: Diagnosis not present

## 2021-06-16 DIAGNOSIS — M19011 Primary osteoarthritis, right shoulder: Secondary | ICD-10-CM | POA: Diagnosis not present

## 2021-06-16 DIAGNOSIS — R109 Unspecified abdominal pain: Secondary | ICD-10-CM | POA: Diagnosis not present

## 2021-06-17 ENCOUNTER — Ambulatory Visit: Payer: Medicare Other | Admitting: Cardiology

## 2021-06-17 DIAGNOSIS — R1032 Left lower quadrant pain: Secondary | ICD-10-CM | POA: Diagnosis not present

## 2021-06-17 DIAGNOSIS — N401 Enlarged prostate with lower urinary tract symptoms: Secondary | ICD-10-CM | POA: Diagnosis not present

## 2021-06-17 DIAGNOSIS — N200 Calculus of kidney: Secondary | ICD-10-CM | POA: Diagnosis not present

## 2021-06-17 DIAGNOSIS — R1031 Right lower quadrant pain: Secondary | ICD-10-CM | POA: Diagnosis not present

## 2021-06-18 ENCOUNTER — Ambulatory Visit: Payer: Medicare Other | Admitting: Cardiology

## 2021-06-18 DIAGNOSIS — M25512 Pain in left shoulder: Secondary | ICD-10-CM | POA: Diagnosis not present

## 2021-06-23 DIAGNOSIS — M75102 Unspecified rotator cuff tear or rupture of left shoulder, not specified as traumatic: Secondary | ICD-10-CM | POA: Diagnosis not present

## 2021-06-24 DIAGNOSIS — N200 Calculus of kidney: Secondary | ICD-10-CM | POA: Diagnosis not present

## 2021-06-24 DIAGNOSIS — I700 Atherosclerosis of aorta: Secondary | ICD-10-CM | POA: Diagnosis not present

## 2021-06-24 DIAGNOSIS — M25512 Pain in left shoulder: Secondary | ICD-10-CM | POA: Diagnosis not present

## 2021-06-24 DIAGNOSIS — M62522 Muscle wasting and atrophy, not elsewhere classified, left upper arm: Secondary | ICD-10-CM | POA: Diagnosis not present

## 2021-06-24 DIAGNOSIS — K5730 Diverticulosis of large intestine without perforation or abscess without bleeding: Secondary | ICD-10-CM | POA: Diagnosis not present

## 2021-06-29 DIAGNOSIS — M25512 Pain in left shoulder: Secondary | ICD-10-CM | POA: Diagnosis not present

## 2021-06-29 DIAGNOSIS — Z79899 Other long term (current) drug therapy: Secondary | ICD-10-CM | POA: Diagnosis not present

## 2021-06-29 DIAGNOSIS — M0609 Rheumatoid arthritis without rheumatoid factor, multiple sites: Secondary | ICD-10-CM | POA: Diagnosis not present

## 2021-06-29 DIAGNOSIS — M62522 Muscle wasting and atrophy, not elsewhere classified, left upper arm: Secondary | ICD-10-CM | POA: Diagnosis not present

## 2021-07-03 DIAGNOSIS — M25512 Pain in left shoulder: Secondary | ICD-10-CM | POA: Diagnosis not present

## 2021-07-03 DIAGNOSIS — M62522 Muscle wasting and atrophy, not elsewhere classified, left upper arm: Secondary | ICD-10-CM | POA: Diagnosis not present

## 2021-07-06 DIAGNOSIS — M62522 Muscle wasting and atrophy, not elsewhere classified, left upper arm: Secondary | ICD-10-CM | POA: Diagnosis not present

## 2021-07-06 DIAGNOSIS — M25512 Pain in left shoulder: Secondary | ICD-10-CM | POA: Diagnosis not present

## 2021-07-09 DIAGNOSIS — M25512 Pain in left shoulder: Secondary | ICD-10-CM | POA: Diagnosis not present

## 2021-07-09 DIAGNOSIS — M19011 Primary osteoarthritis, right shoulder: Secondary | ICD-10-CM | POA: Diagnosis not present

## 2021-07-09 DIAGNOSIS — M62522 Muscle wasting and atrophy, not elsewhere classified, left upper arm: Secondary | ICD-10-CM | POA: Diagnosis not present

## 2021-07-09 DIAGNOSIS — M25511 Pain in right shoulder: Secondary | ICD-10-CM | POA: Diagnosis not present

## 2021-07-21 DIAGNOSIS — S46211A Strain of muscle, fascia and tendon of other parts of biceps, right arm, initial encounter: Secondary | ICD-10-CM | POA: Diagnosis not present

## 2021-07-21 DIAGNOSIS — M7541 Impingement syndrome of right shoulder: Secondary | ICD-10-CM | POA: Diagnosis not present

## 2021-07-21 DIAGNOSIS — M7551 Bursitis of right shoulder: Secondary | ICD-10-CM | POA: Diagnosis not present

## 2021-07-27 DIAGNOSIS — M62522 Muscle wasting and atrophy, not elsewhere classified, left upper arm: Secondary | ICD-10-CM | POA: Diagnosis not present

## 2021-07-27 DIAGNOSIS — M25512 Pain in left shoulder: Secondary | ICD-10-CM | POA: Diagnosis not present

## 2021-07-30 DIAGNOSIS — M25512 Pain in left shoulder: Secondary | ICD-10-CM | POA: Diagnosis not present

## 2021-07-30 DIAGNOSIS — M62522 Muscle wasting and atrophy, not elsewhere classified, left upper arm: Secondary | ICD-10-CM | POA: Diagnosis not present

## 2021-08-04 DIAGNOSIS — M25512 Pain in left shoulder: Secondary | ICD-10-CM | POA: Diagnosis not present

## 2021-08-04 DIAGNOSIS — M62522 Muscle wasting and atrophy, not elsewhere classified, left upper arm: Secondary | ICD-10-CM | POA: Diagnosis not present

## 2021-08-06 DIAGNOSIS — M25512 Pain in left shoulder: Secondary | ICD-10-CM | POA: Diagnosis not present

## 2021-08-06 DIAGNOSIS — M62522 Muscle wasting and atrophy, not elsewhere classified, left upper arm: Secondary | ICD-10-CM | POA: Diagnosis not present

## 2021-08-10 DIAGNOSIS — M19012 Primary osteoarthritis, left shoulder: Secondary | ICD-10-CM | POA: Diagnosis not present

## 2021-08-10 DIAGNOSIS — M75102 Unspecified rotator cuff tear or rupture of left shoulder, not specified as traumatic: Secondary | ICD-10-CM | POA: Diagnosis not present

## 2021-08-10 DIAGNOSIS — M25512 Pain in left shoulder: Secondary | ICD-10-CM | POA: Diagnosis not present

## 2021-08-11 DIAGNOSIS — M0609 Rheumatoid arthritis without rheumatoid factor, multiple sites: Secondary | ICD-10-CM | POA: Diagnosis not present

## 2021-08-13 ENCOUNTER — Encounter: Payer: Self-pay | Admitting: Neurology

## 2021-08-13 ENCOUNTER — Ambulatory Visit (INDEPENDENT_AMBULATORY_CARE_PROVIDER_SITE_OTHER): Payer: Medicare Other | Admitting: Neurology

## 2021-08-13 VITALS — BP 157/85 | HR 52 | Ht 74.0 in | Wt 246.0 lb

## 2021-08-13 DIAGNOSIS — F09 Unspecified mental disorder due to known physiological condition: Secondary | ICD-10-CM | POA: Diagnosis not present

## 2021-08-13 DIAGNOSIS — R0683 Snoring: Secondary | ICD-10-CM

## 2021-08-13 HISTORY — DX: Snoring: R06.83

## 2021-08-13 MED ORDER — DONEPEZIL HCL 10 MG PO TABS: 10.0000 mg | ORAL_TABLET | Freq: Every day | ORAL | 4 refills | Status: DC

## 2021-08-13 MED ORDER — MEMANTINE HCL 10 MG PO TABS: ORAL_TABLET | ORAL | 6 refills | Status: DC

## 2021-08-13 NOTE — Patient Instructions (Signed)
Meds ordered this encounter  Medications   memantine (NAMENDA) 10 MG tablet    Sig: Take 1/2 tablet twice daily x 2 weeks, then take 1 tablet twice daily    Dispense:  60 tablet    Refill:  6   donepezil (ARICEPT) 10 MG tablet    Sig: Take 1 tablet (10 mg total) by mouth daily.    Dispense:  90 tablet    Refill:  4

## 2021-08-13 NOTE — Progress Notes (Signed)
PATIENT: Brett Rosales DOB: [birth year]  REASON FOR VISIT: Follow up HISTORY FROM: Patient, wife PRIMARY NEUROLOGIST: Dr. Krista Blue, Dr. Brett Fairy for sleep consult  HISTORY  07/29/2020 Dr. Krista Blue: Brett Rosales, is a 76 year old male, seen in request by his primary care physician Dr. Helene Kelp, Abagail Kitchens for evaluation of memory loss, excessive fatigue, he is accompanied by his wife at today's visit on Jul 30, 2020   I reviewed and summarized the referring note. PMHX. 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 at home, has multiple degenerative joint disease, chronic low back pain, has been treated as rheumatoid arthritis for many years, use different medications, including Remicade, methotrexate, also had a history of atrial fibrillation, on chronic Xarelto   He was noted to have gradual onset of memory loss since his multiple joint replacement surgery in 2020, he had left hip replacement in 2020, left knee replacement in summer 2021, and right knee replacement in November 2021   Following all those general anesthesia, he was noted to have some changes, he often forgets to clean himself up after project, he used to be very good with direction, now often misses a turn when driving,   There was no family history of dementia, he was put on Aricept 5 mg few months ago, while he was taking every night, he complains of excessive dreams, improved after move the dosage to every morning, he is independent in his daily activity, MoCA examination is 23/30 today   Because of his multiple joints pain, he did not exercise regularly, did have weight gain, used to have loud snoring, sometimes up in the middle of the night catching his breath  Update February 11, 2021 SS: Had rotator cuff surgery 3 weeks ago, on the left arm. Saw Dr.  Brett Fairy to have sleep study, didn't schedule yet.  B12, RPR, TSH were normal May 2022.  MRI of the brain in July 2022 showed no acute abnormality, mild age-related atrophy SVD, moderate paranasal sinus disease  Taking Aricept 10 mg AM, noticed improvement reported by his wife, no more side effects. He is remembering things better, not asking as much repetitive questioning. Is driving, not getting lost. In stressful situations, can't process what he needs to do as quickly as before, will start stuttering, gets frustrated. They are building a house which is stressful.   MOCA today was 28/30.  Update August 13, 2021 SS: [facility] 24/30, here with his wife, worsening memory loss, forgets household chores/tasks, stress from building a home, he did the hot Occupational psychologist, electrical wiring. Drives a car okay for now. Remains on Aricept 10 mg daily, his wife manages pill box. Never had sleep test, claims snoring is less. Taken 2 sleep tests in the past, was told he had sleep apnea, but data wasn't good, never came for in lab. Gets flustered with questioning. Takes Ritalin. Feels more irritable. Has lost 40 lbs in the last year, with increasing activity.  REVIEW OF SYSTEMS: Out of a complete 14 system review of symptoms, the patient complains only of the following symptoms, and all other reviewed systems are negative.  See HPI  ALLERGIES: Allergies  Allergen Reactions   Nsaids Other (See Comments)    "Eats hole in stomach".Marland KitchenMarland KitchenMarland KitchenIbuprofen, Meloxicam...   Statins Other (See Comments)    Myalgias  HOME MEDICATIONS: Outpatient Medications Prior to Visit  Medication Sig Dispense Refill   B Complex Vitamins (VITAMIN B COMPLEX) TABS Take 1 tablet by mouth daily.     Cholecalciferol (VITAMIN D3 PO) Take 2,000 Units by mouth daily.     Docusate Sodium (DSS) 100 MG CAPS Take 100 mg by mouth in the morning and at bedtime.     donepezil (ARICEPT) 10 MG tablet Take 1 tablet (10 mg total) by mouth  daily. 90 tablet 4   DULoxetine (CYMBALTA) 30 MG capsule Take 30 mg by mouth daily.     ezetimibe (ZETIA) 10 MG tablet Take 10 mg by mouth daily.     folic acid (FOLVITE) 1 MG tablet Take 1 mg by mouth daily.     inFLIXimab (REMICADE IV) Inject 1 vial into the vein See admin instructions. Every 6 weeks/ Unknown strenght     losartan (COZAAR) 100 MG tablet Take 100 mg by mouth daily.     methotrexate 50 MG/2ML injection Inject 25 mg into the skin once a week.      methylphenidate (RITALIN) 20 MG tablet Take 40 mg by mouth daily.      metoprolol tartrate (LOPRESSOR) 25 MG tablet TAKE 1 TABLET BY MOUTH TWICE DAILY. 180 tablet 2   NON FORMULARY Place 1 drop into both eyes as needed. For eye sight     rivaroxaban (XARELTO) 20 MG TABS tablet TAKE 1 TABLET BY MOUTH DAILY WITH SUPPER 180 tablet 1   UNABLE TO FIND Take 1 tablet by mouth daily. Med Name: Lecithin/ Unknown strenght     No facility-administered medications prior to visit.    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 it was inconclusive. needs follow up   Supraventricular tachycardia ([place]) 12/09/2017   Swelling of left lower extremity 05/17/2018    Typical atrial flutter ([place]) 12/16/2017    PAST SURGICAL HISTORY: Past Surgical History:  Procedure Laterality Date   TONSILLECTOMY AND ADENOIDECTOMY     TOTAL HIP ARTHROPLASTY Left 09/21/2018   Procedure: TOTAL HIP ARTHROPLASTY ANTERIOR APPROACH;  Surgeon: Rod Can, MD;  Location: [facility];  Service: Orthopedics;  Laterality: Left;   TOTAL KNEE ARTHROPLASTY Left 09/24/2019   Procedure: TOTAL KNEE ARTHROPLASTY;  Surgeon: Vickey Huger, MD;  Location: [facility];  Service: Orthopedics;  Laterality: Left;   TOTAL KNEE ARTHROPLASTY Right 02/04/2020   Procedure: TOTAL KNEE ARTHROPLASTY;  Surgeon: Vickey Huger, MD;  Location: [facility];  Service: Orthopedics;  Laterality: Right;   VASECTOMY  1990    FAMILY HISTORY: Family History  Problem Relation Age of Onset   Diabetes Mother  Cancer Mother    Hypertension Father    Diabetes Paternal Grandmother     SOCIAL HISTORY: Social History   Socioeconomic History   Marital status: Married    Spouse name: [name]   Number of children: 3   Years of education: Not on file   Highest education level: High school graduate  Occupational History   Not on file  Tobacco Use   Smoking status: Former    Packs/day: 1.00    Years: 15.00    Total pack years: 15.00    Types: Cigarettes    Quit date: 01/29/1986    Years since quitting: 35.5   Smokeless tobacco: Never  Vaping Use   Vaping Use: Never used  Substance and Sexual Activity   Alcohol use: No    Alcohol/week: 0.0 standard drinks of alcohol   Drug use: No   Sexual activity: Not on file  Other Topics Concern   Not on file  Social History Narrative   Lives with wife   Right handed   Caffeine: occa drinks coffee, no energy drink. Ginger tea   Social Determinants of Health   Financial Resource Strain: Not on file  Food Insecurity: Not on file  Transportation Needs: Not on file  Physical Activity: Not on file  Stress: Not on file  Social Connections: Not on file  Intimate Partner  Violence: Not on file       08/13/2021    1:38 PM 02/11/2021    2:47 PM 07/29/2020    3:22 PM  Montreal Cognitive Assessment   Visuospatial/ Executive (0/5) '5 5 4  '$ Naming (0/3) '3 3 2  '$ Attention: Read list of digits (0/2) '2 2 2  '$ Attention: Read list of letters (0/1) 1 1 0  Attention: Serial 7 subtraction starting at 100 (0/3) '3 3 3  '$ Language: Repeat phrase (0/2) '1 2 2  '$ Language : Fluency (0/1) '1 1 1  '$ Abstraction (0/2) '2 2 2  '$ Delayed Recall (0/5) '1 4 1  '$ Orientation (0/6) '5 5 6  '$ Total '24 28 23   '$ PHYSICAL EXAM  Vitals:   08/13/21 1333  BP: (!) 157/85  Pulse: (!) 52  Weight: 246 lb (111.6 kg)  Height: '6\' 2"'$  (1.88 m)    Body mass index is 31.58 kg/m.  Generalized: Well developed, in no acute distress, very pleasant, makes jokes Neurological examination  Mentation: Alert oriented to time, place, history taking. Follows all commands speech and language fluent Cranial nerve II-XII: Pupils were equal round reactive to light. Extraocular movements were full, visual field were full on confrontational test. Facial sensation and strength were normal.  Motor: Good strength throughout Sensory: Sensory testing is intact to soft touch on all 4 extremities. No evidence of extinction is noted.  Coordination: Cerebellar testing reveals good finger-nose-finger bilaterally  Gait and station: Gait is normal , indpendent Reflexes: Deep tendon reflexes are symmetric but decreased throughout  DIAGNOSTIC DATA (LABS, IMAGING, TESTING) - I reviewed patient records, labs, notes, testing and imaging myself where available.  Lab Results  Component Value Date   WBC 5.3 01/30/2020   HGB 14.1 01/30/2020   HCT 44.8 01/30/2020   MCV 95.5 01/30/2020   PLT 247 01/30/2020      Component Value Date/Time   NA 140 01/30/2020 1142   K 5.2 (H) 01/30/2020 1142   CL 110 01/30/2020 1142   CO2 23 01/30/2020 1142   GLUCOSE 99 01/30/2020 1142   BUN 27 (H) 01/30/2020 1142   CREATININE 1.09 01/30/2020 1142  CALCIUM 9.0 01/30/2020 1142   PROT 6.9 01/30/2020 1142   ALBUMIN 3.9 01/30/2020 1142   AST 15 01/30/2020 1142   ALT 17 01/30/2020 1142   ALKPHOS 72 01/30/2020 1142   BILITOT 0.5 01/30/2020 1142   GFRNONAA >60 01/30/2020 1142   GFRAA >60 09/20/2019 1204   Lab Results  Component Value Date   CHOL 233 (H) 12/13/2019   HDL 88 12/13/2019   LDLCALC 123 (H) 12/13/2019   TRIG 130 12/13/2019   CHOLHDL 2.6 12/13/2019   No results found for: "HGBA1C" Lab Results  Component Value Date   VITAMINB12 360 07/29/2020   Lab Results  Component Value Date   TSH 2.320 07/29/2020    ASSESSMENT AND PLAN 76 y.o. year old male   Cognitive impairment Excessive snoring, fatigue  -decline in MOCA 24/30 (was 28/30) -Add on Namenda working up to 10 mg twice daily -Continue Aricept 10 mg daily -Not interested in sleep study, claims won't be able to achieve sleep, has lost 40 lbs in last yeat -MRI of the brain in July 2022 showed no acute abnormality, mild age-related atrophy SVD, moderate paranasal sinus disease -B12, TSH, RPR were normal -Follow-up in 6 months or sooner if needed  Butler Denmark, AGNP-C, DNP 08/13/2021, 1:56 PM [facility] [address], [place], [place] [address] [phone]

## 2021-08-14 DIAGNOSIS — M25512 Pain in left shoulder: Secondary | ICD-10-CM | POA: Diagnosis not present

## 2021-08-14 DIAGNOSIS — M62522 Muscle wasting and atrophy, not elsewhere classified, left upper arm: Secondary | ICD-10-CM | POA: Diagnosis not present

## 2021-08-18 DIAGNOSIS — M25512 Pain in left shoulder: Secondary | ICD-10-CM | POA: Diagnosis not present

## 2021-08-18 DIAGNOSIS — M62522 Muscle wasting and atrophy, not elsewhere classified, left upper arm: Secondary | ICD-10-CM | POA: Diagnosis not present

## 2021-08-25 DIAGNOSIS — I700 Atherosclerosis of aorta: Secondary | ICD-10-CM

## 2021-08-25 DIAGNOSIS — M51369 Other intervertebral disc degeneration, lumbar region without mention of lumbar back pain or lower extremity pain: Secondary | ICD-10-CM | POA: Insufficient documentation

## 2021-08-25 DIAGNOSIS — M5136 Other intervertebral disc degeneration, lumbar region: Secondary | ICD-10-CM | POA: Insufficient documentation

## 2021-08-25 DIAGNOSIS — M5451 Vertebrogenic low back pain: Secondary | ICD-10-CM | POA: Diagnosis not present

## 2021-08-25 HISTORY — DX: Atherosclerosis of aorta: I70.0

## 2021-09-09 DIAGNOSIS — M545 Low back pain, unspecified: Secondary | ICD-10-CM | POA: Diagnosis not present

## 2021-09-09 DIAGNOSIS — M5451 Vertebrogenic low back pain: Secondary | ICD-10-CM | POA: Diagnosis not present

## 2021-09-09 DIAGNOSIS — M5136 Other intervertebral disc degeneration, lumbar region: Secondary | ICD-10-CM | POA: Diagnosis not present

## 2021-09-11 DIAGNOSIS — M5136 Other intervertebral disc degeneration, lumbar region: Secondary | ICD-10-CM | POA: Diagnosis not present

## 2021-09-11 DIAGNOSIS — M5451 Vertebrogenic low back pain: Secondary | ICD-10-CM | POA: Diagnosis not present

## 2021-09-11 DIAGNOSIS — M545 Low back pain, unspecified: Secondary | ICD-10-CM | POA: Diagnosis not present

## 2021-09-14 DIAGNOSIS — M5451 Vertebrogenic low back pain: Secondary | ICD-10-CM | POA: Diagnosis not present

## 2021-09-14 DIAGNOSIS — M545 Low back pain, unspecified: Secondary | ICD-10-CM | POA: Diagnosis not present

## 2021-09-14 DIAGNOSIS — M5136 Other intervertebral disc degeneration, lumbar region: Secondary | ICD-10-CM | POA: Diagnosis not present

## 2021-09-17 DIAGNOSIS — M5451 Vertebrogenic low back pain: Secondary | ICD-10-CM | POA: Diagnosis not present

## 2021-09-17 DIAGNOSIS — Z79899 Other long term (current) drug therapy: Secondary | ICD-10-CM | POA: Diagnosis not present

## 2021-09-17 DIAGNOSIS — M5136 Other intervertebral disc degeneration, lumbar region: Secondary | ICD-10-CM | POA: Diagnosis not present

## 2021-09-17 DIAGNOSIS — M0609 Rheumatoid arthritis without rheumatoid factor, multiple sites: Secondary | ICD-10-CM | POA: Diagnosis not present

## 2021-09-17 DIAGNOSIS — M1991 Primary osteoarthritis, unspecified site: Secondary | ICD-10-CM | POA: Diagnosis not present

## 2021-09-17 DIAGNOSIS — E669 Obesity, unspecified: Secondary | ICD-10-CM | POA: Diagnosis not present

## 2021-09-17 DIAGNOSIS — Z6831 Body mass index (BMI) 31.0-31.9, adult: Secondary | ICD-10-CM | POA: Diagnosis not present

## 2021-09-17 DIAGNOSIS — M545 Low back pain, unspecified: Secondary | ICD-10-CM | POA: Diagnosis not present

## 2021-09-21 DIAGNOSIS — M545 Low back pain, unspecified: Secondary | ICD-10-CM | POA: Diagnosis not present

## 2021-09-21 DIAGNOSIS — M5136 Other intervertebral disc degeneration, lumbar region: Secondary | ICD-10-CM | POA: Diagnosis not present

## 2021-09-21 DIAGNOSIS — M5451 Vertebrogenic low back pain: Secondary | ICD-10-CM | POA: Diagnosis not present

## 2021-09-22 DIAGNOSIS — M0609 Rheumatoid arthritis without rheumatoid factor, multiple sites: Secondary | ICD-10-CM | POA: Diagnosis not present

## 2021-09-22 DIAGNOSIS — Z79899 Other long term (current) drug therapy: Secondary | ICD-10-CM | POA: Diagnosis not present

## 2021-09-24 DIAGNOSIS — M5451 Vertebrogenic low back pain: Secondary | ICD-10-CM | POA: Diagnosis not present

## 2021-09-24 DIAGNOSIS — M545 Low back pain, unspecified: Secondary | ICD-10-CM | POA: Diagnosis not present

## 2021-09-24 DIAGNOSIS — M5136 Other intervertebral disc degeneration, lumbar region: Secondary | ICD-10-CM | POA: Diagnosis not present

## 2021-09-26 DIAGNOSIS — M25819 Other specified joint disorders, unspecified shoulder: Secondary | ICD-10-CM | POA: Diagnosis not present

## 2021-09-26 DIAGNOSIS — L0291 Cutaneous abscess, unspecified: Secondary | ICD-10-CM | POA: Diagnosis not present

## 2021-09-28 DIAGNOSIS — M5451 Vertebrogenic low back pain: Secondary | ICD-10-CM | POA: Diagnosis not present

## 2021-09-28 DIAGNOSIS — M545 Low back pain, unspecified: Secondary | ICD-10-CM | POA: Diagnosis not present

## 2021-09-28 DIAGNOSIS — M5136 Other intervertebral disc degeneration, lumbar region: Secondary | ICD-10-CM | POA: Diagnosis not present

## 2021-10-01 DIAGNOSIS — M5136 Other intervertebral disc degeneration, lumbar region: Secondary | ICD-10-CM | POA: Diagnosis not present

## 2021-10-01 DIAGNOSIS — M5451 Vertebrogenic low back pain: Secondary | ICD-10-CM | POA: Diagnosis not present

## 2021-10-01 DIAGNOSIS — M545 Low back pain, unspecified: Secondary | ICD-10-CM | POA: Diagnosis not present

## 2021-10-20 DIAGNOSIS — M47816 Spondylosis without myelopathy or radiculopathy, lumbar region: Secondary | ICD-10-CM

## 2021-10-20 HISTORY — DX: Spondylosis without myelopathy or radiculopathy, lumbar region: M47.816

## 2021-11-02 DIAGNOSIS — M0609 Rheumatoid arthritis without rheumatoid factor, multiple sites: Secondary | ICD-10-CM | POA: Diagnosis not present

## 2021-11-10 ENCOUNTER — Other Ambulatory Visit: Payer: Self-pay | Admitting: Cardiology

## 2021-11-24 DIAGNOSIS — M7541 Impingement syndrome of right shoulder: Secondary | ICD-10-CM | POA: Diagnosis not present

## 2021-11-24 DIAGNOSIS — S46211A Strain of muscle, fascia and tendon of other parts of biceps, right arm, initial encounter: Secondary | ICD-10-CM | POA: Diagnosis not present

## 2021-11-25 DIAGNOSIS — S46211A Strain of muscle, fascia and tendon of other parts of biceps, right arm, initial encounter: Secondary | ICD-10-CM | POA: Diagnosis not present

## 2021-11-25 DIAGNOSIS — M7541 Impingement syndrome of right shoulder: Secondary | ICD-10-CM | POA: Diagnosis not present

## 2021-12-01 DIAGNOSIS — M47896 Other spondylosis, lumbar region: Secondary | ICD-10-CM | POA: Diagnosis not present

## 2021-12-01 DIAGNOSIS — M47816 Spondylosis without myelopathy or radiculopathy, lumbar region: Secondary | ICD-10-CM | POA: Diagnosis not present

## 2021-12-14 DIAGNOSIS — M0609 Rheumatoid arthritis without rheumatoid factor, multiple sites: Secondary | ICD-10-CM | POA: Diagnosis not present

## 2021-12-14 DIAGNOSIS — Z79899 Other long term (current) drug therapy: Secondary | ICD-10-CM | POA: Diagnosis not present

## 2021-12-16 ENCOUNTER — Other Ambulatory Visit: Payer: Self-pay

## 2021-12-16 MED ORDER — METOPROLOL TARTRATE 25 MG PO TABS: 25.0000 mg | ORAL_TABLET | Freq: Two times a day (BID) | ORAL | 0 refills | Status: DC

## 2021-12-17 ENCOUNTER — Other Ambulatory Visit: Payer: Self-pay | Admitting: *Deleted

## 2021-12-17 ENCOUNTER — Other Ambulatory Visit: Payer: Self-pay | Admitting: Cardiology

## 2021-12-17 DIAGNOSIS — I4892 Unspecified atrial flutter: Secondary | ICD-10-CM

## 2021-12-17 DIAGNOSIS — F09 Unspecified mental disorder due to known physiological condition: Secondary | ICD-10-CM

## 2021-12-17 MED ORDER — MEMANTINE HCL 10 MG PO TABS: 10.0000 mg | ORAL_TABLET | Freq: Two times a day (BID) | ORAL | 5 refills | Status: DC

## 2021-12-17 MED ORDER — DONEPEZIL HCL 10 MG PO TABS: 10.0000 mg | ORAL_TABLET | Freq: Every day | ORAL | 3 refills | Status: DC

## 2021-12-17 NOTE — Telephone Encounter (Signed)
Pt scheduled appt with Dr Agustin Cree on 12/29/21. Appropriate dose and refill sent to requested pharmacy.

## 2021-12-17 NOTE — Telephone Encounter (Signed)
Prescription refill request for Xarelto received.  Indication: Aflutter Last office visit: 09/11/20 Agustin Cree)  Weight: 111.6kg Age: 76 Scr: 1.16 (09/22/21 via [place]) CrCl: 88.25m/min  Overdue to see cardiologist. SDurene Calmessage to [facility] to reach out to pt.

## 2021-12-22 DIAGNOSIS — M47896 Other spondylosis, lumbar region: Secondary | ICD-10-CM | POA: Diagnosis not present

## 2021-12-22 DIAGNOSIS — M47816 Spondylosis without myelopathy or radiculopathy, lumbar region: Secondary | ICD-10-CM | POA: Diagnosis not present

## 2021-12-23 DIAGNOSIS — Z23 Encounter for immunization: Secondary | ICD-10-CM | POA: Diagnosis not present

## 2021-12-23 DIAGNOSIS — Z7901 Long term (current) use of anticoagulants: Secondary | ICD-10-CM | POA: Diagnosis not present

## 2021-12-23 DIAGNOSIS — L03115 Cellulitis of right lower limb: Secondary | ICD-10-CM | POA: Diagnosis not present

## 2021-12-23 DIAGNOSIS — Z6834 Body mass index (BMI) 34.0-34.9, adult: Secondary | ICD-10-CM | POA: Diagnosis not present

## 2021-12-29 ENCOUNTER — Ambulatory Visit: Payer: Medicare Other | Admitting: Cardiology

## 2021-12-31 ENCOUNTER — Other Ambulatory Visit: Payer: Self-pay

## 2021-12-31 MED ORDER — METOPROLOL TARTRATE 25 MG PO TABS: 25.0000 mg | ORAL_TABLET | Freq: Two times a day (BID) | ORAL | 0 refills | Status: DC

## 2022-01-05 ENCOUNTER — Other Ambulatory Visit: Payer: Self-pay

## 2022-01-05 MED ORDER — METOPROLOL TARTRATE 25 MG PO TABS: 25.0000 mg | ORAL_TABLET | Freq: Two times a day (BID) | ORAL | 0 refills | Status: DC

## 2022-01-07 DIAGNOSIS — H43813 Vitreous degeneration, bilateral: Secondary | ICD-10-CM | POA: Diagnosis not present

## 2022-01-12 ENCOUNTER — Encounter: Payer: Self-pay | Admitting: Cardiology

## 2022-01-12 ENCOUNTER — Ambulatory Visit: Payer: Medicare Other | Attending: Cardiology | Admitting: Cardiology

## 2022-01-12 VITALS — BP 142/90 | HR 56 | Ht 74.0 in | Wt 256.2 lb

## 2022-01-12 DIAGNOSIS — I471 Supraventricular tachycardia, unspecified: Secondary | ICD-10-CM | POA: Diagnosis not present

## 2022-01-12 DIAGNOSIS — E785 Hyperlipidemia, unspecified: Secondary | ICD-10-CM | POA: Diagnosis not present

## 2022-01-12 DIAGNOSIS — I10 Essential (primary) hypertension: Secondary | ICD-10-CM | POA: Diagnosis not present

## 2022-01-12 DIAGNOSIS — Z79899 Other long term (current) drug therapy: Secondary | ICD-10-CM | POA: Diagnosis not present

## 2022-01-12 DIAGNOSIS — I7121 Aneurysm of the ascending aorta, without rupture: Secondary | ICD-10-CM

## 2022-01-12 DIAGNOSIS — R0609 Other forms of dyspnea: Secondary | ICD-10-CM

## 2022-01-12 DIAGNOSIS — Z6832 Body mass index (BMI) 32.0-32.9, adult: Secondary | ICD-10-CM | POA: Diagnosis not present

## 2022-01-12 DIAGNOSIS — E669 Obesity, unspecified: Secondary | ICD-10-CM | POA: Diagnosis not present

## 2022-01-12 DIAGNOSIS — M1991 Primary osteoarthritis, unspecified site: Secondary | ICD-10-CM | POA: Diagnosis not present

## 2022-01-12 DIAGNOSIS — M0609 Rheumatoid arthritis without rheumatoid factor, multiple sites: Secondary | ICD-10-CM | POA: Diagnosis not present

## 2022-01-12 DIAGNOSIS — I483 Typical atrial flutter: Secondary | ICD-10-CM

## 2022-01-12 NOTE — Patient Instructions (Addendum)
Medication Instructions:  Your physician recommends that you continue on your current medications as directed. Please refer to the Current Medication list given to you today.  *If you need a refill on your cardiac medications before your next appointment, please call your pharmacy*   Lab Work: Lipid panel today If you have labs (blood work) drawn today and your tests are completely normal, you will receive your results only by: [place] (if you have MyChart) OR A paper copy in the mail If you have any lab test that is abnormal or we need to change your treatment, we will call you to review the results.   Testing/Procedures: Your physician has requested that you have an echocardiogram. Echocardiography is a painless test that uses sound waves to create images of your heart. It provides your doctor with information about the size and shape of your heart and how well your heart's chambers and valves are working. This procedure takes approximately one hour. There are no restrictions for this procedure. Please do NOT wear cologne, perfume, aftershave, or lotions (deodorant is allowed). Please arrive 15 minutes prior to your appointment time.    Follow-Up: At [facility], you and your health needs are our priority.  As part of our continuing mission to provide you with exceptional heart care, we have created designated Provider Care Teams.  These Care Teams include your primary Cardiologist (physician) and Advanced Practice Providers (APPs -  Physician Assistants and Nurse Practitioners) who all work together to provide you with the care you need, when you need it.  We recommend signing up for the patient portal called "MyChart".  Sign up information is provided on this After Visit Summary.  MyChart is used to connect with patients for Virtual Visits (Telemedicine).  Patients are able to view lab/test results, encounter notes, upcoming appointments, etc.  Non-urgent messages can be sent to  your provider as well.   To learn more about what you can do with MyChart, go to [email or web address].    Your next appointment:   6 month(s)  The format for your next appointment:   In Person  Provider:   Jenne Campus, MD    Other Instructions NA

## 2022-01-12 NOTE — Progress Notes (Signed)
Cardiology Office Note:    Date:  01/12/2022   ID:  Brett Rosales, DOB 05-05-1945, MRN [id number]  PCP:  Ronita Hipps, MD  Cardiologist:  Jenne Campus, MD    Referring MD: Ronita Hipps, MD   Chief Complaint  Patient presents with   Medication Management    Refill on all cardiac meds     History of Present Illness:    Brett Rosales is a 76 y.o. male with past medical history significant for paroxysmal atrial flutter, he is anticoagulated, ascending aortic aneurysm measuring 43 mm, dyslipidemia, dyspnea on exertion. He comes today to my office after not being seen more than a year overall doing well he is building a house is very busy doing it have no difficulty walking moving around denies have any chest pain tightness squeezing pressure burning chest no palpitations  Past Medical History:  Diagnosis Date   Acute foreign body of ear canal 11/12/2015   Anxiety    medication made him feel flat stopped meds 09/07/2018   Arthritis    Ascending aortic aneurysm ([place]) 12/23/2017   Bilateral chronic knee pain 08/14/2019   BMI 39.0-39.9,adult 92/42/6834   Complication of anesthesia    Knee block didn't take in 7/21 lt knee   Depression    Dyslipidemia 01/23/2019   Dyspnea on exertion 12/09/2017   Dysrhythmia 11/2017   a flutter   Essential hypertension 12/09/2017   History of kidney stones    13 stones   Hypertension    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   Other chronic pain 01/01/2021   Pain of left hip joint 08/04/2018   Restless sleeper 01/01/2021   Rheumatoid arthritis ([place]) 12/09/2017   Right inguinal pain 06/03/2020   S/P TKR (total knee replacement) using cement, 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 it was inconclusive. needs follow up   Snoring 08/13/2021   Supraventricular tachycardia 12/09/2017   Swelling of left lower extremity 05/17/2018   Typical  atrial flutter ([place]) 12/16/2017    Past Surgical History:  Procedure Laterality Date   TONSILLECTOMY AND ADENOIDECTOMY     TOTAL HIP ARTHROPLASTY Left 09/21/2018   Procedure: TOTAL HIP ARTHROPLASTY ANTERIOR APPROACH;  Surgeon: Rod Can, MD;  Location: [facility];  Service: Orthopedics;  Laterality: Left;   TOTAL KNEE ARTHROPLASTY Left 09/24/2019   Procedure: TOTAL KNEE ARTHROPLASTY;  Surgeon: Vickey Huger, MD;  Location: [facility];  Service: Orthopedics;  Laterality: Left;   TOTAL KNEE ARTHROPLASTY Right 02/04/2020   Procedure: TOTAL KNEE ARTHROPLASTY;  Surgeon: Vickey Huger, MD;  [facility]: [facility];  Service: Orthopedics;  Laterality: Right;   VASECTOMY  1990    Current Medications: Current Meds  Medication Sig   B Complex Vitamins (VITAMIN B COMPLEX) TABS Take 1 tablet by mouth daily.   Cholecalciferol (VITAMIN D3 PO) Take 2,000 Units by mouth daily.   Docusate Sodium (DSS) 100 MG CAPS Take 100 mg by mouth in the morning and at bedtime.   donepezil (ARICEPT) 10 MG tablet Take 1 tablet (10 mg total) by mouth daily.   DULoxetine (CYMBALTA) 30 MG capsule Take 30 mg by mouth daily.   folic acid (FOLVITE) 1 MG tablet Take 1 mg by mouth daily.   inFLIXimab (REMICADE IV) Inject 1 vial into the vein See admin instructions. Every 6 weeks/ Unknown strenght   losartan (COZAAR) 100 MG tablet Take 100 mg by  mouth daily.   memantine (NAMENDA) 10 MG tablet Take 1 tablet (10 mg total) by mouth 2 (two) times daily.   methotrexate 50 MG/2ML injection Inject 25 mg into the skin once a week.    methylphenidate (RITALIN) 20 MG tablet Take 40 mg by mouth daily.    NON FORMULARY Place 1 drop into both eyes as needed (eye sight). For eye sight   rivaroxaban (XARELTO) 20 MG TABS tablet TAKE 1 TABLET BY MOUTH DAILY WITH SUPPER (Patient taking differently: Take 20 mg by mouth daily with supper.)   UNABLE TO FIND Take 1 tablet by mouth daily. Med Name: Lecithin/ Unknown strenght   [DISCONTINUED] ezetimibe (ZETIA)  10 MG tablet Take 10 mg by mouth daily.     Allergies:   Nsaids, Ibuprofen, and Statins   Social History   Socioeconomic History   Marital status: Married    Spouse name: [name]   Number of children: 3   Years of education: Not on file   Highest education level: High school graduate  Occupational History   Not on file  Tobacco Use   Smoking status: Former    Packs/day: 1.00    Years: 15.00    Total pack years: 15.00    Types: Cigarettes    Quit date: 01/29/1986    Years since quitting: 35.9   Smokeless tobacco: Never  Vaping Use   Vaping Use: Never used  Substance and Sexual Activity   Alcohol use: No    Alcohol/week: 0.0 standard drinks of alcohol   Drug use: No   Sexual activity: Not on file  Other Topics Concern   Not on file  Social History Narrative   Lives with wife   Right handed   Caffeine: occa drinks coffee, no energy drink. Ginger tea   Social Determinants of Radio broadcast assistant Strain: Not on file  Food Insecurity: Not on file  Transportation Needs: Not on file  Physical Activity: Not on file  Stress: Not on file  Social Connections: Not on file     Family History: The patient's family history includes Cancer in his mother; Diabetes in his mother and paternal grandmother; Hypertension in his father. ROS:   Please see the history of present illness.    All 14 point review of systems negative except as described per history of present illness  EKGs/Labs/Other Studies Reviewed:      Recent Labs: No results found for requested labs within last 365 days.  Recent Lipid Panel    Component Value Date/Time   CHOL 233 (H) 12/13/2019 1457   TRIG 130 12/13/2019 1457   HDL 88 12/13/2019 1457   CHOLHDL 2.6 12/13/2019 1457   LDLCALC 123 (H) 12/13/2019 1457    Physical Exam:    VS:  BP (!) 142/90 (BP Location: Left Arm, Patient Position: Sitting)   Pulse (!) 56   Ht '6\' 2"'$  (1.88 m)   Wt 256 lb 3.2 oz (116.2 kg)   SpO2 98%   BMI 32.89  kg/m     Wt Readings from Last 3 Encounters:  01/12/22 256 lb 3.2 oz (116.2 kg)  08/13/21 246 lb (111.6 kg)  02/11/21 279 lb (126.6 kg)     GEN:  Well nourished, well developed in no acute distress HEENT: Normal NECK: No JVD; No carotid bruits LYMPHATICS: No lymphadenopathy CARDIAC: RRR, no murmurs, no rubs, no gallops RESPIRATORY:  Clear to auscultation without rales, wheezing or rhonchi  ABDOMEN: Soft, non-tender, non-distended MUSCULOSKELETAL:  No edema;  No deformity  SKIN: Warm and dry LOWER EXTREMITIES: no swelling NEUROLOGIC:  Alert and oriented x 3 PSYCHIATRIC:  Normal affect   ASSESSMENT:    1. Typical atrial flutter ([place])   2. Dyslipidemia   3. Dyspnea on exertion   4. Supraventricular tachycardia   5. Essential hypertension   6. Aneurysm of ascending aorta without rupture (HCC)    PLAN:    In order of problems listed above:  Paroxysmal typical atrial flutter.  Denies have any palpitation continue anticoagulation.  I will schedule him to have echocardiogram to look at the atrial size distally and directly will be able to tell if he does have some recurrences of this arrhythmia. Dyslipidemia I did review his K PN which only his LDL of iron 23 however this data is from October 2021, I will schedule him to have fasting lipid profile today Dyspnea and exertion I will ask him to have an echocardiogram done.  I do this also to look at the ascending aortic aneurysm, as well as look at the atrial size. Essential hypertension blood pressure mildly elevated but he said that he checked his blood pressure at home and seems to be doing well   Medication Adjustments/Labs and Tests Ordered: Current medicines are reviewed at length with the patient today.  Concerns regarding medicines are outlined above.  Orders Placed This Encounter  Procedures   Lipid panel   EKG 12-Lead   ECHOCARDIOGRAM COMPLETE   Medication changes: No orders of the defined types were placed in this  encounter.   Signed, Park Liter, MD, [facility] 01/12/2022 4:44 PM    [place]

## 2022-01-13 LAB — LIPID PANEL
Chol/HDL Ratio: 2.1 ratio (ref 0.0–5.0)
Cholesterol, Total: 248 mg/dL — ABNORMAL HIGH (ref 100–199)
HDL: 116 mg/dL (ref >39)
LDL Chol Calc (NIH): 121 mg/dL — ABNORMAL HIGH (ref 0–99)
Triglycerides: 67 mg/dL (ref 0–149)
VLDL Cholesterol Cal: 11 mg/dL (ref 5–40)

## 2022-01-25 DIAGNOSIS — M0609 Rheumatoid arthritis without rheumatoid factor, multiple sites: Secondary | ICD-10-CM | POA: Diagnosis not present

## 2022-01-26 ENCOUNTER — Other Ambulatory Visit: Payer: Self-pay | Admitting: Cardiology

## 2022-01-26 ENCOUNTER — Ambulatory Visit: Payer: Medicare Other | Attending: Cardiology

## 2022-01-26 DIAGNOSIS — R0609 Other forms of dyspnea: Secondary | ICD-10-CM | POA: Insufficient documentation

## 2022-01-26 LAB — ECHOCARDIOGRAM COMPLETE
Area-P 1/2: 2.71 cm2
P 1/2 time: 861 msec
S' Lateral: 3.8 cm

## 2022-02-05 DIAGNOSIS — M47816 Spondylosis without myelopathy or radiculopathy, lumbar region: Secondary | ICD-10-CM | POA: Diagnosis not present

## 2022-02-05 DIAGNOSIS — M47896 Other spondylosis, lumbar region: Secondary | ICD-10-CM | POA: Diagnosis not present

## 2022-02-08 DIAGNOSIS — F988 Other specified behavioral and emotional disorders with onset usually occurring in childhood and adolescence: Secondary | ICD-10-CM | POA: Diagnosis not present

## 2022-02-08 DIAGNOSIS — J40 Bronchitis, not specified as acute or chronic: Secondary | ICD-10-CM | POA: Diagnosis not present

## 2022-02-08 DIAGNOSIS — Z0000 Encounter for general adult medical examination without abnormal findings: Secondary | ICD-10-CM | POA: Diagnosis not present

## 2022-02-08 DIAGNOSIS — J329 Chronic sinusitis, unspecified: Secondary | ICD-10-CM | POA: Diagnosis not present

## 2022-02-09 ENCOUNTER — Telehealth: Payer: Self-pay | Admitting: Cardiology

## 2022-02-09 DIAGNOSIS — E785 Hyperlipidemia, unspecified: Secondary | ICD-10-CM

## 2022-02-09 MED ORDER — PRAVASTATIN SODIUM 40 MG PO TABS: 40.0000 mg | ORAL_TABLET | Freq: Every evening | ORAL | 3 refills | Status: DC

## 2022-02-09 NOTE — Telephone Encounter (Signed)
Spoke with pt about lab reults and Dr. Wendy Poet recommendations. Pt agreed to try Pravastatin '40mg'$  q d. Sent to [facility]. Pt will come back in 6 weeks for follow up blood work. Encouraged to call with any questions or concerns.

## 2022-02-09 NOTE — Telephone Encounter (Signed)
Pt is returning in regards in results. Requesting call back.

## 2022-02-16 ENCOUNTER — Ambulatory Visit (INDEPENDENT_AMBULATORY_CARE_PROVIDER_SITE_OTHER): Payer: Medicare Other | Admitting: Neurology

## 2022-02-16 ENCOUNTER — Encounter: Payer: Self-pay | Admitting: Neurology

## 2022-02-16 ENCOUNTER — Telehealth: Payer: Self-pay | Admitting: Neurology

## 2022-02-16 DIAGNOSIS — R4182 Altered mental status, unspecified: Secondary | ICD-10-CM | POA: Diagnosis not present

## 2022-02-16 DIAGNOSIS — F09 Unspecified mental disorder due to known physiological condition: Secondary | ICD-10-CM

## 2022-02-16 MED ORDER — MEMANTINE HCL 10 MG PO TABS: 10.0000 mg | ORAL_TABLET | Freq: Two times a day (BID) | ORAL | 3 refills | Status: DC

## 2022-02-16 MED ORDER — DONEPEZIL HCL 10 MG PO TABS: 10.0000 mg | ORAL_TABLET | Freq: Every day | ORAL | 3 refills | Status: DC

## 2022-02-16 NOTE — Progress Notes (Signed)
PATIENT: Brett Rosales DOB: January 22, 1946  REASON FOR VISIT: Follow up HISTORY FROM: Patient, wife PRIMARY NEUROLOGIST: Dr. Krista Blue, Dr. Brett Fairy for sleep consult  HISTORY  07/29/2020 Dr. Krista Blue: Brett Rosales, is a 76 year old male, seen in request by his primary care physician Dr. Helene Kelp, Abagail Kitchens for evaluation of memory loss, excessive fatigue, he is accompanied by his wife at today's visit on Jul 30, 2020   I reviewed and summarized the referring note. PMHX. 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 at home, has multiple degenerative joint disease, chronic low back pain, has been treated as rheumatoid arthritis for many years, use different medications, including Remicade, methotrexate, also had a history of atrial fibrillation, on chronic Xarelto   He was noted to have gradual onset of memory loss since his multiple joint replacement surgery in 2020, he had left hip replacement in 2020, left knee replacement in summer 2021, and right knee replacement in November 2021   Following all those general anesthesia, he was noted to have some changes, he often forgets to clean himself up after project, he used to be very good with direction, now often misses a turn when driving,   There was no family history of dementia, he was put on Aricept 5 mg few months ago, while he was taking every night, he complains of excessive dreams, improved after move the dosage to every morning, he is independent in his daily activity, MoCA examination is 23/30 today   Because of his multiple joints pain, he did not exercise regularly, did have weight gain, used to have loud snoring, sometimes up in the middle of the night catching his breath  Update February 11, 2021 SS: Had rotator cuff surgery 3 weeks ago, on the left arm. Saw Dr.  Brett Fairy to have sleep study, didn't schedule yet.  B12, RPR, TSH were normal May 2022.  MRI of the brain in July 2022 showed no acute abnormality, mild age-related atrophy SVD, moderate paranasal sinus disease  Taking Aricept 10 mg AM, noticed improvement reported by his wife, no more side effects. He is remembering things better, not asking as much repetitive questioning. Is driving, not getting lost. In stressful situations, can't process what he needs to do as quickly as before, will start stuttering, gets frustrated. They are building a house which is stressful.   MOCA today was 28/30.  Update August 13, 2021 SS: [facility] 24/30, here with his wife, worsening memory loss, forgets household chores/tasks, stress from building a home, he did the hot Occupational psychologist, electrical wiring. Drives a car okay for now. Remains on Aricept 10 mg daily, his wife manages pill box. Never had sleep test, claims snoring is less. Taken 2 sleep tests in the past, was told he had sleep apnea, but data wasn't good, never came for in lab. Gets flustered with questioning. Takes Ritalin. Feels more irritable. Has lost 40 lbs in the last year, with increasing activity.  Update February 16, 2022: [place] 25/30. Started namenda 10 mg BID last visit, remains on Aricept 10 mg daily. He feels memory is better. Staying on task better. He is managing building a new home, he is doing a good job. He is putting in cabinets, electrical work. Drives a car, no issues. No changes in behavioral, wife thinks is normal. Not interested  in sleep study. Snores less.   REVIEW OF SYSTEMS: Out of a complete 14 system review of symptoms, the patient complains only of the following symptoms, and all other reviewed systems are negative.  See HPI  ALLERGIES: Allergies  Allergen Reactions   Nsaids Other (See Comments)    "Eats hole in stomach".Marland KitchenMarland KitchenMarland KitchenIbuprofen, Meloxicam...   Ibuprofen     " Causes ulcers in my stomach."   Statins Other (See  Comments)    Myalgias    HOME MEDICATIONS: Outpatient Medications Prior to Visit  Medication Sig Dispense Refill   B Complex Vitamins (VITAMIN B COMPLEX) TABS Take 1 tablet by mouth daily.     Cholecalciferol (VITAMIN D3 PO) Take 2,000 Units by mouth daily.     Docusate Sodium (DSS) 100 MG CAPS Take 100 mg by mouth in the morning and at bedtime.     DULoxetine (CYMBALTA) 30 MG capsule Take 30 mg by mouth daily.     folic acid (FOLVITE) 1 MG tablet Take 1 mg by mouth daily.     inFLIXimab (REMICADE IV) Inject 1 vial into the vein See admin instructions. Every 6 weeks/ Unknown strenght     losartan (COZAAR) 100 MG tablet Take 100 mg by mouth daily.     methotrexate 50 MG/2ML injection Inject 25 mg into the skin once a week.      methylphenidate (RITALIN) 20 MG tablet Take 40 mg by mouth daily.      metoprolol tartrate (LOPRESSOR) 25 MG tablet TAKE 1 TABLET TWICE A DAY (NEED APPOINTMENT FOR FUTURE REFILLS) 180 tablet 2   NON FORMULARY Place 1 drop into both eyes as needed (eye sight). For eye sight     pravastatin (PRAVACHOL) 40 MG tablet Take 1 tablet (40 mg total) by mouth every evening. 30 tablet 3   Red Yeast Rice Extract (RED YEAST RICE PO) Take 2 capsules by mouth daily.     rivaroxaban (XARELTO) 20 MG TABS tablet TAKE 1 TABLET BY MOUTH DAILY WITH SUPPER (Patient taking differently: Take 20 mg by mouth daily with supper.) 90 tablet 0   donepezil (ARICEPT) 10 MG tablet Take 1 tablet (10 mg total) by mouth daily. 90 tablet 3   memantine (NAMENDA) 10 MG tablet Take 1 tablet (10 mg total) by mouth 2 (two) times daily. 60 tablet 5   UNABLE TO FIND Take 1 tablet by mouth daily. Med Name: Lecithin/ Unknown strenght     No facility-administered medications prior to visit.    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 it was inconclusive. needs follow up   Snoring 08/13/2021   Supraventricular tachycardia 12/09/2017   Swelling of left lower extremity 05/17/2018   Typical atrial flutter ([place]) 12/16/2017    PAST SURGICAL HISTORY: Past Surgical History:  Procedure Laterality Date   TONSILLECTOMY AND ADENOIDECTOMY     TOTAL HIP ARTHROPLASTY Left 09/21/2018   Procedure: TOTAL HIP ARTHROPLASTY ANTERIOR APPROACH;  Surgeon: Rod Can, MD;  Location: [facility];  Service: Orthopedics;  Laterality: Left;   TOTAL KNEE ARTHROPLASTY Left 09/24/2019   Procedure: TOTAL KNEE ARTHROPLASTY;  Surgeon: Vickey Huger, MD;  [facility];  Service: Orthopedics;  Laterality: Left;   TOTAL KNEE ARTHROPLASTY Right 02/04/2020   Procedure: TOTAL KNEE ARTHROPLASTY;  Surgeon: Vickey Huger, MD;  [facility];  Service: Orthopedics;  Laterality: Right;   VASECTOMY  1990    FAMILY HISTORY: Family History  Problem Relation Age of Onset   Diabetes Mother    Cancer Mother    Hypertension Father    Diabetes Paternal Grandmother     SOCIAL HISTORY: Social History   Socioeconomic History   Marital status: Married    Spouse name: [name]   Number of  children: 3   Years of education: Not on file   Highest education level: High school graduate  Occupational History   Not on file  Tobacco Use   Smoking status: Former    Packs/day: 1.00    Years: 15.00    Total pack years: 15.00    Types: Cigarettes    Quit date: 01/29/1986    Years since quitting: 36.0   Smokeless tobacco: Never  Vaping Use   Vaping Use: Never used  Substance and Sexual Activity   Alcohol use: No    Alcohol/week: 0.0 standard drinks of alcohol   Drug use: No   Sexual activity: Not on file  Other Topics Concern   Not on file  Social History Narrative   Lives with wife   Right handed   Caffeine: occa drinks coffee, no energy drink. Ginger tea   Social Determinants of Health   Financial Resource Strain: Not on file  Food Insecurity: Not on file  Transportation Needs: Not on file  Physical Activity: Not on file  Stress: Not on file  Social Connections: Not on file  Intimate Partner Violence: Not on file       02/16/2022    2:52 PM 08/13/2021    1:38 PM 02/11/2021    2:47 PM 07/29/2020    3:22 PM  Montreal Cognitive Assessment   Visuospatial/ Executive (0/5) '5 5 5 4  '$ Naming (0/3) '2 3 3 2  '$ Attention: Read list of digits (0/2) '2 2 2 2  '$ Attention: Read list of letters (0/1) '1 1 1 '$ 0  Attention: Serial 7 subtraction starting at 100 (0/3) '2 3 3 3  '$ Language: Repeat phrase (0/2) '2 1 2 2  '$ Language : Fluency (0/1) 0 '1 1 1  '$ Abstraction (0/2) '2 2 2 2  '$ Delayed Recall (0/5) '3 1 4 1  '$ Orientation (0/6) '5 5 5 6  '$ Total '24 24 28 23  '$ Adjusted Score (based on education) 25      PHYSICAL EXAM  Vitals:   02/16/22 1449  BP: (!) 153/77  Pulse: (!) 52  Weight: 257 lb (116.6 kg)  Height: '6\' 2"'$  (1.88 m)     Body mass index is 33 kg/m.  Generalized: Well developed, in  no acute distress, very pleasant, makes jokes Neurological examination  Mentation: Alert oriented to time, place, history taking. Follows all commands speech and language fluent Cranial nerve  II-XII: Pupils were equal round reactive to light. Extraocular movements were full, visual field were full on confrontational test. Facial sensation and strength were normal.  Motor: Good strength throughout, 4/5 bilateral hip flexion due to back pain Sensory: Sensory testing is intact to soft touch on all 4 extremities. No evidence of extinction is noted.  Coordination: Cerebellar testing reveals good finger-nose-finger bilaterally  Gait and station: Gait is normal , indpendent Reflexes: Deep tendon reflexes are symmetric but decreased throughout  DIAGNOSTIC DATA (LABS, IMAGING, TESTING) - I reviewed patient records, labs, notes, testing and imaging myself where available.  Lab Results  Component Value Date   WBC 5.3 01/30/2020   HGB 14.1 01/30/2020   HCT 44.8 01/30/2020   MCV 95.5 01/30/2020   PLT 247 01/30/2020      Component Value Date/Time   NA 140 01/30/2020 1142   K 5.2 (H) 01/30/2020 1142   CL 110 01/30/2020 1142   CO2 23 01/30/2020 1142   GLUCOSE 99 01/30/2020 1142   BUN 27 (H) 01/30/2020 1142   CREATININE 1.09 01/30/2020 1142   CALCIUM 9.0 01/30/2020 1142   PROT 6.9 01/30/2020 1142   ALBUMIN 3.9 01/30/2020 1142   AST 15 01/30/2020 1142   ALT 17 01/30/2020 1142   ALKPHOS 72 01/30/2020 1142   BILITOT 0.5 01/30/2020 1142   GFRNONAA >60 01/30/2020 1142   GFRAA >60 09/20/2019 1204   Lab Results  Component Value Date   CHOL 248 (H) 01/12/2022   HDL 116 01/12/2022   LDLCALC 121 (H) 01/12/2022   TRIG 67 01/12/2022   CHOLHDL 2.1 01/12/2022   No results found for: "HGBA1C" Lab Results  Component Value Date   VITAMINB12 360 07/29/2020   Lab Results  Component Value Date   TSH 2.320 07/29/2020   ASSESSMENT AND PLAN 76 y.o. year old male   Cognitive impairment Excessive snoring, fatigue  -MOCA 25/30 (24/30 prior) -Continue Namenda 10 mg twice a day, Aricept 10 mg daily -Remains uninterested in sleep study -Referral for neuropsychological evaluation to  better categorize memory issues, began after 2020 several orthopedic procedures with general anesthesia -MRI of the brain in July 2022 showed no acute abnormality, mild age-related atrophy SVD, moderate paranasal sinus disease -B12, TSH, RPR were normal -Follow-up in 6 months or sooner if needed  Butler Denmark, Laqueta Jean, DNP 02/16/2022, 3:26 PM [facility] [address], [place], [place] [address] [phone]

## 2022-02-16 NOTE — Telephone Encounter (Signed)
Referral for neuropsychology sent through [facility] to [facility], Dr. Ilean Skill. Phone: [phone]

## 2022-02-16 NOTE — Patient Instructions (Signed)
Continue current medication  I will refer you for neuropsychological results

## 2022-02-22 DIAGNOSIS — M5136 Other intervertebral disc degeneration, lumbar region: Secondary | ICD-10-CM | POA: Diagnosis not present

## 2022-02-22 DIAGNOSIS — M47896 Other spondylosis, lumbar region: Secondary | ICD-10-CM | POA: Diagnosis not present

## 2022-02-23 ENCOUNTER — Encounter: Payer: Self-pay | Admitting: Psychology

## 2022-03-10 DIAGNOSIS — Z79899 Other long term (current) drug therapy: Secondary | ICD-10-CM | POA: Diagnosis not present

## 2022-03-10 DIAGNOSIS — R5383 Other fatigue: Secondary | ICD-10-CM | POA: Diagnosis not present

## 2022-03-10 DIAGNOSIS — M0609 Rheumatoid arthritis without rheumatoid factor, multiple sites: Secondary | ICD-10-CM | POA: Diagnosis not present

## 2022-03-10 DIAGNOSIS — Z111 Encounter for screening for respiratory tuberculosis: Secondary | ICD-10-CM | POA: Diagnosis not present

## 2022-03-15 DIAGNOSIS — E7800 Pure hypercholesterolemia, unspecified: Secondary | ICD-10-CM | POA: Diagnosis not present

## 2022-03-15 DIAGNOSIS — I10 Essential (primary) hypertension: Secondary | ICD-10-CM | POA: Diagnosis not present

## 2022-03-15 DIAGNOSIS — Z79899 Other long term (current) drug therapy: Secondary | ICD-10-CM | POA: Diagnosis not present

## 2022-04-01 ENCOUNTER — Telehealth: Payer: Self-pay

## 2022-04-01 NOTE — Patient Outreach (Signed)
  Care Coordination   Initial Visit Note   04/01/2022 Name: Brett Rosales MRN: [id number] DOB: 12-14-1945  Brett Rosales is a 77 y.o. year old male who sees Routh, Nyoka Cowden, NP for primary care. I spoke with  Brett Rosales by phone today.Patient requested I speak with his wife Brett Rosales  What matters to the patients health and wellness today?  Placed call to patient and wife and reviewed [facility] program.  Wife reports patient is doing well and denies any needs at this time.     SDOH assessments and interventions completed:  No     Care Coordination Interventions:  No, not indicated   Follow up plan: No further intervention required.   Encounter Outcome:  Pt. Refused   Tomasa Rand, RN, BSN, CEN [facility] [employer] [phone]

## 2022-04-21 DIAGNOSIS — M0609 Rheumatoid arthritis without rheumatoid factor, multiple sites: Secondary | ICD-10-CM | POA: Diagnosis not present

## 2022-04-29 ENCOUNTER — Telehealth: Payer: Self-pay

## 2022-04-29 NOTE — Telephone Encounter (Signed)
Spoke with the patient regarding denial letter, aware I'll be mailing the forms with instruction to be complete. Aware to mail back w/o completing the providers portion and to attach his proof of income.

## 2022-04-29 NOTE — Telephone Encounter (Signed)
LM informing the patient tier exception for Xarelto denied because his Medicare part D is currently at the lowest cost. Letter processed to scan.

## 2022-06-02 DIAGNOSIS — M0609 Rheumatoid arthritis without rheumatoid factor, multiple sites: Secondary | ICD-10-CM | POA: Diagnosis not present

## 2022-06-02 DIAGNOSIS — Z79899 Other long term (current) drug therapy: Secondary | ICD-10-CM | POA: Diagnosis not present

## 2022-06-09 ENCOUNTER — Other Ambulatory Visit: Payer: Self-pay | Admitting: Cardiology

## 2022-06-09 DIAGNOSIS — I4892 Unspecified atrial flutter: Secondary | ICD-10-CM

## 2022-06-10 NOTE — Telephone Encounter (Signed)
Prescription refill request for Xarelto received.  Indication: A Flutter Last office visit: 01/12/22  Nelta Numbers MD Weight: 116.2kg Age: 77 Scr: 1.04 on 12/14/21  [facility]PN CrCl: 102.42  Based on above findings Xarelto 20mg  daily is the appropriate dose.  Refill approved.

## 2022-07-14 DIAGNOSIS — M79642 Pain in left hand: Secondary | ICD-10-CM | POA: Diagnosis not present

## 2022-07-14 DIAGNOSIS — M0609 Rheumatoid arthritis without rheumatoid factor, multiple sites: Secondary | ICD-10-CM | POA: Diagnosis not present

## 2022-07-14 DIAGNOSIS — Z6834 Body mass index (BMI) 34.0-34.9, adult: Secondary | ICD-10-CM | POA: Diagnosis not present

## 2022-07-14 DIAGNOSIS — Z79899 Other long term (current) drug therapy: Secondary | ICD-10-CM | POA: Diagnosis not present

## 2022-07-14 DIAGNOSIS — M1991 Primary osteoarthritis, unspecified site: Secondary | ICD-10-CM | POA: Diagnosis not present

## 2022-07-14 DIAGNOSIS — E669 Obesity, unspecified: Secondary | ICD-10-CM | POA: Diagnosis not present

## 2022-07-14 DIAGNOSIS — M79641 Pain in right hand: Secondary | ICD-10-CM | POA: Diagnosis not present

## 2022-07-21 DIAGNOSIS — M0609 Rheumatoid arthritis without rheumatoid factor, multiple sites: Secondary | ICD-10-CM | POA: Diagnosis not present

## 2022-08-17 NOTE — Progress Notes (Addendum)
PATIENT: Brett Rosales DOB: 1946/02/18  REASON FOR VISIT: Follow up HISTORY FROM: Patient, wife PRIMARY NEUROLOGIST: Dr. Terrace Arabia, Dr. Vickey Huger for sleep consult  HISTORY  07/29/2020 Dr. Terrace Arabia: Brett Rosales, is a 77 year old male, seen in request by his primary care physician Dr. Leonor Liv, Mendel Corning for evaluation of memory loss, excessive fatigue, he is accompanied by his wife at today's visit on Jul 30, 2020   I reviewed and summarized the referring note. PMHX. 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, has multiple degenerative joint disease, chronic low back pain, has been treated as rheumatoid arthritis for many years, use different medications, including Remicade, methotrexate, also had a history of atrial fibrillation, on chronic Xarelto   He was noted to have gradual onset of memory loss since his multiple joint replacement surgery in 2020, he had left hip replacement in 2020, left knee replacement in summer 2021, and right knee replacement in November 2021   Following all those general anesthesia, he was noted to have some changes, he often forgets to clean himself up after project, he used to be very good with direction, now often misses a turn when driving,   There was no family history of dementia, he was put on Aricept 5 mg few months ago, while he was taking every night, he complains of excessive dreams, improved after move the dosage to every morning, he is independent in his daily activity, MoCA examination is 23/30 today   Because of his multiple joints pain, he did not exercise regularly, did have weight gain, used to have loud snoring, sometimes up in the middle of the night catching his breath  Update February 11, 2021 SS: Had rotator cuff surgery 3 weeks ago, on the left arm. Saw Dr.  Vickey Huger to have sleep study, didn't schedule yet.  B12, RPR, TSH were normal May 2022.  MRI of the brain in July 2022 showed no acute abnormality, mild age-related atrophy SVD, moderate paranasal sinus disease  Taking Aricept 10 mg AM, noticed improvement reported by his wife, no more side effects. He is remembering things better, not asking as much repetitive questioning. Is driving, not getting lost. In stressful situations, can't process what he needs to do as quickly as before, will start stuttering, gets frustrated. They are building a house which is stressful.   MOCA today was 28/30.  Update August 13, 2021 SS: [facility] 24/30, here with his wife, worsening memory loss, forgets household chores/tasks, stress from building a home, he did the hot Insurance underwriter, electrical wiring. Drives a car okay for now. Remains on Aricept 10 mg daily, his wife manages pill box. Never had sleep test, claims snoring is less. Taken 2 sleep tests in the past, was told he had sleep apnea, but data wasn't good, never came for in lab. Gets flustered with questioning. Takes Ritalin. Feels more irritable. Has lost 40 lbs in the last year, with increasing activity.  Update February 16, 2022: MOCA 25/30. Started namenda 10 mg BID last visit, remains on Aricept 10 mg daily. He feels memory is better. Staying on task better. He is managing building a new home, he is doing a good job. He is putting in cabinets, electrical work. Drives a car, no issues. No changes in behavioral, wife thinks is normal. Not interested in  sleep study. Snores less.   Update August 18, 2022 SS: Last visit was referred to neuropsych, didn't have it, scheduled for next week with Dr. Kieth Brightly.  Has continued on Namenda 10 mg BID, Aricept 10 mg daily. Today MOCA 25/30. He feels memory is better with change in season, feels mind is clearer and sharper with the longer daytime hours. He drives well. Wife has not noted any worsening. His house will be  built in the summer, he has done most of the work. He does have trouble with measurements, may cut it short. Has vivid dreams, still takes Aricept in the AM, will last for few nights then go away. Not interested in sleep study, still snores. He makes jokes.   REVIEW OF SYSTEMS: Out of a complete 14 system review of symptoms, the patient complains only of the following symptoms, and all other reviewed systems are negative.  See HPI  ALLERGIES: Allergies  Allergen Reactions   Nsaids Other (See Comments)    "Eats hole in stomach".Marland KitchenMarland KitchenMarland KitchenIbuprofen, Meloxicam...  Other Reaction(s): GI Intolerance   Ibuprofen     " Causes ulcers in my stomach."   Statins Other (See Comments)    Myalgias    HOME MEDICATIONS: Outpatient Medications Prior to Visit  Medication Sig Dispense Refill   B Complex Vitamins (VITAMIN B COMPLEX) TABS Take 1 tablet by mouth daily.     Cholecalciferol (VITAMIN D3 PO) Take 2,000 Units by mouth daily.     Docusate Sodium (DSS) 100 MG CAPS Take 100 mg by mouth in the morning and at bedtime.     DULoxetine (CYMBALTA) 30 MG capsule Take 30 mg by mouth daily.     folic acid (FOLVITE) 1 MG tablet Take 1 mg by mouth daily.     hydroxychloroquine (PLAQUENIL) 200 MG tablet Take 200 mg by mouth 2 (two) times daily.     inFLIXimab (REMICADE IV) Inject 1 vial into the vein See admin instructions. Every 6 weeks/ Unknown strenght     losartan (COZAAR) 100 MG tablet Take 100 mg by mouth daily.     methotrexate 50 MG/2ML injection Inject 25 mg into the skin once a week.      methylphenidate (RITALIN) 20 MG tablet Take 40 mg by mouth daily.      metoprolol tartrate (LOPRESSOR) 25 MG tablet TAKE 1 TABLET TWICE A DAY (NEED APPOINTMENT FOR FUTURE REFILLS) 180 tablet 2   NON FORMULARY Place 1 drop into both eyes as needed (eye sight). For eye sight     pravastatin (PRAVACHOL) 40 MG tablet Take 1 tablet (40 mg total) by mouth every evening. 30 tablet 3   Red Yeast Rice Extract (RED YEAST RICE  PO) Take 2 capsules by mouth daily.     rivaroxaban (XARELTO) 20 MG TABS tablet TAKE 1 TABLET BY MOUTH DAILY WITH SUPPER 90 tablet 1   donepezil (ARICEPT) 10 MG tablet Take 1 tablet (10 mg total) by mouth daily. 90 tablet 3   memantine (NAMENDA) 10 MG tablet Take 1 tablet (10 mg total) by mouth 2 (two) times daily. 180 tablet 3   No facility-administered medications prior to visit.    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 it was inconclusive. needs follow up   Snoring 08/13/2021   Supraventricular tachycardia 12/09/2017   Swelling of left lower extremity 05/17/2018   Typical atrial flutter (HCC) 12/16/2017    PAST SURGICAL HISTORY: Past Surgical History:  Procedure Laterality Date   TONSILLECTOMY AND ADENOIDECTOMY     TOTAL HIP ARTHROPLASTY Left 09/21/2018   Procedure: TOTAL HIP ARTHROPLASTY ANTERIOR APPROACH;  Surgeon: Samson Frederic, MD;  Location: [facility];  Service: Orthopedics;  Laterality: Left;   TOTAL KNEE ARTHROPLASTY Left 09/24/2019    Procedure: TOTAL KNEE ARTHROPLASTY;  Surgeon: Dannielle Huh, MD;  Location: [facility];  Service: Orthopedics;  Laterality: Left;   TOTAL KNEE ARTHROPLASTY Right 02/04/2020   Procedure: TOTAL KNEE ARTHROPLASTY;  Surgeon: Dannielle Huh, MD;  Location: [facility];  Service: Orthopedics;  Laterality: Right;   VASECTOMY  1990    FAMILY HISTORY: Family History  Problem Relation Age of Onset   Diabetes Mother    Cancer Mother    Hypertension Father    Diabetes Paternal Grandmother     SOCIAL HISTORY: Social History   Socioeconomic History   Marital status: Married    Spouse name: [name]   Number of children: 3   Years of education: Not on file   Highest education level: High school graduate  Occupational History   Not on file  Tobacco Use   Smoking status: Former    Packs/day: 1.00    Years: 15.00    Additional pack years: 0.00    Total pack years: 15.00    Types: Cigarettes    Quit date: 01/29/1986    Years since quitting: 36.5   Smokeless tobacco: Never  Vaping Use   Vaping Use: Never used  Substance and Sexual Activity   Alcohol use: No    Alcohol/week: 0.0 standard drinks of alcohol   Drug use: No   Sexual activity: Yes    Birth control/protection: None  Other Topics Concern   Not on file  Social History Narrative   Lives with wife   Right handed   Caffeine: occa drinks coffee, no energy drink. Ginger tea   Social Determinants of Health   Financial Resource Strain: Not on file  Food Insecurity: Not on file  Transportation Needs: Not on file  Physical Activity: Not on file  Stress: Not on file  Social Connections: Not on file  Intimate Partner Violence: Not on file       08/18/2022   11:23 AM 02/16/2022    2:52 PM 08/13/2021    1:38 PM 02/11/2021    2:47 PM 07/29/2020    3:22 PM  Montreal Cognitive Assessment   Visuospatial/ Executive (0/5) 5 5 5 5 4   Naming (0/3) 3 2 3 3 2   Attention: Read list of digits (  0/2) 1 2 2 2 2   Attention: Read list of letters (0/1) 1 1  1 1  0  Attention: Serial 7 subtraction starting at 100 (0/3) 2 2 3 3 3   Language: Repeat phrase (0/2) 2 2 1 2 2   Language : Fluency (0/1) 1 0 1 1 1   Abstraction (0/2) 2 2 2 2 2   Delayed Recall (0/5) 2 3 1 4 1   Orientation (0/6) 5 5 5 5 6   Total 24 24 24 28 23   Adjusted Score (based on education) 25 25      PHYSICAL EXAM  Vitals:   08/18/22 1120 08/18/22 1132  BP: (!) 150/88 (!) 144/90  Pulse: (!) 52   Weight: 269 lb 8 oz (122.2 kg)   Height: 6\' 2"  (1.88 m)     Body mass index is 34.6 kg/m.  Generalized: Well developed, in no acute distress, very pleasant, makes jokes Neurological examination  Mentation: Alert oriented to time, place, history taking. Follows all commands speech and language fluent Cranial nerve II-XII: Pupils were equal round reactive to light. Extraocular movements were full, visual field were full on confrontational test. Facial sensation and strength were normal.  Motor: Good strength throughout Sensory: Sensory testing is intact to soft touch on all 4 extremities. No evidence of extinction is noted.  Coordination: Cerebellar testing reveals good finger-nose-finger bilaterally  Gait and station: Gait is normal , indpendent Reflexes: Deep tendon reflexes are symmetric but decreased throughout  DIAGNOSTIC DATA (LABS, IMAGING, TESTING) - I reviewed patient records, labs, notes, testing and imaging myself where available.  Lab Results  Component Value Date   WBC 5.3 01/30/2020   HGB 14.1 01/30/2020   HCT 44.8 01/30/2020   MCV 95.5 01/30/2020   PLT 247 01/30/2020      Component Value Date/Time   NA 140 01/30/2020 1142   K 5.2 (H) 01/30/2020 1142   CL 110 01/30/2020 1142   CO2 23 01/30/2020 1142   GLUCOSE 99 01/30/2020 1142   BUN 27 (H) 01/30/2020 1142   CREATININE 1.09 01/30/2020 1142   CALCIUM 9.0 01/30/2020 1142   PROT 6.9 01/30/2020 1142   ALBUMIN 3.9 01/30/2020 1142   AST 15 01/30/2020 1142   ALT 17 01/30/2020 1142   ALKPHOS 72 01/30/2020  1142   BILITOT 0.5 01/30/2020 1142   GFRNONAA >60 01/30/2020 1142   GFRAA >60 09/20/2019 1204   Lab Results  Component Value Date   CHOL 248 (H) 01/12/2022   HDL 116 01/12/2022   LDLCALC 121 (H) 01/12/2022   TRIG 67 01/12/2022   CHOLHDL 2.1 01/12/2022   No results found for: "HGBA1C" Lab Results  Component Value Date   VITAMINB12 360 07/29/2020   Lab Results  Component Value Date   TSH 2.320 07/29/2020   ASSESSMENT AND PLAN 77 y.o. year old male   Cognitive impairment Excessive snoring, fatigue  -MOCA 25/30 (25/30 prior) -Continue Namenda 10 mg twice a day, Aricept 10 mg daily -Remains uninterested in sleep study -Having neuropsychological testing next week with Dr. Kieth Brightly to better categorize memory issues, began after 2020 several orthopedic procedures with general anesthesia -MRI of the brain in July 2022 showed no acute abnormality, mild age-related atrophy SVD, moderate paranasal sinus disease -B12, TSH, RPR were normal -Follow-up in 6 months or sooner if needed  Addendum 09/21/22 SS: HST, PSG ordered for sleep concerns: snoring, memory loss, apnea, vivid dreams. ESS 1.  Margie Ege, AGNP-C, DNP 08/18/2022, 11:52 AM [facility] [address], [address]  [place], [place] [address] [phone]

## 2022-08-18 ENCOUNTER — Encounter: Payer: Self-pay | Admitting: Neurology

## 2022-08-18 ENCOUNTER — Ambulatory Visit (INDEPENDENT_AMBULATORY_CARE_PROVIDER_SITE_OTHER): Payer: Medicare Other | Admitting: Neurology

## 2022-08-18 VITALS — BP 144/90 | HR 52 | Ht 74.0 in | Wt 269.5 lb

## 2022-08-18 DIAGNOSIS — F09 Unspecified mental disorder due to known physiological condition: Secondary | ICD-10-CM | POA: Diagnosis not present

## 2022-08-18 DIAGNOSIS — G4733 Obstructive sleep apnea (adult) (pediatric): Secondary | ICD-10-CM

## 2022-08-18 MED ORDER — DONEPEZIL HCL 10 MG PO TABS: 10.0000 mg | ORAL_TABLET | Freq: Every day | ORAL | 3 refills | Status: DC

## 2022-08-18 MED ORDER — MEMANTINE HCL 10 MG PO TABS: 10.0000 mg | ORAL_TABLET | Freq: Two times a day (BID) | ORAL | 3 refills | Status: DC

## 2022-08-22 DIAGNOSIS — R059 Cough, unspecified: Secondary | ICD-10-CM | POA: Diagnosis not present

## 2022-08-22 DIAGNOSIS — J40 Bronchitis, not specified as acute or chronic: Secondary | ICD-10-CM | POA: Diagnosis not present

## 2022-08-22 DIAGNOSIS — R0602 Shortness of breath: Secondary | ICD-10-CM | POA: Diagnosis not present

## 2022-08-22 DIAGNOSIS — R0981 Nasal congestion: Secondary | ICD-10-CM | POA: Diagnosis not present

## 2022-09-01 DIAGNOSIS — M0609 Rheumatoid arthritis without rheumatoid factor, multiple sites: Secondary | ICD-10-CM | POA: Diagnosis not present

## 2022-09-01 DIAGNOSIS — Z79899 Other long term (current) drug therapy: Secondary | ICD-10-CM | POA: Diagnosis not present

## 2022-09-02 ENCOUNTER — Encounter: Payer: Self-pay | Admitting: Psychology

## 2022-09-02 ENCOUNTER — Encounter: Payer: Medicare Other | Attending: Psychology | Admitting: Psychology

## 2022-09-02 DIAGNOSIS — R413 Other amnesia: Secondary | ICD-10-CM

## 2022-09-02 DIAGNOSIS — G4733 Obstructive sleep apnea (adult) (pediatric): Secondary | ICD-10-CM

## 2022-09-02 DIAGNOSIS — F09 Unspecified mental disorder due to known physiological condition: Secondary | ICD-10-CM

## 2022-09-02 NOTE — Progress Notes (Signed)
Neuropsychological Consultation   Patient:   Brett Rosales   DOB:   Dec 22, 1945  MR Number:  [id number]  Location:  [facility] FOR PAIN AND [facility] MEDICINE [facility] PHYSICAL MEDICINE & REHABILITATION [address] [place], [address] [id number] [facility] [place] [address] Dept: [phone]           Date of Service:   09/02/2022  Location of Service and Individuals present: Today's visit was conducted in my outpatient clinic office with the patient, his wife and myself present.  Start Time:   1 PM End Time:   3 PM  1 hour and 15 minutes was spent in face-to-face clinical interview and the other 45 minutes was spent with records review, report writing and setting up testing protocols.  Patient Consent and Confidentiality: Limits of confidentiality specifically around the request for neuropsychological evaluation with formal report being provided to his referring neurology group and made available in the patient's EMR for appropriate medical professionals to review.  Patient and wife consent to limits of confidentiality pertaining to this and consent for evaluation to be conducted.  Consent for Evaluation and Treatment:  Signed:  Yes Explanation of Privacy Policies:  Signed:  Yes Discussion of Confidentiality Limits:  Yes  Provider/Observer:  Arley Phenix, Psy.D.       Clinical Neuropsychologist       Billing Code/Service: [phone]  Chief Complaint:     Chief Complaint  Patient presents with   Memory Loss   Stress   Sleeping Problem    Reason for Service:    Brett Rosales is a 77 year old male referred for a neuropsychological evaluation by Margie Ege, NP with [facility].  Patient was initially seen by Dr. 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  Patient and wife have noticed a gradual onset of memory  loss that began around the time of the start of COVID (2019/2020).  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  Patient's wife felt that he had noted changes in memory and forgetting new information to a greater degree after each surgery.  The patient has had repeat MoCA examinations over this time and have ranged between 23 of 30 and 28 of 30.  His most recent MoCA was 23 of 30.  The patient has not been able to exercise regularly and has a recent weight gain but had lost considerable amount of weight from years prior.  The patient is noted to have very loud snoring and will wake up sometimes in the middle of the night catching his breath.  Patient noted symptoms consistent with an apneic event even while awake recently in the dentist office.  Patient reports that he was diagnosed with obstructive sleep apnea 25 years ago and wore CPAP machine briefly.  Patient has been hesitant around evaluation for obstructive sleep apnea.  A good bit of time was spent in today's visit encouraging him to follow-up with that as it absolutely could be playing at least some degree of a role in his current symptoms.  During the clinical visit today, the patient and wife report that the last 4 years have been really tough.  There were a lot of psychosocial issues associated with her attempts to help 2 brothers who are having increasing medical and cognitive issues.  The  patient describes increasing difficulty remembering where he put things in recalling.  He has had some isolated events of geographic disorientation including missing turns of places that he should have been very familiar with.  The patient reports that he has not really gotten lost during these events but did get turned around went the wrong way for period of time.  The patient notes multiple things that been making life harder than it should be.  The patient  reports that he has a very mild tremor at times in his left hand and has some weakness in his left arm but has had a past left shoulder surgery.  The patient describes very rare visual hallucination at night with no delusions.  The patient denies any word finding issues except when he is very agitated and/or stressed.  He reports that his biggest issue is remembering to do various things that he had planned on doing an misplacing objects.  The patient describes both episodic as well as semantic memory issues for newly learned information.  The patient denies any family history of progressive neurological disorders later in life.  The patient describes his sleep as being "pretty good" but his wife highlights the issue of how bad his snoring is at night but she was unsure of times where he may stop breathing.  Patient's wife does put a lot of effort in making sure that he gets to his appointments and remembers things.  They describe a slow progression with seemingly abrupt changes after various orthopedic surgeries.  Patient denies significant depression or other mood based disturbance and there is been no particular change in behavior or personality styles.  The patient has had some episodes of significant stress creating anxiety and depressive type symptoms.  The patient describes these as primarily psychosocial in nature and there was a very stressful epic of time around COVID where he and his wife are trying to take care of 2 individuals and it became a giant stressor for them.  This is no longer an issue and the patient reports that his mood is better although he does have times where he gets relatively stressed and agitated.  Below you will find issues related to past medical history.  The the patient was diagnosed with obstructive sleep apnea roughly 25 years ago and had a brief stent with CPAP that was discontinued.  The patient has had a more recent sleep study after episode of dysarthria that was  inconclusive.  Patient reports that he simply did not sleep during that study.  I have strongly encouraged the patient to follow-up with that and do everything we can to get a more valid sleep study conducted.  The patient does note that he has had some changes in coordination and weakness.  Patient notes that he tends to drop a lot of things and has been stumbling more than typical.  While his MRI in 2022 only showed age-appropriate atrophy and age-appropriate small vessel changes there was a note of a very localized region in his left frontal brain region that may have reflected a small lacunar infarction in the past.  This was unclear and just hypothesize over some other etiological factors.  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  The patient is taking both Aricept and Namenda without apparent side effects although he is having more vivid dreams that are at least considered to be side effects or potential side effects of Aricept.  The patient took Cymbalta back in 2021 during periods of anxiety and depression with significant psychosocial stressors.  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 it was inconclusive. 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   Snoring 08/13/2021   Other chronic pain 01/01/2021   Restless sleeper 01/01/2021   Atrial flutter (HCC) 01/01/2021   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/29/2020   Other sleep apnea 07/29/2020   Complication of anesthesia    Right inguinal pain 06/03/2020   Anxiety    Arthritis    Depression    History of kidney stones    Hypertension    S/P TKR (total knee replacement) using cement, 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   Bilateral chronic knee pain 08/14/2019   Dyslipidemia 01/23/2019   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   Pain of left hip joint 08/04/2018   Swelling of left lower extremity 05/17/2018   Sleep apnea 03/2018   Ascending aortic aneurysm (HCC) 12/23/2017   Typical atrial flutter (HCC) 12/16/2017   Supraventricular tachycardia 12/09/2017   Essential hypertension 12/09/2017   Dyspnea on exertion 12/09/2017   Rheumatoid arthritis (HCC) 12/09/2017   Dysrhythmia 11/2017   Acute foreign body of ear canal 11/12/2015   Additional Tests and Measures from other records:  Neuroimaging Results: MRI conducted on 09/11/2020 suggested mild age-appropriate atrophy and mild age-appropriate  microvascular ischemic changes.  Behavioral Observation/Mental Status:   Brett Rosales  presents as a 77 y.o.-year-old Right handed (but some ambidextrous motor functions) Caucasian Male who appeared his stated age. his dress was Appropriate and he was Well Groomed and his manners were Appropriate to the situation.  his participation was indicative of Appropriate behaviors.  There were not physical disabilities noted.  he displayed an appropriate level of cooperation and motivation.    Interactions:    Active Appropriate and Attentive  Attention:   within normal limits and attention span and concentration were age appropriate  Memory:   within normal limits; recent and remote memory intact  Visuo-spatial:   not examined  Speech (Volume):  normal  Speech:   normal; normal  Thought Process:  Coherent and Relevant  Coherent, Linear, and Logical  Though Content:  WNL; not suicidal and not homicidal  Orientation:   person, place, time/date, and situation  Judgment:   Good  Planning:   Good  Affect:    Appropriate  Mood:    Euthymic  Insight:   Good  Intelligence:   normal  Marital Status/Living:  Patient was born and raised in [place] as an only child.  Patient did have measles, chickenpox and mumps as a child.  Educational and Occupational History:     Highest Level of Education:  [employer] Achievements: Patient participated in basketball, glee club and was a bus driver in high school.  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  Hobbies and Interests: Patient enjoys building things, sports and  cooking.   Psychiatric History:  Patient had some history of depression and anxiety symptoms during particularly challenging time with psychosocial stressors and took Cymbalta briefly.    Family Med/Psych History:  Family History  Problem Relation Age of Onset   Diabetes Mother    Cancer Mother    Hypertension Father    Diabetes Paternal Grandmother     Impression/DX:   Brett Rosales is a 77 year old male referred for a neuropsychological evaluation by Margie Ege, NP with [facility].  Patient was initially seen by Dr. 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  Patient and wife have noticed a gradual onset of memory loss that began around the time of the start of COVID (2019/2020).  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  Patient's wife felt that he had noted changes in memory and forgetting new information to a greater degree after each surgery.  The patient has had repeat MoCA examinations over this time and have ranged between 23 of 30 and 28 of 30.  His most recent MoCA was 23 of 30.  The patient has not been able to exercise regularly and has a recent weight gain but had lost considerable amount of weight from years prior.  The patient is noted to have very loud snoring and will wake up sometimes in the middle of the night catching his breath.  Patient noted symptoms consistent with an apneic event even while awake recently in the dentist office.  Patient reports that he was diagnosed with obstructive sleep apnea 25 years ago and wore CPAP machine briefly.  Patient has been hesitant around evaluation for obstructive sleep apnea.  A good bit of time was spent in today's visit encouraging him to follow-up with that as it  absolutely could be playing at least some degree of a role in his current symptoms.  Disposition/Plan:  We have set the patient up for formal neuropsychological  assessment and he will complete a foundational battery of the Wechsler Memory Scale and the Wechsler Adult Intelligence Scale as well as the grooved pegboard test and finger tapping test.  Once this foundational battery is completed a formal report will be made and feedback provided to the patient and his wife.  The patient displayed no indications of expressive or receptive language issues and do not think we need to go into in-depth assessment of those.  Diagnosis:    Memory loss  Mild cognitive disorder  Obstructive sleep apnea syndrome        Note: This document was prepared using Dragon voice recognition software and may include unintentional dictation errors.   Electronically Signed   _______________________ Arley Phenix, Psy.D. Clinical Neuropsychologist

## 2022-09-13 ENCOUNTER — Other Ambulatory Visit: Payer: Self-pay | Admitting: Cardiology

## 2022-09-15 ENCOUNTER — Encounter: Payer: Medicare Other | Attending: Psychology

## 2022-09-15 ENCOUNTER — Telehealth: Payer: Self-pay | Admitting: Neurology

## 2022-09-15 DIAGNOSIS — R413 Other amnesia: Secondary | ICD-10-CM | POA: Diagnosis not present

## 2022-09-15 DIAGNOSIS — F09 Unspecified mental disorder due to known physiological condition: Secondary | ICD-10-CM | POA: Diagnosis not present

## 2022-09-15 DIAGNOSIS — G4733 Obstructive sleep apnea (adult) (pediatric): Secondary | ICD-10-CM | POA: Diagnosis not present

## 2022-09-15 NOTE — Telephone Encounter (Signed)
Patient wife left a voicemail on my phone stating th patient is interested in doing the sleep study.

## 2022-09-15 NOTE — Telephone Encounter (Signed)
Called and LVM for patient to call back to complete ESS

## 2022-09-16 NOTE — Progress Notes (Signed)
Behavioral Observations:  The patient appeared well-groomed and appropriately dressed. His manners were polite and appropriate to the situation. The patient's attitude towards testing was positive and his effort was good.   Neuropsychology Note  DAYLE MCNERNEY completed 105 minutes of neuropsychological testing with technician, Staci Acosta,  BA, under the supervision of Arley Phenix, PsyD., Clinical Neuropsychologist. The patient did not appear overtly distressed by the testing session, per behavioral observation or via self-report to the technician. Rest breaks were offered.   Clinical Decision Making: In considering the patient's current level of functioning, level of presumed impairment, nature of symptoms, emotional and behavioral responses during clinical interview, level of literacy, and observed level of motivation/effort, a battery of tests was selected by Dr. Kieth Brightly during initial consultation on 09/02/2022. This was communicated to the technician. Communication between the neuropsychologist and technician was ongoing throughout the testing session and changes were made as deemed necessary based on patient performance on testing, technician observations and additional pertinent factors such as those listed above.  Tests Administered: Finger Tapping Test (FTT) Grooved Pegboard Wechsler Adult Intelligence Scale, 4th Edition (WAIS-IV) Wechsler Memory Scale, 4th Edition (WMS-IV); Older Adult Battery   Results:  FTT:  R (DH) Average= 43.2  Percentile Rank= 45th L (NDH) Average= 52.2  Percentile Rank= 62nd  Grooved Pegboard:  R (DH) time= 103s Percentile Rank= 40th L (NDH) time= 120s  Percentile Rank= 40th  WAIS-IV:  Composite Score Summary  Scale Sum of Scaled Scores Composite Score Percentile Rank 95% Conf. Interval Qualitative Description  Verbal Comprehension 34 VCI 107 68 101-112 Average  Perceptual Reasoning 28 PRI 96 39 90-102 Average  Working Memory 20  WMI 100 50 93-107 Average  Processing Speed 23 PSI 108 70 99-116 Average  Full Scale 105 FSIQ 103 58 99-107 Average  General Ability 62 GAI 101 53 96-106 Average    Verbal Comprehension Subtests Summary  Subtest Raw Score Scaled Score Percentile Rank Reference Group Scaled Score SEM  Similarities 25 11 63 10 0.95  Vocabulary 39 11 63 11 0.67  Information 18 12 75 13 0.73  The scaled scores in the Reference Group Scaled Score column are based on the performance of examinees aged 20:0-34:11 (i.e., the reference group). See Chapter 6 of the WAIS-IV Technical and Interpretive Manual for more information.  Perceptual Reasoning Subtests Summary  Subtest Raw Score Scaled Score Percentile Rank Reference Group Scaled Score SEM  Block Design 32 11 63 7 0.99  Matrix Reasoning 10 9 37 5 0.90  Visual Puzzles 9 8 25 6  0.99   Working Librarian, academic Raw Score Scaled Score Percentile Rank Reference Group Scaled Score SEM  Digit Span 26 11 63 9 0.73  Arithmetic 12 9 37 9 1.20   Processing Speed Subtests Summary  Subtest Raw Score Scaled Score Percentile Rank Reference Group Scaled Score SEM  Symbol Search 31 13 84 9 1.12  Coding 52 10 50 6 1.12    WMS-IV:  Index Score Summary  Index Sum of Scaled Scores Index Score Percentile Rank 95% Confidence Interval Qualitative Descriptor  Auditory Memory (AMI) 36 94 34 88-101 Average  Visual Memory (VMI) 19 98 45 93-103 Average  Immediate Memory (IMI) 28 95 37 89-101 Average  Delayed Memory (DMI) 27 93 32 86-101 Average   Primary Subtest Scaled Score Summary  Subtest Domain Raw Score Scaled Score Percentile Rank  Logical Memory I AM 36 12 75  Logical Memory II AM 12 8 25   Verbal Paired  Associates I AM 13 7 16   Verbal Paired Associates II AM 5 9 37  Visual Reproduction I VM 27 9 37  Visual Reproduction II VM 17 10 50  Symbol Span VWM 14 9 37    Auditory Memory Process Score Summary  Process Score Raw Score Scaled Score  Percentile Rank Cumulative Percentage (Base Rate)  LM II Recognition 20 - - >75%  VPA II Recognition 25 - - 17-25%   Visual Memory Process Score Summary  Process Score Raw Score Scaled Score Percentile Rank Cumulative Percentage (Base Rate)  VR II Recognition 4 - - 26-50%    ABILITY-MEMORY ANALYSIS  Ability Score:  VCI: 107 Date of Testing:  WAIS-IV; WMS-IV 2022/09/15  Predicted Difference Method   Index Predicted WMS-IV Index Score Actual WMS-IV Index Score Difference Critical Value  Significant Difference Y/N Base Rate  Auditory Memory 104 94 10 10.41 N   Visual Memory 103 98 5 7.35 N   Immediate Memory 104 95 9 9.69 N   Delayed Memory 104 93 11 12.22 N   Statistical significance (critical value) at the .01 level.    Feedback to Patient: THORN DEMAS will return on 07/25/2023 for an interactive feedback session with Dr. Kieth Brightly at which time his test performances, clinical impressions and treatment recommendations will be reviewed in detail. The patient understands he can contact our office should he require our assistance before this time.  105 minutes spent face-to-face with patient administering standardized tests, 30 minutes spent scoring Radiographer, therapeutic). [CPT [id number], [id number]]  Full report to follow.

## 2022-09-16 NOTE — Telephone Encounter (Signed)
2nd attempt lvm Needs to complete ess:   Epworth Sleepiness Scale 0= would never doze 1= slight chance of dozing 2= moderate chance of dozing 3= high chance of dozing  Sitting and reading: Watching TV: Sitting inactive in a public place (ex. Theater or meeting): As a passenger in a car for an hour without a break: Lying down to rest in the afternoon: Sitting and talking to someone: Sitting quietly after lunch (no alcohol): In a car, while stopped in traffic: Total:

## 2022-09-20 NOTE — Telephone Encounter (Signed)
Pt called ess done: Epworth Sleepiness Scale 0= would never doze 1= slight chance of dozing 2= moderate chance of dozing 3= high chance of dozing   Sitting and reading:0 Watching TV:1 Sitting inactive in a public place (ex. Theater or meeting):0 As a passenger in a car for an hour without a break:0 Lying down to rest in the afternoon:0 Sitting and talking to someone:0 Sitting quietly after lunch (no alcohol):0 In a car, while stopped in traffic:0 Total:1

## 2022-09-21 NOTE — Addendum Note (Signed)
Addended by: Glean Salvo on: 09/21/2022 08:24 AM   Modules accepted: Orders

## 2022-09-21 NOTE — Telephone Encounter (Signed)
1ST ATTEMPT LVM

## 2022-09-21 NOTE — Telephone Encounter (Signed)
Order placed for HST and PSG, depending on insurance.  Ordered for the reason of: Snoring, apnea spells, memory loss, vivid dreams.  ESS was only 1, but not clear patient understood scale.  Saw Dr. Vickey Rosales in October 2022, attended sleep study was ordered, plan be HST.  Orders Placed This Encounter  Procedures   Home sleep test   PSG SLEEP STUDY

## 2022-09-22 NOTE — Telephone Encounter (Signed)
Called and spoke to pt and relayed msg that he will be getting sleep study done and he voiced gratitude and understanding

## 2022-10-04 DIAGNOSIS — G4733 Obstructive sleep apnea (adult) (pediatric): Secondary | ICD-10-CM | POA: Diagnosis not present

## 2022-10-04 DIAGNOSIS — F988 Other specified behavioral and emotional disorders with onset usually occurring in childhood and adolescence: Secondary | ICD-10-CM | POA: Diagnosis not present

## 2022-10-04 DIAGNOSIS — J209 Acute bronchitis, unspecified: Secondary | ICD-10-CM | POA: Diagnosis not present

## 2022-10-04 DIAGNOSIS — I10 Essential (primary) hypertension: Secondary | ICD-10-CM | POA: Diagnosis not present

## 2022-10-13 ENCOUNTER — Encounter: Payer: Medicare Other | Admitting: Psychology

## 2022-10-13 DIAGNOSIS — M0609 Rheumatoid arthritis without rheumatoid factor, multiple sites: Secondary | ICD-10-CM | POA: Diagnosis not present

## 2022-10-18 DIAGNOSIS — F988 Other specified behavioral and emotional disorders with onset usually occurring in childhood and adolescence: Secondary | ICD-10-CM | POA: Diagnosis not present

## 2022-10-18 DIAGNOSIS — I10 Essential (primary) hypertension: Secondary | ICD-10-CM | POA: Diagnosis not present

## 2022-10-18 DIAGNOSIS — G4733 Obstructive sleep apnea (adult) (pediatric): Secondary | ICD-10-CM | POA: Diagnosis not present

## 2022-10-18 DIAGNOSIS — E669 Obesity, unspecified: Secondary | ICD-10-CM | POA: Diagnosis not present

## 2022-10-21 DIAGNOSIS — H18413 Arcus senilis, bilateral: Secondary | ICD-10-CM | POA: Diagnosis not present

## 2022-10-21 DIAGNOSIS — M79642 Pain in left hand: Secondary | ICD-10-CM | POA: Diagnosis not present

## 2022-10-21 DIAGNOSIS — H02831 Dermatochalasis of right upper eyelid: Secondary | ICD-10-CM | POA: Diagnosis not present

## 2022-10-21 DIAGNOSIS — E669 Obesity, unspecified: Secondary | ICD-10-CM | POA: Diagnosis not present

## 2022-10-21 DIAGNOSIS — M0609 Rheumatoid arthritis without rheumatoid factor, multiple sites: Secondary | ICD-10-CM | POA: Diagnosis not present

## 2022-10-21 DIAGNOSIS — H26493 Other secondary cataract, bilateral: Secondary | ICD-10-CM | POA: Diagnosis not present

## 2022-10-21 DIAGNOSIS — Z961 Presence of intraocular lens: Secondary | ICD-10-CM | POA: Diagnosis not present

## 2022-10-21 DIAGNOSIS — M1991 Primary osteoarthritis, unspecified site: Secondary | ICD-10-CM | POA: Diagnosis not present

## 2022-10-21 DIAGNOSIS — M79641 Pain in right hand: Secondary | ICD-10-CM | POA: Diagnosis not present

## 2022-10-21 DIAGNOSIS — Z79899 Other long term (current) drug therapy: Secondary | ICD-10-CM | POA: Diagnosis not present

## 2022-10-21 DIAGNOSIS — Z6835 Body mass index (BMI) 35.0-35.9, adult: Secondary | ICD-10-CM | POA: Diagnosis not present

## 2022-10-27 DIAGNOSIS — H43392 Other vitreous opacities, left eye: Secondary | ICD-10-CM | POA: Diagnosis not present

## 2022-10-27 DIAGNOSIS — H4322 Crystalline deposits in vitreous body, left eye: Secondary | ICD-10-CM | POA: Diagnosis not present

## 2022-10-27 DIAGNOSIS — H26492 Other secondary cataract, left eye: Secondary | ICD-10-CM | POA: Diagnosis not present

## 2022-11-04 ENCOUNTER — Ambulatory Visit: Payer: Medicare Other | Admitting: Cardiology

## 2022-11-04 ENCOUNTER — Encounter: Payer: Self-pay | Admitting: Cardiology

## 2022-11-04 ENCOUNTER — Ambulatory Visit: Payer: Medicare Other | Attending: Cardiology | Admitting: Cardiology

## 2022-11-04 VITALS — BP 150/92 | HR 84 | Ht 74.0 in | Wt 280.2 lb

## 2022-11-04 DIAGNOSIS — I483 Typical atrial flutter: Secondary | ICD-10-CM | POA: Diagnosis not present

## 2022-11-04 DIAGNOSIS — I471 Supraventricular tachycardia, unspecified: Secondary | ICD-10-CM | POA: Diagnosis not present

## 2022-11-04 DIAGNOSIS — I10 Essential (primary) hypertension: Secondary | ICD-10-CM | POA: Insufficient documentation

## 2022-11-04 DIAGNOSIS — E785 Hyperlipidemia, unspecified: Secondary | ICD-10-CM

## 2022-11-04 NOTE — Patient Instructions (Signed)
Medication Instructions:  Your physician recommends that you continue on your current medications as directed. Please refer to the Current Medication list given to you today.  *If you need a refill on your cardiac medications before your next appointment, please call your pharmacy*   Lab Work: Your physician recommends that you return for lab work in: when lab open You need to have labs done when you are fasting.  You can come Monday through Friday 8:30 am to 12:00 pm and 1:15 to 4:30. You do not need to make an appointment as the order has already been placed. The labs you are going to have done are BMET.    Testing/Procedures: None Ordered   Follow-Up: At [facility], you and your health needs are our priority.  As part of our continuing mission to provide you with exceptional heart care, we have created designated Provider Care Teams.  These Care Teams include your primary Cardiologist (physician) and Advanced Practice Providers (APPs -  Physician Assistants and Nurse Practitioners) who all work together to provide you with the care you need, when you need it.  We recommend signing up for the patient portal called "MyChart".  Sign up information is provided on this After Visit Summary.  MyChart is used to connect with patients for Virtual Visits (Telemedicine).  Patients are able to view lab/test results, encounter notes, upcoming appointments, etc.  Non-urgent messages can be sent to your provider as well.   To learn more about what you can do with MyChart, go to [email or web address].    Your next appointment:   3 month(s)  The format for your next appointment:   In Person  Provider:   Gypsy Balsam, MD    Other Instructions NA

## 2022-11-05 DIAGNOSIS — I10 Essential (primary) hypertension: Secondary | ICD-10-CM | POA: Diagnosis not present

## 2022-11-05 NOTE — Progress Notes (Signed)
Cardiology Office Note:    Date:  11/05/2022   ID:  Brett Rosales, DOB 1945/04/02, MRN [id number]  PCP:  Julianne Handler, NP  Cardiologist:  Gypsy Balsam, MD    Referring MD: Julianne Handler, NP   Chief Complaint  Patient presents with   Cough    When laying    Medication Management    Amlodipine was added due to elevated BP Want to d/c blood thinner    History of Present Illness:    Brett Rosales is a 77 y.o. male with past medical history significant for typical paroxysmal atrial flutter he is anticoagulated, enlargement of the ascending aorta measuring 43 mm, dyslipidemia, dyspnea on exertion.  Comes today to months for follow-up overall doing well.  Denies have any chest pain tightness squeezing pressure burning chest.  Look like he maintained sinus rhythm  Past Medical History:  Diagnosis Date   Acute foreign body of ear canal 11/12/2015   Anxiety    medication made him feel flat stopped meds 09/07/2018   Arthritis    Ascending aortic aneurysm (HCC) 12/23/2017   Bilateral chronic knee pain 08/14/2019   BMI 39.0-39.9,adult 06/03/2020   Complication of anesthesia    Knee block didn't take in 7/21 lt knee   Depression    Dyslipidemia 01/23/2019   Dyspnea on exertion 12/09/2017   Dysrhythmia 11/2017   a flutter   Essential hypertension 12/09/2017   History of kidney stones    13 stones   Hypertension    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   Other chronic pain 01/01/2021   Pain of left hip joint 08/04/2018   Restless sleeper 01/01/2021   Rheumatoid arthritis (HCC) 12/09/2017   Right inguinal pain 06/03/2020   S/P TKR (total knee replacement) using cement, 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 it was inconclusive. needs follow up   Snoring 08/13/2021   Supraventricular tachycardia 12/09/2017   Swelling of left lower extremity 05/17/2018   Typical atrial  flutter (HCC) 12/16/2017    Past Surgical History:  Procedure Laterality Date   TONSILLECTOMY AND ADENOIDECTOMY     TOTAL HIP ARTHROPLASTY Left 09/21/2018   Procedure: TOTAL HIP ARTHROPLASTY ANTERIOR APPROACH;  Surgeon: Samson Frederic, MD;  Location: [facility];  Service: Orthopedics;  Laterality: Left;   TOTAL KNEE ARTHROPLASTY Left 09/24/2019   Procedure: TOTAL KNEE ARTHROPLASTY;  Surgeon: Dannielle Huh, MD;  Location: [facility];  Service: Orthopedics;  Laterality: Left;   TOTAL KNEE ARTHROPLASTY Right 02/04/2020   Procedure: TOTAL KNEE ARTHROPLASTY;  Surgeon: Dannielle Huh, MD;  Location: [facility];  Service: Orthopedics;  Laterality: Right;   VASECTOMY  1990    Current Medications: Current Meds  Medication Sig   amLODipine (NORVASC) 5 MG tablet Take 5 mg by mouth daily.   B Complex Vitamins (VITAMIN B COMPLEX) TABS Take 1 tablet by mouth daily.   Cholecalciferol (VITAMIN D3 PO) Take 2,000 Units by mouth daily.   Docusate Sodium (DSS) 100 MG CAPS Take 100 mg by mouth in the morning and at bedtime.   donepezil (ARICEPT) 10 MG tablet Take 1 tablet (10 mg total) by mouth daily.   DULoxetine (CYMBALTA) 30 MG capsule Take 30 mg by mouth daily.   folic acid (FOLVITE) 1 MG tablet Take 1 mg by mouth daily.   hydroxychloroquine (PLAQUENIL) 200 MG tablet Take 200 mg by mouth 2 (two) times daily.  inFLIXimab (REMICADE IV) Inject 1 vial into the vein See admin instructions. Every 6 weeks/ Unknown strenght   losartan (COZAAR) 100 MG tablet Take 100 mg by mouth daily.   memantine (NAMENDA) 10 MG tablet Take 1 tablet (10 mg total) by mouth 2 (two) times daily.   methotrexate 50 MG/2ML injection Inject 25 mg into the skin once a week.    methylphenidate (RITALIN) 20 MG tablet Take 20 mg by mouth daily.   metoprolol tartrate (LOPRESSOR) 25 MG tablet Take 1 tablet (25 mg total) by mouth 2 (two) times daily.   NON FORMULARY Place 1 drop into both eyes as needed (eye sight). For eye sight   pravastatin  (PRAVACHOL) 40 MG tablet Take 1 tablet (40 mg total) by mouth every evening.   Red Yeast Rice Extract (RED YEAST RICE PO) Take 2 capsules by mouth daily.   rivaroxaban (XARELTO) 20 MG TABS tablet TAKE 1 TABLET BY MOUTH DAILY WITH SUPPER (Patient taking differently: Take 20 mg by mouth daily with supper.)     Allergies:   Nsaids, Ibuprofen, and Statins   Social History   Socioeconomic History   Marital status: Married    Spouse name: Brett Rosales   Number of children: 3   Years of education: Not on file   Highest education level: High school graduate  Occupational History   Not on file  Tobacco Use   Smoking status: Former    Current packs/day: 0.00    Average packs/day: 1 pack/day for 15.0 years (15.0 ttl pk-yrs)    Types: Cigarettes    Start date: 01/30/1971    Quit date: 01/29/1986    Years since quitting: 36.7   Smokeless tobacco: Never  Vaping Use   Vaping status: Never Used  Substance and Sexual Activity   Alcohol use: No    Alcohol/week: 0.0 standard drinks of alcohol   Drug use: No   Sexual activity: Yes    Birth control/protection: None  Other Topics Concern   Not on file  Social History Narrative   Lives with wife   Right handed   Caffeine: occa drinks coffee, no energy drink. Ginger tea   Social Determinants of Corporate investment banker Strain: Not on file  Food Insecurity: Not on file  Transportation Needs: Not on file  Physical Activity: Not on file  Stress: Not on file  Social Connections: Not on file     Family History: The patient's family history includes Cancer in his mother; Diabetes in his mother and paternal grandmother; Hypertension in his father. ROS:   Please see the history of present illness.    All 14 point review of systems negative except as described per history of present illness  EKGs/Labs/Other Studies Reviewed:         Recent Labs: No results found for requested labs within last 365 days.  Recent Lipid Panel    Component  Value Date/Time   CHOL 248 (H) 01/12/2022 1638   TRIG 67 01/12/2022 1638   HDL 116 01/12/2022 1638   CHOLHDL 2.1 01/12/2022 1638   LDLCALC 121 (H) 01/12/2022 1638    Physical Exam:    VS:  BP (!) 150/92 (BP Location: Right Arm, Patient Position: Sitting)   Pulse 84   Ht 6\' 2"  (1.88 m)   Wt 280 lb 3.2 oz (127.1 kg)   SpO2 93%   BMI 35.98 kg/m     Wt Readings from Last 3 Encounters:  11/04/22 280 lb 3.2 oz (127.1 kg)  08/18/22 269 lb 8 oz (122.2 kg)  02/16/22 257 lb (116.6 kg)     GEN:  Well nourished, well developed in no acute distress HEENT: Normal NECK: No JVD; No carotid bruits LYMPHATICS: No lymphadenopathy CARDIAC: RRR, no murmurs, no rubs, no gallops RESPIRATORY:  Clear to auscultation without rales, wheezing or rhonchi  ABDOMEN: Soft, non-tender, non-distended MUSCULOSKELETAL:  No edema; No deformity  SKIN: Warm and dry LOWER EXTREMITIES: no swelling NEUROLOGIC:  Alert and oriented x 3 PSYCHIATRIC:  Normal affect   ASSESSMENT:    1. Supraventricular tachycardia   2. Essential hypertension   3. Typical atrial flutter (HCC)   4. Dyslipidemia    PLAN:    In order of problems listed above:  Supraventricular tachycardia denies have any palpitations. Atypical atrial flutter denies having any palpitations. Dyslipidemia cholesterol medication which I will continue. Ascending aortic aneurysm, the key is to control his blood pressure.  Will check his Chem-7 if Chem-7 is fine we will add some diuretic   Medication Adjustments/Labs and Tests Ordered: Current medicines are reviewed at length with the patient today.  Concerns regarding medicines are outlined above.  Orders Placed This Encounter  Procedures   Basic metabolic panel   EKG 12-Lead   Medication changes: No orders of the defined types were placed in this encounter.   Signed, Georgeanna Lea, MD, [facility] 11/05/2022 10:09 AM     [facility]

## 2022-11-06 LAB — BASIC METABOLIC PANEL
BUN/Creatinine Ratio: 17 (ref 10–24)
BUN: 19 mg/dL (ref 8–27)
CO2: 24 mmol/L (ref 20–29)
Calcium: 9.2 mg/dL (ref 8.6–10.2)
Chloride: 102 mmol/L (ref 96–106)
Creatinine, Ser: 1.13 mg/dL (ref 0.76–1.27)
Glucose: 79 mg/dL (ref 70–99)
Potassium: 4.8 mmol/L (ref 3.5–5.2)
Sodium: 140 mmol/L (ref 134–144)
eGFR: 68 mL/min/{1.73_m2} (ref >59)

## 2022-11-09 ENCOUNTER — Ambulatory Visit (INDEPENDENT_AMBULATORY_CARE_PROVIDER_SITE_OTHER): Payer: Medicare Other | Admitting: Neurology

## 2022-11-09 DIAGNOSIS — R0609 Other forms of dyspnea: Secondary | ICD-10-CM

## 2022-11-09 DIAGNOSIS — I483 Typical atrial flutter: Secondary | ICD-10-CM

## 2022-11-09 DIAGNOSIS — G4733 Obstructive sleep apnea (adult) (pediatric): Secondary | ICD-10-CM

## 2022-11-09 DIAGNOSIS — G4734 Idiopathic sleep related nonobstructive alveolar hypoventilation: Secondary | ICD-10-CM

## 2022-11-10 ENCOUNTER — Telehealth: Payer: Self-pay

## 2022-11-10 NOTE — Progress Notes (Signed)
[facility]  Brett Rosales 77 year old male Jul 11, 1945   HOME SLEEP TEST REPORT ( by Watch PAT)   STUDY DATE:  11-11-2022    ORDERING CLINICIAN: Loni Dolly  Primary neurologist: Dr Terrace Arabia.   REFERRING CLINICIAN: Loni Dolly  Primary neurologist: Dr Terrace Arabia.    CLINICAL INFORMATION/HISTORY: atrial fib/ supraventricular tachycardia patient with rheumatoid arthritis,  nasal congestion, OSA on CPAP in the past. 07/29/2020 Dr. Terrace Arabia: Brett Rosales, is a 77 year old male, seen in request by his primary care physician Dr. Leonor Liv, Mendel Corning for evaluation of memory loss, excessive fatigue, he is accompanied by his wife at today's visit on Jul 30, 2020.He was noted to have gradual onset of memory loss since his multiple joint replacement surgery in 2020, he had left hip replacement in 2020, left knee replacement in summer 2021, and right knee replacement in November 2020.There was no family history of dementia, he was put on Aricept 5 mg few months ago, while he was taking every night, he complains of excessive dreams, improved after move the dosage to every morning, he is independent in his daily activity, MoCA examination is 23/30 today.    Epworth sleepiness score: 3 /24. FSS : 45/63   GDS: 3/ 15 ( 2022)  BMI:  34.5 kg/m  Neck Circumference: 18.25 " ( 2022)    FINDINGS:   Sleep Summary:   Total Recording Time (hours, min): 8 hours 40 minutes      Total Sleep Time (hours, min):   5 hours 39 minutes              Percent REM (%): 14.7%                                       Respiratory Indices AASM:   Calculated pAHI (per hour):   37.2/h by AASM criteria.  17/h by CMS criteria                          REM pAHI: 38.7/h                                               NREM pAHI: 36.9/h                            Positional AHI: Neither snoring data no positional data are available as the chest wall electrode did not record.                                                  Oxygen Saturation Statistics:     O2 Saturation Range (%): Nadir 78% maximum saturation 94% and mean saturation 87%.                                      O2 Saturation (minutes) <89%: 195 minutes over 57% of total sleep time in hypoxia. This would be a  severe hypoxemia.   Pulse Rate Statistics:   Pulse Mean (bpm):   62 bpm              Pulse Range: Between 44 and 82 bpm               IMPRESSION:  This HST confirms the presence of severe sleep apnea ( OSA ) which was not REM sleep accentuated. There was also severest hypoxia, for more than half of the total sleep time.  Sleep quality was poor, with 135 minutes of wakefulness after sleep onset.   It is the hypoxemia that is most concerning. I have no explanation for this , could this be an artefact?   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  His BMI has been reduced from 39 to 34 , and he is a former smoker but quit in 1987.    RECOMMENDATION: I like for this patient to have an in lab titration to PAP -and to oxygen if needed. I plan for this patient to be offered a sleep aid in order to obtain enough sleep time and make a valid PSG study possible. Prescription was written and patient will be needing to bring this medication to the sleep lab (!!)  I would like to also encourage him to bring any pain medication for nighttime medication with him and to take it in the sleep lab.    INTERPRETING PHYSICIAN: Melvyn Novas, MD   Cc: Dr Ulyses Southward

## 2022-11-10 NOTE — Telephone Encounter (Signed)
-----   Message from Gypsy Balsam sent at 11/10/2022  9:39 AM EDT ----- Chem-7 looks good, please add hydrochlorothiazide 25 mg daily, Chem-7 1 week

## 2022-11-10 NOTE — Telephone Encounter (Signed)
LM to return my call. 

## 2022-11-16 ENCOUNTER — Telehealth: Payer: Self-pay

## 2022-11-16 DIAGNOSIS — I10 Essential (primary) hypertension: Secondary | ICD-10-CM

## 2022-11-16 DIAGNOSIS — G4733 Obstructive sleep apnea (adult) (pediatric): Secondary | ICD-10-CM | POA: Insufficient documentation

## 2022-11-16 DIAGNOSIS — Z79899 Other long term (current) drug therapy: Secondary | ICD-10-CM

## 2022-11-16 HISTORY — DX: Obstructive sleep apnea (adult) (pediatric): G47.33

## 2022-11-16 MED ORDER — HYDROCHLOROTHIAZIDE 25 MG PO TABS
25.0000 mg | ORAL_TABLET | Freq: Every day | ORAL | 1 refills | Status: DC
Start: 2022-11-16 — End: 2023-04-19

## 2022-11-16 MED ORDER — ALPRAZOLAM 0.5 MG PO TABS: 0.5000 mg | ORAL_TABLET | Freq: Every evening | ORAL | 0 refills | Status: DC | PRN

## 2022-11-16 NOTE — Telephone Encounter (Signed)
Patient notified of results and recommendations and agreed with plan, medication sent, lab order on file.   

## 2022-11-16 NOTE — Procedures (Signed)
[facility]  Brett Rosales 77 year old male August 31, 1945   HOME SLEEP TEST REPORT ( by Watch PAT)   STUDY DATE:  11-11-2022    ORDERING CLINICIAN: Loni Dolly  Primary neurologist: Dr Terrace Arabia.   REFERRING CLINICIAN: Loni Dolly  Primary neurologist: Dr Terrace Arabia.    CLINICAL INFORMATION/HISTORY: atrial fib/ supraventricular tachycardia patient with rheumatoid arthritis,  nasal congestion, OSA on CPAP in the past. 07/29/2020 Dr. Terrace Arabia: Brett Rosales, is a 77 year old male, seen in request by his primary care physician Dr. Leonor Liv, Mendel Corning for evaluation of memory loss, excessive fatigue, he is accompanied by his wife at today's visit on Jul 30, 2020.He was noted to have gradual onset of memory loss since his multiple joint replacement surgery in 2020, he had left hip replacement in 2020, left knee replacement in summer 2021, and right knee replacement in November 2020.There was no family history of dementia, he was put on Aricept 5 mg few months ago, while he was taking every night, he complains of excessive dreams, improved after move the dosage to every morning, he is independent in his daily activity, MoCA examination is 23/30 today.    Epworth sleepiness score: 3 /24. FSS : 45/63   GDS: 3/ 15 ( 2022)  BMI:  34.5 kg/m  Neck Circumference: 18.25 " ( 2022)    FINDINGS:   Sleep Summary:   Total Recording Time (hours, min): 8 hours 40 minutes      Total Sleep Time (hours, min):   5 hours 39 minutes              Percent REM (%): 14.7%                                       Respiratory Indices AASM:   Calculated pAHI (per hour):   37.2/h by AASM criteria.  17/h by CMS criteria                          REM pAHI: 38.7/h                                               NREM pAHI: 36.9/h                            Positional AHI: Neither snoring data no positional data are available as the chest wall electrode did not record.                                                 Oxygen  Saturation Statistics:     O2 Saturation Range (%): Nadir 78% maximum saturation 94% and mean saturation 87%.                                      O2 Saturation (minutes) <89%: 195 minutes over 57% of total sleep time in hypoxia. This would be a severe hypoxemia.   Pulse Rate Statistics:   Pulse  Mean (bpm):   62 bpm              Pulse Range: Between 44 and 82 bpm               IMPRESSION:  This HST confirms the presence of severe sleep apnea ( OSA ) which was not REM sleep accentuated. There was also severest hypoxia, for more than half of the total sleep time.  Sleep quality was poor, with 135 minutes of wakefulness after sleep onset.   It is the hypoxemia that is most concerning. I have no explanation for this , could this be an artefact?   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  His BMI has been reduced from 39 to 34 , and he is a former smoker but quit in 1987.    RECOMMENDATION: I like for this patient to have an in lab titration to PAP -and to oxygen if needed. I plan for this patient to be offered a sleep aid in order to obtain enough sleep time and make a valid PSG study possible. Prescription was written and patient will be needing to bring this medication to the sleep lab (!!)  I would like to also encourage him to bring any pain medication for nighttime medication with him and to take it in the sleep lab.    INTERPRETING PHYSICIAN: Melvyn Novas, MD   Cc: Dr Ulyses Southward

## 2022-11-16 NOTE — Telephone Encounter (Signed)
-----   Message from Gypsy Balsam sent at 11/10/2022  9:39 AM EDT ----- Chem-7 looks good, please add hydrochlorothiazide 25 mg daily, Chem-7 1 week

## 2022-11-17 ENCOUNTER — Telehealth: Payer: Self-pay | Admitting: Neurology

## 2022-11-17 NOTE — Telephone Encounter (Signed)
CPAP- Medicare/BCBS supp no auth req per Dr. Vickey Huger pt is aware to bring alprazoam for sleep aid & the wife may stay due to cognitive impairment   Spoke with patient wife he is scheduled at [facility] for 12/06/22 at 8 pm.  Mailed packet to the patient.

## 2022-11-17 NOTE — Telephone Encounter (Signed)
Had HST 11/11/2022, confirmed severe sleep apnea along with severe hypoxia.  Dr. Vickey Huger has ordered an in lab CPAP titration and oxygen if needed.  He should watch for call from sleep lab to schedule.  IMPRESSION:  This HST confirms the presence of severe sleep apnea ( OSA ) which was not REM sleep accentuated. There was also severest hypoxia, for more than half of the total sleep time.  Sleep quality was poor, with 135 minutes of wakefulness after sleep onset.   It is the hypoxemia that is most concerning. I have no explanation for this , could this be an artefact?    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  His BMI has been reduced from 39 to 34 , and he is a former smoker but quit in 1987.    RECOMMENDATION: I like for this patient to have an in lab titration to PAP -and to oxygen if needed. I plan for this patient to be offered a sleep aid in order to obtain enough sleep time and make a valid PSG study possible. Prescription was written and patient will be needing to bring this medication to the sleep lab (!!)  I would like to also encourage him to bring any pain medication for nighttime medication with him and to take it in the sleep lab.

## 2022-11-17 NOTE — Telephone Encounter (Signed)
I called pt. I advised pt that Dr. Vickey Huger reviewed their sleep study results and found that has severe sleep apnea and hypoxemia and recommends that pt be treated with a cpap. Dr. Vickey Huger recommends that pt return for a repeat sleep study in order to properly titrate the cpap and ensure a good mask fit. Pt is agreeable to returning for a titration study. I advised pt that our sleep lab will file with pt's insurance and call pt to schedule the sleep study when we hear back from the pt's insurance regarding coverage of this sleep study. Pt verbalized understanding of results. Pt had no questions at this time but was encouraged to call back if questions arise.

## 2022-11-24 DIAGNOSIS — M0609 Rheumatoid arthritis without rheumatoid factor, multiple sites: Secondary | ICD-10-CM | POA: Diagnosis not present

## 2022-11-24 DIAGNOSIS — Z79899 Other long term (current) drug therapy: Secondary | ICD-10-CM | POA: Diagnosis not present

## 2022-12-01 DIAGNOSIS — H43312 Vitreous membranes and strands, left eye: Secondary | ICD-10-CM | POA: Diagnosis not present

## 2022-12-01 DIAGNOSIS — H43392 Other vitreous opacities, left eye: Secondary | ICD-10-CM | POA: Diagnosis not present

## 2022-12-02 DIAGNOSIS — M654 Radial styloid tenosynovitis [de Quervain]: Secondary | ICD-10-CM

## 2022-12-02 DIAGNOSIS — M25532 Pain in left wrist: Secondary | ICD-10-CM | POA: Insufficient documentation

## 2022-12-02 HISTORY — DX: Pain in left wrist: M25.532

## 2022-12-02 HISTORY — DX: Radial styloid tenosynovitis (de quervain): M65.4

## 2022-12-06 ENCOUNTER — Ambulatory Visit (INDEPENDENT_AMBULATORY_CARE_PROVIDER_SITE_OTHER): Payer: Medicare Other | Admitting: Neurology

## 2022-12-06 DIAGNOSIS — G4734 Idiopathic sleep related nonobstructive alveolar hypoventilation: Secondary | ICD-10-CM

## 2022-12-06 DIAGNOSIS — G4730 Sleep apnea, unspecified: Secondary | ICD-10-CM

## 2022-12-06 DIAGNOSIS — G4733 Obstructive sleep apnea (adult) (pediatric): Secondary | ICD-10-CM

## 2022-12-06 DIAGNOSIS — F09 Unspecified mental disorder due to known physiological condition: Secondary | ICD-10-CM

## 2022-12-09 DIAGNOSIS — H4322 Crystalline deposits in vitreous body, left eye: Secondary | ICD-10-CM | POA: Diagnosis not present

## 2022-12-09 DIAGNOSIS — H43393 Other vitreous opacities, bilateral: Secondary | ICD-10-CM | POA: Diagnosis not present

## 2022-12-09 DIAGNOSIS — H26492 Other secondary cataract, left eye: Secondary | ICD-10-CM | POA: Diagnosis not present

## 2022-12-09 DIAGNOSIS — H26491 Other secondary cataract, right eye: Secondary | ICD-10-CM | POA: Diagnosis not present

## 2022-12-14 ENCOUNTER — Encounter: Payer: Medicare Other | Attending: Psychology | Admitting: Psychology

## 2022-12-14 DIAGNOSIS — G4734 Idiopathic sleep related nonobstructive alveolar hypoventilation: Secondary | ICD-10-CM | POA: Insufficient documentation

## 2022-12-14 DIAGNOSIS — F09 Unspecified mental disorder due to known physiological condition: Secondary | ICD-10-CM | POA: Diagnosis not present

## 2022-12-14 DIAGNOSIS — R413 Other amnesia: Secondary | ICD-10-CM | POA: Diagnosis not present

## 2022-12-14 DIAGNOSIS — G4733 Obstructive sleep apnea (adult) (pediatric): Secondary | ICD-10-CM | POA: Diagnosis not present

## 2022-12-14 NOTE — Addendum Note (Signed)
Addended by: Melvyn Novas on: 12/14/2022 02:11 PM   Modules accepted: Orders

## 2022-12-14 NOTE — Progress Notes (Signed)
Neuropsychological Evaluation   Patient:  Brett Rosales   DOB: 01-12-1946  MR Number: [id number]  Location: [facility] [address] [place], [address] [place] [address] Dept: [phone]  Start: 8 AM End: 9 AM  Provider/Observer:     Hershal Coria PsyD  Chief Complaint:      Chief Complaint  Patient presents with   Memory Loss   Sleeping Problem   Stress    Reason For Service:     Brett Rosales is a 77 year old male referred for a neuropsychological evaluation by Margie Ege, NP with [facility].  Patient was initially seen by Dr. 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  Patient and wife have noticed a gradual onset of memory loss that began around the time of the start of COVID (2019/2020).  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  Patient's wife felt that she had noted changes in memory and forgetting new information to a greater degree after each surgery.  The patient has had repeat MoCA examinations over this time and have ranged between 23/30 and 28/30.  His most recent MoCA was 23/30.  The patient has not been able to exercise regularly and has a recent weight gain but had lost considerable amount of weight from years prior.  The patient is noted to have very loud snoring and will wake up sometimes in the middle of the night catching his breath.  Patient noted symptoms consistent with an apneic event even while awake recently in the dentist office.  Patient reports that he was diagnosed with obstructive sleep apnea 25 years ago and wore CPAP machine briefly.  Patient has been hesitant around evaluation for obstructive  sleep apnea.  A good bit of time was spent in today's visit encouraging him to follow-up with that as it absolutely could be playing at least some degree of a role in his current symptoms.  Note:  Patient had new sleep study performed by Dr. Vickey Huger and interpreted on 11/09/2022 with identification of severe obstructive sleep apnea and most concerning hypoxemia although there was some concern noted about hypoxemia potentially being artifact.  The patient is returning for titration of CPAP.   During the clinical visit in my office (09/02/2022), the patient and wife report that the last 4 years have been really tough.  There were a lot of psychosocial issues associated with her attempts to help 2 brothers who are having increasing medical and cognitive issues.  The patient describes increasing difficulty remembering where he put things and recalling any new information.  He has had some isolated events of geographic disorientation including missing turns of places that he should have been very familiar with.  The patient reports that he has not really gotten lost during these events but did get turned around and went the wrong way for period of time.  The patient notes multiple things that been making life harder than it should be.  The patient reports that he has a very mild tremor at times in his left hand and has some weakness in his left arm but has had a past left shoulder surgery.  The patient describes very rare visual hallucination at night with no delusions.  The  patient denies any word finding issues except when he is very agitated and/or stressed.  He reports that his biggest issue is remembering to do various things that he had planned on doing and misplacing objects.  The patient describes both episodic as well as semantic memory issues for newly learned/experienced information.   The patient denies any family history of progressive neurological disorders later in life.   The patient describes his  sleep as being "pretty good" but his wife highlights the issue of how bad his snoring is at night but she was unsure of times where he may stop breathing.  Patient's wife does put a lot of effort in making sure that he gets to his appointments and remembers things.  They describe a slow progression with seemingly abrupt changes after various orthopedic surgeries.  Patient denies significant depression or other mood based disturbance and there is been no particular change in behavior or personality styles.  The patient has had some episodes of significant stress creating anxiety and depressive type symptoms.  The patient describes these as primarily psychosocial in nature and there was a very stressful epic of time around COVID where he and his wife are trying to take care of 2 individuals and it became a giant stressor for them.  This is no longer an issue and the patient reports that his mood is better, although he does have times where he gets relatively stressed and agitated.  Below you will find issues related to past medical history.   The the patient was diagnosed with obstructive sleep apnea roughly 25 years ago and had a brief stent with CPAP that was discontinued.  The patient has had a more recent sleep study after episode of dysarthria that was inconclusive.  Patient reports that he simply did not sleep during that study.  I have strongly encouraged the patient to follow-up with that and do everything we can to get a more valid sleep study conducted.   The patient does note that he has had some changes in coordination and weakness.  Patient notes that he tends to drop a lot of things and has been stumbling more than typical.  While his MRI in 2022 only showed age-appropriate atrophy and age-appropriate small vessel changes there was a note of a very localized region in his left frontal brain region that may have reflected a small lacunar infarction in the past.  This was unclear and just hypothesize  over some other etiological factors.   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   The patient is taking both Aricept and Namenda without apparent side effects, although he is having more vivid dreams that are at least considered to be side effects or potential side effects of Aricept.  The patient took Cymbalta back in 2021 during periods of anxiety and depression with significant psychosocial stressors.   Medical History:                         Past Medical History:  Diagnosis Date   Acute foreign body of ear canal 11/12/2015   Anxiety      medication made him feel flat stopped meds 09/07/2018   Arthritis  Ascending aortic aneurysm (HCC) 12/23/2017   Bilateral chronic knee pain 08/14/2019   BMI 39.0-39.9,adult 06/03/2020   Complication of anesthesia      Knee block didn't take in 7/21 lt knee   Depression     Dyslipidemia 01/23/2019   Dyspnea on exertion 12/09/2017   Dysrhythmia 11/2017    a flutter   Essential hypertension 12/09/2017   History of kidney stones      13 stones   Hypertension     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 it was inconclusive. 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   Snoring 08/13/2021   Other chronic pain 01/01/2021   Restless sleeper 01/01/2021   Atrial flutter (HCC) 01/01/2021   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/29/2020   Other sleep apnea 07/29/2020   Complication of anesthesia     Right inguinal pain 06/03/2020   Anxiety     Arthritis     Depression     History of kidney stones     Hypertension     S/P TKR (total knee replacement) using cement, 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   Bilateral chronic knee pain 08/14/2019   Dyslipidemia 01/23/2019   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   Pain of left hip joint 08/04/2018   Swelling of left lower extremity 05/17/2018   Sleep apnea 03/2018   Ascending aortic aneurysm (HCC) 12/23/2017   Typical atrial flutter (HCC) 12/16/2017   Supraventricular tachycardia 12/09/2017   Essential hypertension 12/09/2017   Dyspnea on exertion 12/09/2017   Rheumatoid arthritis (HCC) 12/09/2017   Dysrhythmia 11/2017   Acute foreign body of ear canal 11/12/2015    Additional Tests and Measures from other records:   Neuroimaging Results: MRI conducted on 09/11/2020 suggested mild age-appropriate atrophy and mild age-appropriate microvascular ischemic changes.   11/09/2022:  Sleep Study:  IMPRESSION:  This HST confirms the presence of severe sleep apnea ( OSA ) which was not REM sleep accentuated. There was also severest hypoxia, for more than half of the total sleep time.  Sleep quality was poor, with 135 minutes of wakefulness after sleep onset.   It is the hypoxemia that is most concerning. I have no explanation for this , could this be an artefact?  Tests Administered: Finger Tapping Test (FTT) Grooved Pegboard Wechsler Adult Intelligence Scale, 4th Edition (WAIS-IV) Wechsler Memory  Scale, 4th Edition (WMS-IV); Older Adult Battery   Participation Level:   Active  Participation Quality:  Appropriate      Behavioral  Observation:  The patient appeared well-groomed and appropriately dressed. His manners were polite and appropriate to the situation. The patient's attitude towards testing was positive and his effort was good.   Well Groomed, Alert, and Appropriate.   Test Results:   Initially, an estimation was made as to the patient's historic/premorbid intellectual and cognitive functioning.  The patient graduated from high school and engaged in sports activity and clubs during school.  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  We will utilize a conservative estimate of the patient function in the average range relative to a normative population although he likely had high average functioning and individual cognitive domains particularly around expressive language capacity etc.  Secondly, an estimate of validity of the current test procedures was made.  The patient appeared to fully participate throughout the testing procedures maintaining a positive attitude and good effort throughout.  Embedded validity checks including aspects of his subtest from working memory and forced choice recognition type challenges under the memory test all suggest that the patient fully participated and gave good effort throughout suggesting a valid administration.  FTT:  R (DH) Average= 43.2  Percentile Rank= 45th L (NDH) Average= 52.2  Percentile Rank= 62nd As the patient has been describing some tremors in his left hand as well as changes in coordination and weakness with a tendency to drop things he was administered the finger tapping Test and grooved pegboard measures to assess not only fine motor  speed but fine motor coordination.  There was some consideration of a very small/localized region in his left frontal brain region that may be reflective of a small lacunar infarction in the past.  On the finger tapping Test, the patient performed in the average range for both right dominant hand and left nondominant hand with the left hand doing somewhat better than the right hand as far as fine motor speed.  There were no clear evidence of lateralization of findings although the patient did relatively better for his left nondominant hand versus right dominant hand.  Grooved Pegboard:  R (DH) time= 103s Percentile Rank= 40th L (NDH) time= 120s  Percentile Rank= 40th   On the grooved pegboard test, the patient showed consistent functioning between right dominant hand and left nondominant hand both of which fell at the 40th percentile relative to normative comparisons.  There was no indication of lateralization of findings.  While the patient had noted dropping objects in the past he did not drop any of the keys during this measure and performed relatively well.  WAIS-IV:            Composite Score Summary          Scale Sum of Scaled Scores Composite Score Percentile Rank 95% Conf. Interval Qualitative Description  Verbal Comprehension 34 VCI 107 68 101-112 Average  Perceptual Reasoning 28 PRI 96 39 90-102 Average  Working Memory 20 WMI 100 50 93-107 Average  Processing Speed 23 PSI 108 70 99-116 Average  Full Scale 105 FSIQ 103 58 99-107 Average  General Ability 62 GAI 101 53 96-106 Average    In order to provide a thorough assessment of a wide range of cognitive abilities and a highly structured excellently normed assessment he was administered the Wechsler Adult Intelligence Scale-IV.    It  should be noted that the patient and his wife are both describing changes in cognitive functioning and changes in memory and his current score should not be considered reflexive of his lifelong  functioning but a description of his current cognitive functioning. 2 Global scores were calculated.  The patient produced a full-scale IQ score of 103 which falls at the 58th percentile and in the average range.  This is only slightly below predictions of premorbid functioning suggesting a relatively narrow or isolated change in various cognitive functioning domains.  We also calculated the patient's general abilities index score which was a 101 falling at the 53rd percentile and also in the average range.  There was little variability and other composite score/cognitive domains with the patient doing best on measures of information processing speed and focus execute abilities along with vocabulary and verbal skills and his greatest area of relative weakness had to do with perceptual reasoning and visual-spatial/processing components.  However, all of these measures were within the average range and only slightly below predicted levels of premorbid functioning.           Verbal Comprehension Subtests Summary        Subtest Raw Score Scaled Score Percentile Rank Reference Group Scaled Score SEM  Similarities 25 11 63 10 0.95  Vocabulary 39 11 63 11 0.67  Information 18 12 75 13 0.73   The patient produced a verbal comprehension index score of 107 as well as of the 68th percentile and is in the average range relative to normative population.  Little scatter was noted within subtest performance.  The patient performed consistent with premorbid conservative estimates on measures of verbal reasoning and problem-solving, capacity to bring up vocabulary knowledge and general fund of information.  There were no indications of any significant level of cognitive deficits in this domain.           Perceptual Reasoning Subtests Summary        Subtest Raw Score Scaled Score Percentile Rank Reference Group Scaled Score SEM  Block Design 32 11 63 7 0.99  Matrix Reasoning 10 9 37 5 0.90  Visual Puzzles 9 8 25 6   0.99          Working Comptroller Raw Score Scaled Score Percentile Rank Reference Group Scaled Score SEM  Digit Span 26 11 63 9 0.73  Arithmetic 12 9 37 9 1.20          Processing Speed Subtests Summary        Subtest Raw Score Scaled Score Percentile Rank Reference Group Scaled Score SEM  Symbol Search 31 13 84 9 1.12  Coding 52 10 50 6 1.12    The patient produced a perceptual reasoning index score of 96 which falls at the 39th percentile and in the average range.  This is the patient's lowest area of functioning although it is still in the average range.  There was some degree of variability in subtest performance but not significant.  The patient performed consistent with premorbid estimates on measures of his ability to analyze geometric patterns and make part-whole recognition decisions.  The patient also performed in the average range on measures of nonverbal reasoning skills and broad visual intelligence, perceptual organizational skills and his attention to detail and visual fluid reasoning skills.  There were no areas of significant deficit in this domain.  The patient produced a working memory index score of 100 which falls at  the 50th percentile and in the average range relative to a normative population.  Little scatter was noted in subtest performance and the patient performed at the 63rd percentile on measures of primary auditory encoding and his ability to perform simple processing information in his active auditory Register.  On the arithmetic subtest which requires greater processing of information in his auditory Register the patient did well relative to normative population and only slightly below estimates of premorbid functioning.  There are clearly no significant deficits in this domain and his level of performance on auditory encoding measures would not suggest that significant encoding deficits were present to account for subjective reports of memory  deficits as being a primary attentional deficit.  The patient produced a processing speed index score of 108 which falls at the [facility] and is in the average range relative to a normative population.  The patient did quite well on one of the visual scanning and visual searching measures and performed in the average range on the other.  These performances would suggest well preserved focus execute abilities and capacity to process information rapidly, which is consistent with the patient's premorbid history.  In fact, the patient performed at the 84th percentile on one of the individual subtests.   WMS-IV:          Index Score Summary        Index Sum of Scaled Scores Index Score Percentile Rank 95% Confidence Interval Qualitative Descriptor  Auditory Memory (AMI) 36 94 34 88-101 Average  Visual Memory (VMI) 19 98 45 93-103 Average  Immediate Memory (IMI) 28 95 37 89-101 Average  Delayed Memory (DMI) 27 93 32 86-101 Average    In order to objectively assess memory and learning in an objective systematic way the patient was administered the Wechsler Memory Scale-IV.  On the Wechsler Adult Intelligence Scale the patient performed in the average range on measures of auditory encoding and capacity to process information in his active auditory Register.  On similar visual encoding measures on the Wechsler Memory Scale the patient performed in the same fashion producing performances in the average range relative to a normative population.  If anything this is only a slight change from estimations of premorbid functioning although this may have been one of his relative strengths.  There are clearly no significant deficits in this encoding domain and his level of current performances would not indicate a significant component of attentional deficit creating his reports of subjective memory changes.  Breaking memory functions down between auditory and visual memory the patient produced an auditory  memory index score of 94 which falls at the 34th percentile and is in the lower end of the average range.  I suspect that this reflects some significant change from premorbid functioning although is still in the average range relative to a normative population.  The patient did somewhat better on visual memory components producing a visual memory index score of 98 which fell at the 45th percentile and in the average range relative to a normative population.  Again, given the patient's work history I suspect that this is below his premorbid functioning levels as his jobs would have required good to excellent memory functions although they do not indicate significant or severe memory deficits relative to a normative population.  Breaking memory functions down between immediate versus delayed the patient produced an immediate memory index score of 95 which fell at the 37th percentile and in the average range.  The patient maintained the amount  of information that he initially learned after period of delay reducing a delayed memory index score of 93 which fell at the 32nd percentile.  While both of these measures are likely below his premorbid level of functioning they do not represent severe or significant deficits in memory and learning for either auditory or visual information.  Looking at individual subtest the patient showed great consistency.  The patient performed within expected limits on recognition/cued recall components.  Overall, the patient is learning new information both visually and auditorily and retaining that information after period of delay in the average range relative to a normative population.  However, given the patient's work history and subjective reports I suspect that these are below what would have been his historic/premorbid functioning and do suggest some change but they are not indicative of significant or severe deficits in memory or learning.  There is no clear differences between  auditory versus visual learning and memory.         Primary Subtest Scaled Score Summary       Subtest Domain Raw Score Scaled Score Percentile Rank  Logical Memory I AM 36 12 75  Logical Memory II AM 12 8 25   Verbal Paired Associates I AM 13 7 16   Verbal [facility]ired Associates II AM 5 9 37  Visual Reproduction I VM 27 9 37  Visual Reproduction II VM 17 10 50  Symbol Span VWM 14 9 37          Auditory Memory Process Score Summary      Process Score Raw Score Scaled Score Percentile Rank Cumulative Percentage (Base Rate)  LM II Recognition 20 - - >75%  VPA II Recognition 25 - - 17-25%         Visual Memory Process Score Summary      Process Score Raw Score Scaled Score Percentile Rank Cumulative Percentage (Base Rate)  VR II Recognition 4 - - 26-50%    ABILITY-MEMORY ANALYSIS   Ability Score:    VCI: 107 Date of Testing:           WAIS-IV; WMS-IV 2022/09/15             Predicted Difference Method    Index Predicted WMS-IV Index Score Actual WMS-IV Index Score Difference Critical Value   Significant Difference Y/N Base Rate  Auditory Memory 104 94 10 10.41 N    Visual Memory 103 98 5 7.35 N    Immediate Memory 104 95 9 9.69 N    Delayed Memory 104 93 11 12.22 N      Summary of Results:   Overall, the results of the current neuropsychological evaluation do demonstrate a great consistency between a wide range of cognitive domains.  While the patient is likely performed in the average to high average range relative to a normative population as far as conservative estimates of premorbid functioning the patient continues to function generally in the average range or lower end of the average range.  This does suggest some slight decrease from premorbid functioning but no clear defined areas of cognitive deficits.  There was some slight reduction in fine motor speed relatively speaking for the patient's right dominant hand but this was not significant in nature and no significant  findings of changes in fine motor control.  Performances in a wide range of cognitive domains were all in the average range including his verbal based skills including vocabulary, verbal reasoning and retention of his general fund of knowledge.  There were no  clear visual spatial or visual processing deficits, no clear or significant changes in his focus execute abilities and information processing speed, and no clear or significant changes in his auditory or visual encoding capacity.  Memory and learning performances were generally consistent with what would be expected based on his attentional capacity all of which were in the average range.  However, I do suspect that the patient likely performed better premorbidly as far as his memory and learning capacity and was likely at least in the high average range premorbidly.  This does suggest some mild relative loss in memory and learning and some other cognitive domains but no significant deficits or moderate/severe impairments were noted in any various cognitive domain.  Impression/Diagnosis:   The results of the current neuropsychological evaluation are quite encouraging.  While I suspect that there is some general cognitive decline relative to premorbid functioning levels there was no pattern of deficits noted outside of a general loss and overall cognitive functioning relative to estimates of premorbid functioning.  These were very mild in nature and all cognitive domains assessed were in the average range relative to a normative population.  I suspect that there were some or most areas of functioning in the upper end of the average to high average range relative to a normative population premorbidly so this may reflect some mild or subtle changes but not enough to explain the patient and his wife's description of significant changes in cognitive functioning and in particular weaknesses in memory and learning.  Visual-spatial and visual processing functioning  were consistent with other cognitive domains and his general information processing speed was also consistent.  Average auditory and verbal encoding was noted with consistent performance on memory and learning domains to his encoding capacity.  His memory was slightly below predicted levels of premorbid functioning and slightly below predicted levels of functioning based on predictive model that utilized his verbal based skills to estimate his actual memory and learning.  However, these again were only slightly below predicted levels and not indicative of significant memory and learning deficits.  As far as diagnostic considerations, the patient's pattern of cognitive strengths and weaknesses looking at within test performance as well as comparisons to estimates of premorbid functioning are not consistent with any type of pattern seen with progressive degenerative neurological conditions.  The pattern is not consistent with those typically seen with Alzheimer's, Lewy body or other cortical type dementia's and not consistent with patterns seen with various subcortical major neurocognitive disorders.  During the initial clinical interview I had some concerns about the patient's potential for significant sleep apnea and the patient had already been encouraged to do a follow-up sleep study.  The patient was diagnosed with obstructive sleep apnea many years ago and only briefly used his CPAP machine.  Since my initial clinical visit with the patient he has had repeat sleep study performed which identified severe obstructive sleep apnea that was not REM accentuated as well as severe hypoxemia although the degree of hypoxemia was at least potentially considered to be an artifact although it was clearly of great concern.  The level of untreated obstructive sleep apnea the patient displayed would clearly be sufficient to describe and explain the descriptions of the patient and his wife regarding the patient having various  cognitive changes and memory and learning and acute episodes of confusion etc.  Even the patient's rare visual hallucinations could be accounted for by severe sleep disturbance.  The patient has also had significant stressors with  anxiety and depressive symptoms during heightened psychosocial stressors starting around the time of described cognitive changes.  The patient has been started on both Aricept and Namenda without apparent side effect although he describes more vivid dreams at times it could be reflexive of his sleep disturbance and/or potential mild side effect of the Aricept.  In any event, the current objective assessment is not consistent with a Alzheimer's type major neurocognitive disorder.  However, as the patient may have premorbidly been functioning higher than estimates this cannot be completely ruled out at this time although it is not particularly likely.  The patient is strongly encouraged to follow-up diligently with his CPAP device once it is titrated and use it every night as well as during the day if he takes any naps.  The patient should not attempt to sleep without his CPAP device.  Given the various stressors that the patient has been through I would also suggest that a consideration of restarting an SSRI medication again.  The patient took Cymbalta in the past during some of these acute psychosocial stressors and it would not be an unwise decision to attempt to restart Cymbalta or try some type of SSRI to help with mood.  We will schedule the patient to return for repeat testing in approximately 9 to 12 months to make direct comparisons to his current cognitive functioning.  This will allow for an extended period of time of regular CPAP use to see if his performances improve, which I suspect they will.  I will sit down with the patient and his wife and go over the results of the current neuropsychological evaluation and going to greater individual recommendations that are outside of  specific medical recommendations.  Diagnosis:    Memory loss  Mild cognitive disorder  Obstructive sleep apnea syndrome   _____________________ Arley Phenix, Psy.D. Clinical Neuropsychologist

## 2022-12-14 NOTE — Procedures (Signed)
[facility] POLYSOMNOGRAPHY  INTERPRETATION REPORT   STUDY DATE:  12/06/2022     PATIENT NAME:  Brett Rosales         DATE OF BIRTH:  1945/09/19  PATIENT ID:  [id number]    TYPE OF STUDY:  CPAP  READING PHYSICIAN: Brett Novas, MD REFERRED BY: Brett Feinstein, MD- Brett Rosales  SCORING TECHNICIAN: Brett Rosales, RPSGT   HISTORY:  This 77 year-old Male patient was  invited to an in- lab PAP titration after a HST had shown severe sleep apnea and hypoxia with a NREM sleep dominance, raising a suspicion of central apnea being present. Clinical concern of asymmetrical tremor, subjective cognitive impairment, and cardiac disease.   ADDITIONAL INFORMATION:  The Epworth Sleepiness Scale endorsed at 1 /24 points (scores above or equal to 10 are suggestive of hypersomnolence). Height: 74 in Weight: 269 lbs (BMI 34) Neck Size: 18 in  MEDICATIONS: Vitamin B complex, Vitamin D3, DSS, Cymbalta, Folvite, Plaquenil, Remicade IV, Cozaar, Methotrexate, Ritalin, Lopressor, Pravachol, red yeast rice, Xarelto, Aricept, Namenda TECHNICAL DESCRIPTION: A registered sleep technologist ( RPSGT)  was in attendance for the duration of the recording.  Data collection, scoring, video monitoring, and reporting were performed in compliance with the AASM Manual for the Scoring of Sleep and Associated Events; (Hypopnea is scored based on the criteria listed in Section VIII D. 1b in the AASM Manual V2.6 using a 4% oxygen desaturation rule or Hypopnea is scored based on the criteria listed in Section VIII D. 1a in the AASM Manual V2.6 using 3% oxygen desaturation and /or arousal rule).   SLEEP CONTINUITY AND SLEEP ARCHITECTURE:  Lights-out was at 20:54: and lights-on at  04:54:, with  8.0 hours of recording time . Total sleep time ( TST) was 413.5 minutes with a normal sleep efficiency at 86.1%.  The patient was initially placed on a Medium SIMPLUS FFM at 5 cm water pressure of CPAP and titrated  to   Sleep latency was increased at 31.0 minutes.  REM sleep latency was normal at 59.5 minutes. Of the total sleep time, the percentage of stage N1 sleep was 14.8%, stage N2 sleep was 67%, stage N3 sleep was 0.0%, and REM sleep was 17.8%.  BODY POSITION: Duration of total sleep and percent of total sleep in their respective position is as follows: supine 235 minutes (57%), non-supine 178 minutes (43%); right 92 minutes (22%), left 85 minutes (21%), and prone 00 minutes (0%). Total supine REM sleep time was 30 minutes (41% of total REM sleep). There were 9 Stage R periods observed on this study night, 30 awakenings (i.e. transitions to Stage W from any sleep stage), and 109 total stage transitions. Wake after sleep onset (WASO) time accounted for 35 minutes.   RESPIRATORY MONITORING:   Based on CMS criteria (using a 4% oxygen desaturation rule for scoring hypopneas), there were 43 apneas (41 obstructive; 0 central; 2 mixed), and 46 hypopneas.  Apnea index was 6.2. Hypopnea index was 6.7.  The AHI =apnea-hypopnea index was 12.9/h overall (16.1/h supine, 7 non-supine; AHI was only 4.1/h REM, 0.0 supine REM). There were 0 respiratory effort-related arousals (RERAs).   OXIMETRY: Oxyhemoglobin Saturation Nadir during sleep was at  83% from a mean of 93%.  Of the Total sleep time (TST) in hypoxemia (<89%) was present for 28.5 minutes, or 6.9% of total sleep time.  LIMB MOVEMENTS: There were 492 periodic limb movements of sleep (71.4/h), of which 10 (1.5/h) were associated with an  arousal (!). Limb movements continued during REM sleep.  AROUSAL: There were 106 arousals in total, for an arousal index of 12 arousals/hour.  Of these, 53 were identified as respiratory-related arousals (8 /h), 10 were PLM-related arousals (1 /h), and 43 were non-specific arousals (6 /h). EEG:   normal amplitude and frequency, with symmetric manifestation of sleep stages. EKG: The electrocardiogram documented a regular heart rate.   The average heart rate during sleep was 53 bpm.  The heart rate during sleep varied between a minimum of  48 bpm and  a maximum of  80 bpm.  IMPRESSION: Sleep disordered breathing was present and again dominantly seen in NREM sleep - and associated with frequent intermittent hypoxic events.  This apnea was of mixed type and clearly dependent on supine sleep.  CPAP from 5 through 16 cm water pressure did not significantly lower the AHI, neither the first tried BiPAP pressure of 16/12 cm water, but 87 minutes under BiPAP therapy at 18/14 cm water did. BiPAP therapy did not correct hypoxia.     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    3) Sleep efficiency was normal- actually higher than in seen in the preceding HST.  Total sleep time was within normal limits at 413.5 minutes.  Sleep efficiency was normal at 86.1%.    Sleep fragmentation was noted- The majority of sleep arousals were related to NREM sleep apneas and hypopneas in supine sleep.   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 We will prescribe BiPAP and urge the patient to avoid supine sleep.   2) Sleep hypoxia was still present under final BiPAP pressure titration, but did not drop below 89%.  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 This diagnosis has to be evaluated in context with history of symptoms and physical findings.   Brett Novas, MD [facility] CPAP/Bilevel Report    General Information  Name: Brett Rosales BMI: 34 Physician: ,   ID: [id number] Height: 74 in Technician: Brett Rosales  Sex: Male Weight: 269 lb Record: [id number]  Age: 65 [September 14, 1945] Date: 12/06/2022 Scorer: Brett Rosales    Recommended Settings IPAP: N/A cmH20 EPAP: N/A cmH2O AHI: N/A AHI (4%): N/A   Pressure IPAP/EPAP 00 05 06 08 10 11 12 16  / 12   O2 Vol 0.0 0.0 0.0 0.0 0.0 0.0 0.0 0.0  Time TRT 0.24m 40.37m 11.85m 11.21m 19.81m 24.38m 21.97m 14.44m   TST 0.18m 8.14m 11.18m 11.72m 18.35m 23.31m 20.21m 14.44m  Sleep Stage % Wake 0.0 11.1 0.0 0.0 2.6 2.1 4.7 3.4   % REM 0.0 0.0 0.0 0.0 0.0 63.8 85.4 0.0   % N1 0.0 18.8 13.0 0.0 13.5 0.0 14.6 7.1   % N2 0.0 81.3 87.0 100.0 86.5 36.2 0.0 92.9   % N3 0.0 0.0 0.0 0.0 0.0 0.0 0.0 0.0  Respiratory Total Events 0 7 15 14 1 2 2 14    Obs. Apn. 0 7 10 7  0 0 0 0   Mixed Apn. 0 0 1 0 0 0 0 0   Cen. Apn. 0 0 0 0 0 0 0 0   Hypopneas 0 0 4 7 1 2 2 14    AHI 0.00  [facility] POLYSOMNOGRAPHY  INTERPRETATION REPORT   STUDY DATE:  12/06/2022     PATIENT NAME:  Brett Rosales         DATE OF BIRTH:  1945/09/19  PATIENT ID:  [id number]    TYPE OF STUDY:  CPAP  READING PHYSICIAN: Brett Novas, MD REFERRED BY: Brett Feinstein, MD- Brett Rosales  SCORING TECHNICIAN: Brett Rosales, RPSGT   HISTORY:  This 77 year-old Male patient was  invited to an in- lab PAP titration after a HST had shown severe sleep apnea and hypoxia with a NREM sleep dominance, raising a suspicion of central apnea being present. Clinical concern of asymmetrical tremor, subjective cognitive impairment, and cardiac disease.   ADDITIONAL INFORMATION:  The Epworth Sleepiness Scale endorsed at 1 /24 points (scores above or equal to 10 are suggestive of hypersomnolence). Height: 74 in Weight: 269 lbs (BMI 34) Neck Size: 18 in  MEDICATIONS: Vitamin B complex, Vitamin D3, DSS, Cymbalta, Folvite, Plaquenil, Remicade IV, Cozaar, Methotrexate, Ritalin, Lopressor, Pravachol, red yeast rice, Xarelto, Aricept, Namenda TECHNICAL DESCRIPTION: A registered sleep technologist ( RPSGT)  was in attendance for the duration of the recording.  Data collection, scoring, video monitoring, and reporting were performed in compliance with the AASM Manual for the Scoring of Sleep and Associated Events; (Hypopnea is scored based on the criteria listed in Section VIII D. 1b in the AASM Manual V2.6 using a 4% oxygen desaturation rule or Hypopnea is scored based on the criteria listed in Section VIII D. 1a in the AASM Manual V2.6 using 3% oxygen desaturation and /or arousal rule).   SLEEP CONTINUITY AND SLEEP ARCHITECTURE:  Lights-out was at 20:54: and lights-on at  04:54:, with  8.0 hours of recording time . Total sleep time ( TST) was 413.5 minutes with a normal sleep efficiency at 86.1%.  The patient was initially placed on a Medium SIMPLUS FFM at 5 cm water pressure of CPAP and titrated  to   Sleep latency was increased at 31.0 minutes.  REM sleep latency was normal at 59.5 minutes. Of the total sleep time, the percentage of stage N1 sleep was 14.8%, stage N2 sleep was 67%, stage N3 sleep was 0.0%, and REM sleep was 17.8%.  BODY POSITION: Duration of total sleep and percent of total sleep in their respective position is as follows: supine 235 minutes (57%), non-supine 178 minutes (43%); right 92 minutes (22%), left 85 minutes (21%), and prone 00 minutes (0%). Total supine REM sleep time was 30 minutes (41% of total REM sleep). There were 9 Stage R periods observed on this study night, 30 awakenings (i.e. transitions to Stage W from any sleep stage), and 109 total stage transitions. Wake after sleep onset (WASO) time accounted for 35 minutes.   RESPIRATORY MONITORING:   Based on CMS criteria (using a 4% oxygen desaturation rule for scoring hypopneas), there were 43 apneas (41 obstructive; 0 central; 2 mixed), and 46 hypopneas.  Apnea index was 6.2. Hypopnea index was 6.7.  The AHI =apnea-hypopnea index was 12.9/h overall (16.1/h supine, 7 non-supine; AHI was only 4.1/h REM, 0.0 supine REM). There were 0 respiratory effort-related arousals (RERAs).   OXIMETRY: Oxyhemoglobin Saturation Nadir during sleep was at  83% from a mean of 93%.  Of the Total sleep time (TST) in hypoxemia (<89%) was present for 28.5 minutes, or 6.9% of total sleep time.  LIMB MOVEMENTS: There were 492 periodic limb movements of sleep (71.4/h), of which 10 (1.5/h) were associated with an  [facility] POLYSOMNOGRAPHY  INTERPRETATION REPORT   STUDY DATE:  12/06/2022     PATIENT NAME:  Brett Rosales         DATE OF BIRTH:  1945/09/19  PATIENT ID:  [id number]    TYPE OF STUDY:  CPAP  READING PHYSICIAN: Brett Novas, MD REFERRED BY: Brett Feinstein, MD- Brett Rosales  SCORING TECHNICIAN: Brett Rosales, RPSGT   HISTORY:  This 77 year-old Male patient was  invited to an in- lab PAP titration after a HST had shown severe sleep apnea and hypoxia with a NREM sleep dominance, raising a suspicion of central apnea being present. Clinical concern of asymmetrical tremor, subjective cognitive impairment, and cardiac disease.   ADDITIONAL INFORMATION:  The Epworth Sleepiness Scale endorsed at 1 /24 points (scores above or equal to 10 are suggestive of hypersomnolence). Height: 74 in Weight: 269 lbs (BMI 34) Neck Size: 18 in  MEDICATIONS: Vitamin B complex, Vitamin D3, DSS, Cymbalta, Folvite, Plaquenil, Remicade IV, Cozaar, Methotrexate, Ritalin, Lopressor, Pravachol, red yeast rice, Xarelto, Aricept, Namenda TECHNICAL DESCRIPTION: A registered sleep technologist ( RPSGT)  was in attendance for the duration of the recording.  Data collection, scoring, video monitoring, and reporting were performed in compliance with the AASM Manual for the Scoring of Sleep and Associated Events; (Hypopnea is scored based on the criteria listed in Section VIII D. 1b in the AASM Manual V2.6 using a 4% oxygen desaturation rule or Hypopnea is scored based on the criteria listed in Section VIII D. 1a in the AASM Manual V2.6 using 3% oxygen desaturation and /or arousal rule).   SLEEP CONTINUITY AND SLEEP ARCHITECTURE:  Lights-out was at 20:54: and lights-on at  04:54:, with  8.0 hours of recording time . Total sleep time ( TST) was 413.5 minutes with a normal sleep efficiency at 86.1%.  The patient was initially placed on a Medium SIMPLUS FFM at 5 cm water pressure of CPAP and titrated  to   Sleep latency was increased at 31.0 minutes.  REM sleep latency was normal at 59.5 minutes. Of the total sleep time, the percentage of stage N1 sleep was 14.8%, stage N2 sleep was 67%, stage N3 sleep was 0.0%, and REM sleep was 17.8%.  BODY POSITION: Duration of total sleep and percent of total sleep in their respective position is as follows: supine 235 minutes (57%), non-supine 178 minutes (43%); right 92 minutes (22%), left 85 minutes (21%), and prone 00 minutes (0%). Total supine REM sleep time was 30 minutes (41% of total REM sleep). There were 9 Stage R periods observed on this study night, 30 awakenings (i.e. transitions to Stage W from any sleep stage), and 109 total stage transitions. Wake after sleep onset (WASO) time accounted for 35 minutes.   RESPIRATORY MONITORING:   Based on CMS criteria (using a 4% oxygen desaturation rule for scoring hypopneas), there were 43 apneas (41 obstructive; 0 central; 2 mixed), and 46 hypopneas.  Apnea index was 6.2. Hypopnea index was 6.7.  The AHI =apnea-hypopnea index was 12.9/h overall (16.1/h supine, 7 non-supine; AHI was only 4.1/h REM, 0.0 supine REM). There were 0 respiratory effort-related arousals (RERAs).   OXIMETRY: Oxyhemoglobin Saturation Nadir during sleep was at  83% from a mean of 93%.  Of the Total sleep time (TST) in hypoxemia (<89%) was present for 28.5 minutes, or 6.9% of total sleep time.  LIMB MOVEMENTS: There were 492 periodic limb movements of sleep (71.4/h), of which 10 (1.5/h) were associated with an  arousal (!). Limb movements continued during REM sleep.  AROUSAL: There were 106 arousals in total, for an arousal index of 12 arousals/hour.  Of these, 53 were identified as respiratory-related arousals (8 /h), 10 were PLM-related arousals (1 /h), and 43 were non-specific arousals (6 /h). EEG:   normal amplitude and frequency, with symmetric manifestation of sleep stages. EKG: The electrocardiogram documented a regular heart rate.   The average heart rate during sleep was 53 bpm.  The heart rate during sleep varied between a minimum of  48 bpm and  a maximum of  80 bpm.  IMPRESSION: Sleep disordered breathing was present and again dominantly seen in NREM sleep - and associated with frequent intermittent hypoxic events.  This apnea was of mixed type and clearly dependent on supine sleep.  CPAP from 5 through 16 cm water pressure did not significantly lower the AHI, neither the first tried BiPAP pressure of 16/12 cm water, but 87 minutes under BiPAP therapy at 18/14 cm water did. BiPAP therapy did not correct hypoxia.     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    3) Sleep efficiency was normal- actually higher than in seen in the preceding HST.  Total sleep time was within normal limits at 413.5 minutes.  Sleep efficiency was normal at 86.1%.    Sleep fragmentation was noted- The majority of sleep arousals were related to NREM sleep apneas and hypopneas in supine sleep.   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 We will prescribe BiPAP and urge the patient to avoid supine sleep.   2) Sleep hypoxia was still present under final BiPAP pressure titration, but did not drop below 89%.  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 This diagnosis has to be evaluated in context with history of symptoms and physical findings.   Brett Novas, MD [facility] CPAP/Bilevel Report    General Information  Name: Brett Rosales BMI: 34 Physician: ,   ID: [id number] Height: 74 in Technician: Brett Rosales  Sex: Male Weight: 269 lb Record: [id number]  Age: 65 [September 14, 1945] Date: 12/06/2022 Scorer: Brett Rosales    Recommended Settings IPAP: N/A cmH20 EPAP: N/A cmH2O AHI: N/A AHI (4%): N/A   Pressure IPAP/EPAP 00 05 06 08 10 11 12 16  / 12   O2 Vol 0.0 0.0 0.0 0.0 0.0 0.0 0.0 0.0  Time TRT 0.24m 40.37m 11.85m 11.21m 19.81m 24.38m 21.97m 14.44m   TST 0.18m 8.14m 11.18m 11.72m 18.35m 23.31m 20.21m 14.44m  Sleep Stage % Wake 0.0 11.1 0.0 0.0 2.6 2.1 4.7 3.4   % REM 0.0 0.0 0.0 0.0 0.0 63.8 85.4 0.0   % N1 0.0 18.8 13.0 0.0 13.5 0.0 14.6 7.1   % N2 0.0 81.3 87.0 100.0 86.5 36.2 0.0 92.9   % N3 0.0 0.0 0.0 0.0 0.0 0.0 0.0 0.0  Respiratory Total Events 0 7 15 14 1 2 2 14    Obs. Apn. 0 7 10 7  0 0 0 0   Mixed Apn. 0 0 1 0 0 0 0 0   Cen. Apn. 0 0 0 0 0 0 0 0   Hypopneas 0 0 4 7 1 2 2 14    AHI 0.00

## 2022-12-15 ENCOUNTER — Other Ambulatory Visit: Payer: Self-pay | Admitting: Cardiology

## 2022-12-16 ENCOUNTER — Encounter: Payer: Medicare Other | Admitting: Psychology

## 2022-12-20 ENCOUNTER — Encounter: Payer: Self-pay | Admitting: Psychology

## 2022-12-20 ENCOUNTER — Other Ambulatory Visit: Payer: Self-pay

## 2022-12-20 ENCOUNTER — Encounter (HOSPITAL_BASED_OUTPATIENT_CLINIC_OR_DEPARTMENT_OTHER): Payer: Medicare Other | Admitting: Psychology

## 2022-12-20 ENCOUNTER — Telehealth: Payer: Self-pay | Admitting: Neurology

## 2022-12-20 DIAGNOSIS — G4733 Obstructive sleep apnea (adult) (pediatric): Secondary | ICD-10-CM | POA: Diagnosis not present

## 2022-12-20 DIAGNOSIS — R413 Other amnesia: Secondary | ICD-10-CM | POA: Diagnosis not present

## 2022-12-20 DIAGNOSIS — F09 Unspecified mental disorder due to known physiological condition: Secondary | ICD-10-CM | POA: Diagnosis not present

## 2022-12-20 DIAGNOSIS — Z79899 Other long term (current) drug therapy: Secondary | ICD-10-CM | POA: Diagnosis not present

## 2022-12-20 DIAGNOSIS — G4734 Idiopathic sleep related nonobstructive alveolar hypoventilation: Secondary | ICD-10-CM | POA: Diagnosis not present

## 2022-12-20 DIAGNOSIS — I10 Essential (primary) hypertension: Secondary | ICD-10-CM | POA: Diagnosis not present

## 2022-12-20 MED ORDER — RIVAROXABAN 20 MG PO TABS: 20.0000 mg | ORAL_TABLET | Freq: Every day | ORAL | 0 refills | Status: DC

## 2022-12-20 NOTE — Telephone Encounter (Signed)
-----   Message from [place] D[place]meier sent at 12/14/2022  2:11 PM EDT ----- CPAP unresponsive sleep apnea with unusual NREM sleep dominance and REM sleep persistent limb movements. Hypoxia.  BiPAP at 18/ 14 cm water worked best as long as the patient doesn't sleep on his back.  Order 4 cm spread BiPAP 18/14 cm water with an SIMPLUS FFM in medium ( which the patient picked ) and chin strap.  DME ordered- please clarify which company he wants to use , please.

## 2022-12-20 NOTE — Progress Notes (Signed)
Neuropsychological Evaluation   Patient:  Brett Rosales   DOB: 1945-09-14  MR Number: [id number]  Location: [facility] [address] [place], [address] [place] [address] Dept: [phone]  Start: 1 PM End: 2 PM  Provider/Observer:     Hershal Coria PsyD  Chief Complaint:      Chief Complaint  Patient presents with   Memory Loss   Sleeping Problem   Stress   12/20/2022 1 PM-2 PM: Today's visit was conducted in my outpatient clinic office with the patient, his wife and myself present.  Today we reviewed the results of the recent neuropsychological evaluation with specific recommendations to the patient going forward and consideration of diagnostic criterion.  The patient actually did fairly well on cognitive testing and did not show any patterns consistent with a major neurocognitive disorder itself.  Patient has recently had sleep study that showed significant chronic intermittent hypoxemia with obstructive sleep apnea since the evaluation was conducted.  As he has some other features that cannot completely rule out early stages of Lewy body type process with his rather unusual sleep pattern and some isolated events of visual hallucination his cognitive strengths and weaknesses are not particularly consistent with even early stages of Lewy body.  I do suspect that we will see some significant improvements once he starts using his BiPAP machine.  He is waiting for it to be delivered.  We have set the patient up for follow-up neuropsychological evaluation in 9 to 12 months to assess for any progressive change in be more definitive about diagnostic considerations.  I have included the reason for service and the summary of the neuropsychological evaluation below for convenience and it can be found in its entirety in the patient's EMR dated 12/14/2022.  Reason For Service:     Brett Rosales is a  77 year old male referred for a neuropsychological evaluation by Margie Ege, NP with [facility].  Patient was initially seen by Dr. 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  Patient and wife have noticed a gradual onset of memory loss that began around the time of the start of COVID (2019/2020).  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  Patient's wife felt that she had noted changes in memory and forgetting new information to a greater degree after each surgery.  The patient has had repeat MoCA examinations over this time and have ranged between 23/30 and 28/30.  His most recent MoCA was 23/30.  The patient has not been able to exercise regularly and has a recent weight gain but had lost considerable amount of weight from years prior.  The patient is noted to have very loud snoring and will wake up sometimes in the middle of the night catching his breath.  Patient noted symptoms consistent with an apneic event even while awake recently in the dentist office.  Patient reports that he was diagnosed with obstructive sleep apnea 25 years ago and wore CPAP machine briefly.  Patient has been hesitant around evaluation for obstructive sleep apnea.  A good bit of time was spent in today's visit encouraging him to follow-up with that as it absolutely could be playing at least some degree of a role in his current  symptoms.  Note:  Patient had new sleep study performed by Dr. Vickey Huger and interpreted on 11/09/2022 with identification of severe obstructive sleep apnea and most concerning hypoxemia although there was some concern noted about hypoxemia potentially being artifact.  The patient is returning for titration of CPAP.   During the clinical visit in  my office (09/02/2022), the patient and wife report that the last 4 years have been really tough.  There were a lot of psychosocial issues associated with her attempts to help 2 brothers who are having increasing medical and cognitive issues.  The patient describes increasing difficulty remembering where he put things and recalling any new information.  He has had some isolated events of geographic disorientation including missing turns of places that he should have been very familiar with.  The patient reports that he has not really gotten lost during these events but did get turned around and went the wrong way for period of time.  The patient notes multiple things that been making life harder than it should be.  The patient reports that he has a very mild tremor at times in his left hand and has some weakness in his left arm but has had a past left shoulder surgery.  The patient describes very rare visual hallucination at night with no delusions.  The patient denies any word finding issues except when he is very agitated and/or stressed.  He reports that his biggest issue is remembering to do various things that he had planned on doing and misplacing objects.  The patient describes both episodic as well as semantic memory issues for newly learned/experienced information.   The patient denies any family history of progressive neurological disorders later in life.   The patient describes his sleep as being "pretty good" but his wife highlights the issue of how bad his snoring is at night but she was unsure of times where he may stop breathing.  Patient's wife does put a lot of effort in making sure that he gets to his appointments and remembers things.  They describe a slow progression with seemingly abrupt changes after various orthopedic surgeries.  Patient denies significant depression or other mood based disturbance and there is been no particular change in behavior or personality styles.  The patient has  had some episodes of significant stress creating anxiety and depressive type symptoms.  The patient describes these as primarily psychosocial in nature and there was a very stressful epic of time around COVID where he and his wife are trying to take care of 2 individuals and it became a giant stressor for them.  This is no longer an issue and the patient reports that his mood is better, although he does have times where he gets relatively stressed and agitated.  Below you will find issues related to past medical history.   The the patient was diagnosed with obstructive sleep apnea roughly 25 years ago and had a brief stent with CPAP that was discontinued.  The patient has had a more recent sleep study after episode of dysarthria that was inconclusive.  Patient reports that he simply did not sleep during that study.  I have strongly encouraged the patient to follow-up with that and do everything we can to get a more valid sleep study conducted.   The patient does note that he has had some changes in coordination and weakness.  Patient notes that he tends to drop a lot of things and has been stumbling more than typical.  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  This was unclear and just hypothesize over some other etiological factors.   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   The patient is taking both Aricept and Namenda without apparent side effects, although he is having more vivid dreams that are at least considered to be  side effects or potential side effects of Aricept.  The patient took Cymbalta back in 2021 during periods of anxiety and depression with significant psychosocial stressors.     Impression/Diagnosis:   The results of the current neuropsychological evaluation are quite encouraging.  While I suspect that there is some general cognitive decline relative to premorbid functioning levels there was no pattern of deficits noted outside of a general loss and overall cognitive functioning relative to estimates of premorbid functioning.  These were very mild in nature and all cognitive domains assessed were in the average range relative to a normative population.  I suspect that there were some or most areas of functioning in the upper end of the average to high average range relative to a normative population premorbidly so this may reflect some mild or subtle changes but not enough to explain the patient and his wife's description of significant changes in cognitive functioning and in particular weaknesses in memory and learning.  Visual-spatial and visual processing functioning were consistent with other cognitive domains and his general information processing speed was also consistent.  Average auditory and verbal encoding was noted with consistent performance on memory and learning domains to his encoding capacity.  His memory was slightly below predicted levels of premorbid functioning and slightly below predicted levels of functioning based on predictive model that utilized his verbal based skills to estimate his actual memory and learning.  However, these again were only slightly below predicted levels and not indicative of significant memory and learning deficits.  As far as diagnostic considerations, the patient's pattern of cognitive strengths and weaknesses looking at within test performance as well as comparisons to estimates of premorbid functioning are not consistent with any type of pattern seen with  progressive degenerative neurological conditions.  The pattern is not consistent with those typically seen with Alzheimer's, Lewy body or other cortical type dementia's and not consistent with patterns seen with various subcortical major neurocognitive disorders.  During the initial clinical interview I had some concerns about the patient's potential for significant sleep apnea and the patient had already been encouraged to do a follow-up sleep study.  The patient was diagnosed with obstructive sleep apnea many years ago and only briefly used his CPAP machine.  Since my initial clinical visit with the patient he has had repeat sleep study performed which identified severe obstructive sleep apnea that was not REM accentuated as well as severe hypoxemia although the degree of hypoxemia was at least potentially considered to be an artifact although it was clearly of great concern.  The level of untreated obstructive sleep apnea the patient displayed would clearly be sufficient to describe and explain the descriptions of the patient and his  wife regarding the patient having various cognitive changes and memory and learning and acute episodes of confusion etc.  Even the patient's rare visual hallucinations could be accounted for by severe sleep disturbance.  The patient has also had significant stressors with anxiety and depressive symptoms during heightened psychosocial stressors starting around the time of described cognitive changes.  The patient has been started on both Aricept and Namenda without apparent side effect although he describes more vivid dreams at times it could be reflexive of his sleep disturbance and/or potential mild side effect of the Aricept.  In any event, the current objective assessment is not consistent with a Alzheimer's type major neurocognitive disorder.  However, as the patient may have premorbidly been functioning higher than estimates this cannot be completely ruled out at this time  although it is not particularly likely.  The patient is strongly encouraged to follow-up diligently with his CPAP device once it is titrated and use it every night as well as during the day if he takes any naps.  The patient should not attempt to sleep without his CPAP device.  Given the various stressors that the patient has been through I would also suggest that a consideration of restarting an SSRI medication again.  The patient took Cymbalta in the past during some of these acute psychosocial stressors and it would not be an unwise decision to attempt to restart Cymbalta or try some type of SSRI to help with mood.  We will schedule the patient to return for repeat testing in approximately 9 to 12 months to make direct comparisons to his current cognitive functioning.  This will allow for an extended period of time of regular CPAP use to see if his performances improve, which I suspect they will.  I will sit down with the patient and his wife and go over the results of the current neuropsychological evaluation and going to greater individual recommendations that are outside of specific medical recommendations.  Diagnosis:    Memory loss  Mild cognitive disorder  Chronic intermittent hypoxia with obstructive sleep apnea   _____________________ Arley Phenix, Psy.D. Clinical Neuropsychologist

## 2022-12-20 NOTE — Telephone Encounter (Signed)
I called pt. I advised pt that Dr. Vickey Huger reviewed their sleep study results and found that pt was best tolerated with BiPAP. Dr. Vickey Huger recommends that pt starts BiPAP. I reviewed PAP compliance expectations with the pt. Pt is agreeable to starting a BiPAP. I advised pt that an order will be sent to a DME, [facility], and [facility] will call the pt within about one week after they file with the pt's insurance. [facility] will show the pt how to use the machine, fit for masks, and troubleshoot the BiPAP if needed. A follow up appt is already scheduled with Margie Ege for 03/10/2023 for a 6 month follow up. I advised the patient as long as he starts the BiPAP by 01/07/2023 he should be ok to use this appt as the initial BiPAP appointment for insurance purposes. Pt verbalized understanding to arrive 15 minutes early and bring their CPAP. Pt verbalized understanding of results. Pt had no questions at this time but was encouraged to call back if questions arise. I have sent the order to [facility] and have received confirmation that they have received the order.

## 2022-12-21 LAB — BASIC METABOLIC PANEL
BUN/Creatinine Ratio: 23 (ref 10–24)
BUN: 26 mg/dL (ref 8–27)
CO2: 22 mmol/L (ref 20–29)
Calcium: 9.2 mg/dL (ref 8.6–10.2)
Chloride: 103 mmol/L (ref 96–106)
Creatinine, Ser: 1.11 mg/dL (ref 0.76–1.27)
Glucose: 68 mg/dL — ABNORMAL LOW (ref 70–99)
Potassium: 4.8 mmol/L (ref 3.5–5.2)
Sodium: 142 mmol/L (ref 134–144)
eGFR: 69 mL/min/{1.73_m2} (ref >59)

## 2023-01-05 DIAGNOSIS — G4700 Insomnia, unspecified: Secondary | ICD-10-CM | POA: Diagnosis not present

## 2023-01-05 DIAGNOSIS — R0609 Other forms of dyspnea: Secondary | ICD-10-CM | POA: Diagnosis not present

## 2023-01-05 DIAGNOSIS — J329 Chronic sinusitis, unspecified: Secondary | ICD-10-CM | POA: Diagnosis not present

## 2023-01-05 DIAGNOSIS — J40 Bronchitis, not specified as acute or chronic: Secondary | ICD-10-CM | POA: Diagnosis not present

## 2023-01-21 DIAGNOSIS — Z79899 Other long term (current) drug therapy: Secondary | ICD-10-CM | POA: Diagnosis not present

## 2023-01-21 DIAGNOSIS — E669 Obesity, unspecified: Secondary | ICD-10-CM | POA: Diagnosis not present

## 2023-01-21 DIAGNOSIS — M0609 Rheumatoid arthritis without rheumatoid factor, multiple sites: Secondary | ICD-10-CM | POA: Diagnosis not present

## 2023-01-21 DIAGNOSIS — Z6838 Body mass index (BMI) 38.0-38.9, adult: Secondary | ICD-10-CM | POA: Diagnosis not present

## 2023-01-21 DIAGNOSIS — M1991 Primary osteoarthritis, unspecified site: Secondary | ICD-10-CM | POA: Diagnosis not present

## 2023-01-21 DIAGNOSIS — G4733 Obstructive sleep apnea (adult) (pediatric): Secondary | ICD-10-CM | POA: Diagnosis not present

## 2023-01-24 DIAGNOSIS — M0609 Rheumatoid arthritis without rheumatoid factor, multiple sites: Secondary | ICD-10-CM | POA: Diagnosis not present

## 2023-01-24 DIAGNOSIS — Z79899 Other long term (current) drug therapy: Secondary | ICD-10-CM | POA: Diagnosis not present

## 2023-02-07 ENCOUNTER — Encounter: Payer: Self-pay | Admitting: Cardiology

## 2023-02-08 ENCOUNTER — Encounter: Payer: Self-pay | Admitting: Neurology

## 2023-02-08 ENCOUNTER — Encounter: Payer: Self-pay | Admitting: Cardiology

## 2023-02-08 ENCOUNTER — Ambulatory Visit (INDEPENDENT_AMBULATORY_CARE_PROVIDER_SITE_OTHER): Payer: Medicare Other | Admitting: Neurology

## 2023-02-08 ENCOUNTER — Ambulatory Visit: Payer: Medicare Other | Attending: Cardiology | Admitting: Cardiology

## 2023-02-08 VITALS — BP 142/88 | HR 54 | Ht 74.0 in | Wt 298.2 lb

## 2023-02-08 VITALS — BP 155/85 | HR 57 | Ht 74.0 in | Wt 296.8 lb

## 2023-02-08 DIAGNOSIS — I483 Typical atrial flutter: Secondary | ICD-10-CM | POA: Insufficient documentation

## 2023-02-08 DIAGNOSIS — I10 Essential (primary) hypertension: Secondary | ICD-10-CM | POA: Diagnosis not present

## 2023-02-08 DIAGNOSIS — F09 Unspecified mental disorder due to known physiological condition: Secondary | ICD-10-CM | POA: Diagnosis not present

## 2023-02-08 DIAGNOSIS — I7121 Aneurysm of the ascending aorta, without rupture: Secondary | ICD-10-CM | POA: Diagnosis not present

## 2023-02-08 DIAGNOSIS — G4733 Obstructive sleep apnea (adult) (pediatric): Secondary | ICD-10-CM

## 2023-02-08 DIAGNOSIS — E785 Hyperlipidemia, unspecified: Secondary | ICD-10-CM | POA: Insufficient documentation

## 2023-02-08 DIAGNOSIS — Z23 Encounter for immunization: Secondary | ICD-10-CM | POA: Diagnosis not present

## 2023-02-08 MED ORDER — SPIRONOLACTONE 25 MG PO TABS
12.5000 mg | ORAL_TABLET | Freq: Every day | ORAL | 3 refills | Status: DC
Start: 2023-02-08 — End: 2023-09-13

## 2023-02-08 NOTE — Progress Notes (Signed)
PATIENT: Brett Rosales DOB: [birth year]  REASON FOR VISIT: Follow up HISTORY FROM: Patient, wife PRIMARY NEUROLOGIST: Dr. Terrace Arabia, Dr. Vickey Huger for sleep consult  ASSESSMENT AND PLAN 77 y.o. 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  Hypoxia.  Titration study found BiPAP 8/14 cm worked best)  -Very good benefit with BiPAP.  Compliance is superb.  Will continue BiPAP current settings.  His mask is leaking at 44, but he just got a new mask within the last few weeks, will continue to adjust.  Recommend nightly usage for minimum of 4 hours. Reports dreams have resolved, wife reports restful/calm sleep -He stopped Aricept and Namenda.  MoCA today 21/30 (prior 25/30).  Subjectively has not noted much change in memory -Neuropsychological testing was not consistent with any pattern of a progressive degenerative neurological condition.  Felt some of the sleep study/ unusual sleep patterns could not completely rule out early stages of Lewy body type process.  Recommend reevaluation in 9 to 12 months. -MRI of the brain in July 2022 showed no acute abnormality, mild age-related atrophy SVD, moderate paranasal sinus disease -B12, TSH, RPR were normal -Follow-up in 8-12 months  HISTORY  07/29/2020 Dr. Terrace Arabia: Brett Rosales, is a 77 year old male, seen in request by his primary care physician Dr. Leonor Liv, [facility] for evaluation of memory loss, excessive fatigue, he is accompanied by his wife at today's visit on Jul 30, 2020   I reviewed and summarized the referring note. PMHX. 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, has multiple degenerative joint disease, chronic low back pain, has been treated as rheumatoid arthritis for  many years, use different medications, including Remicade, methotrexate, also had a history of atrial fibrillation, on chronic Xarelto   He was noted to have gradual onset of memory loss since his multiple joint replacement surgery in 2020, he had left hip replacement in 2020, left knee replacement in summer 2021, and right knee replacement in November 2021   Following all those general anesthesia, he was noted to have some changes, he often forgets to clean himself up after project, he used to be very good with direction, now often misses a turn when driving,   There was no family history of dementia, he was put on Aricept 5 mg few months ago, while he was taking every night, he complains of excessive dreams, improved after move the dosage to every morning, he is independent in his daily activity, MoCA examination is 23/30 today   Because of his multiple joints pain, he did not exercise regularly, did have weight gain, used to have loud snoring, sometimes up in the middle of the night catching his breath  Update February 11, 2021 SS: Had rotator cuff surgery 3 weeks ago, on the left arm. Saw Dr. Vickey Huger to have sleep study, didn't schedule yet.  B12, RPR, TSH were normal May 2022.  MRI of the brain in July 2022 showed no acute abnormality, mild age-related atrophy SVD, moderate paranasal sinus disease  Taking Aricept 10 mg AM, noticed improvement reported by his wife, no more side effects. He is remembering things better, not asking as much repetitive questioning. Is driving, not getting lost. In stressful situations, can't process what he needs to do as  quickly as before, will start stuttering, gets frustrated. They are building a house which is stressful.   MOCA today was 28/30.  Update August 13, 2021 SS: [facility] 24/30, here with his wife, worsening memory loss, forgets household chores/tasks, stress from building a home, he did the hot Insurance underwriter, electrical wiring. Drives a car  okay for now. Remains on Aricept 10 mg daily, his wife manages pill box. Never had sleep test, claims snoring is less. Taken 2 sleep tests in the past, was told he had sleep apnea, but data wasn't good, never came for in lab. Gets flustered with questioning. Takes Ritalin. Feels more irritable. Has lost 40 lbs in the last year, with increasing activity.  Update February 16, 2022: MOCA 25/30. Started namenda 10 mg BID last visit, remains on Aricept 10 mg daily. He feels memory is better. Staying on task better. He is managing building a new home, he is doing a good job. He is putting in cabinets, electrical work. Drives a car, no issues. No changes in behavioral, wife thinks is normal. Not interested in sleep study. Snores less.   Update August 18, 2022 SS: Last visit was referred to neuropsych, didn't have it, scheduled for next week with Dr. Kieth Brightly.  Has continued on Namenda 10 mg BID, Aricept 10 mg daily. Today MOCA 25/30. He feels memory is better with change in season, feels mind is clearer and sharper with the longer daytime hours. He drives well. Wife has not noted any worsening. His house will be built in the summer, he has done most of the work. He does have trouble with measurements, may cut it short. Has vivid dreams, still takes Aricept in the AM, will last for few nights then go away. Not interested in sleep study, still snores. He makes jokes.   Update February 08, 2023 SS: Had HST 11/11/2022, confirmed severe sleep apnea along with severe hypoxia.  CPAP titration 12/06/2022, complex apnea responded to BiPAP 18/14 cmH2O under FFM.  Had neuropsychological evaluation with Dr. Kieth Brightly in September 2024 not consistent with any type of progressive degenerative neurologic condition.  Plan to reevaluate in 9 to 12 months.  Started BiPAP 01/04/2023.  Download shows superb usage greater than 4 hours 97%, AHI 1.8, leak 43.5. He is sleeping better, he feels more rested. Wife notes his color is better,  unclear if memory improvement? He did leave his phone at home. He stopped Aricept and Namenda, weird dreams have resolved. ESS 1. His has less pain in the AM. Sleep is calmer, no longer restless, not running legs in bed. Uses FFM, under the nose, got new mask #2 few weeks ago, looks like less leak on data.  His wife denies any issues driving.  REVIEW OF SYSTEMS: Out of a complete 14 system review of symptoms, the patient complains only of the following symptoms, and all other reviewed systems are negative.  See HPI  ALLERGIES: Allergies  Allergen Reactions   Nsaids Other (See Comments)    "Eats hole in stomach".Marland KitchenMarland KitchenMarland KitchenIbuprofen, Meloxicam...  Other Reaction(s): GI Intolerance   Hydroxychloroquine Other (See Comments)   Ibuprofen     " Causes ulcers in my stomach."   Statins Other (See Comments)    Myalgias    HOME MEDICATIONS: Outpatient Medications Prior to Visit  Medication Sig Dispense Refill   albuterol (VENTOLIN HFA) 108 (90 Base) MCG/ACT inhaler Inhale 2 puffs into the lungs as needed for wheezing or shortness of breath.     amLODipine (NORVASC) 5 MG  tablet Take 5 mg by mouth daily.     B Complex Vitamins (VITAMIN B COMPLEX) TABS Take 1 tablet by mouth daily.     Cholecalciferol (VITAMIN D3 PO) Take 2,000 Units by mouth daily.     Docusate Sodium (DSS) 100 MG CAPS Take 100 mg by mouth in the morning and at bedtime.     DULoxetine (CYMBALTA) 30 MG capsule Take 30 mg by mouth daily.     hydrochlorothiazide (HYDRODIURIL) 25 MG tablet Take 1 tablet (25 mg total) by mouth daily. 90 tablet 1   inFLIXimab (REMICADE IV) Inject 1 vial into the vein See admin instructions. Every 6 weeks/ Unknown strenght     losartan (COZAAR) 100 MG tablet Take 100 mg by mouth daily.     methylphenidate (RITALIN) 20 MG tablet Take 20 mg by mouth daily.     metoprolol tartrate (LOPRESSOR) 25 MG tablet Take 1 tablet (25 mg total) by mouth 2 (two) times daily. 180 tablet 2   pravastatin (PRAVACHOL) 40 MG  tablet Take 1 tablet (40 mg total) by mouth every evening. 30 tablet 3   Red Yeast Rice Extract (RED YEAST RICE PO) Take 2 capsules by mouth daily.     rivaroxaban (XARELTO) 20 MG TABS tablet TAKE 1 TABLET BY MOUTH DAILY WITH SUPPER 90 tablet 1   spironolactone (ALDACTONE) 25 MG tablet Take 0.5 tablets (12.5 mg total) by mouth daily. 45 tablet 3   traZODone (DESYREL) 50 MG tablet Take 50 mg by mouth at bedtime as needed for sleep.     No facility-administered medications prior to visit.    PAST MEDICAL HISTORY: Past Medical History:  Diagnosis Date   Acute foreign body of ear canal 11/12/2015   Acute pain of left wrist 12/02/2022   Anxiety    medication made him feel flat stopped meds 09/07/2018   Arthritis    Ascending aortic aneurysm (HCC) 12/23/2017   Bilateral chronic knee pain 08/14/2019   BMI 39.0-39.9,adult 06/03/2020   Complication of anesthesia    Knee block didn't take in 7/21 lt knee   Depression    Dyslipidemia 01/23/2019   Dyspnea on exertion 12/09/2017   Dysrhythmia 11/2017   a flutter   Essential hypertension 12/09/2017   History of kidney stones    13 stones   Hypertension    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 it was inconclusive. needs follow up   Snoring 08/13/2021   Supraventricular tachycardia (HCC) 12/09/2017   Swelling of left lower extremity 05/17/2018   Typical atrial flutter (HCC) 12/16/2017    PAST SURGICAL HISTORY: Past Surgical History:  Procedure Laterality Date   TONSILLECTOMY AND ADENOIDECTOMY     TOTAL HIP ARTHROPLASTY Left  09/21/2018   Procedure: TOTAL HIP ARTHROPLASTY ANTERIOR APPROACH;  Surgeon: Samson Frederic, MD;  Location: [facility];  Service: Orthopedics;  Laterality: Left;   TOTAL KNEE ARTHROPLASTY Left 09/24/2019   Procedure: TOTAL KNEE ARTHROPLASTY;  Surgeon: Dannielle Huh, MD;  Location: [facility];  Service: Orthopedics;  Laterality: Left;   TOTAL KNEE ARTHROPLASTY Right 02/04/2020   Procedure: TOTAL KNEE ARTHROPLASTY;  Surgeon: Dannielle Huh, MD;  Location: [facility];  Service: Orthopedics;  Laterality: Right;   VASECTOMY  1990    FAMILY HISTORY: Family History  Problem Relation Age of Onset   Diabetes Mother    Cancer Mother    Hypertension Father    Diabetes Paternal Grandmother     SOCIAL HISTORY: Social History   Socioeconomic History   Marital status: Married    Spouse name: [name]   Number of children: 3   Years of education: Not on file   Highest education level: High school graduate  Occupational History   Not on file  Tobacco Use   Smoking status: Former    Current packs/day: 0.00    Average packs/day: 1 pack/day for 15.0 years (15.0 ttl pk-yrs)    Types: Cigarettes    Start date: 01/30/1971    Quit date: 01/29/1986    Years since quitting: 37.0   Smokeless tobacco: Never  Vaping Use   Vaping status: Never Used  Substance and Sexual Activity   Alcohol use: No    Alcohol/week: 0.0 standard drinks of alcohol   Drug use: No   Sexual activity: Yes    Birth control/protection: None  Other Topics Concern   Not on file  Social History Narrative   Lives with wife   Right handed   Caffeine: occa drinks coffee, no energy drink. Ginger tea   Social Determinants of Health   Financial Resource Strain: Not on file  Food Insecurity: Not on file  Transportation Needs: Not on file  Physical Activity: Not on file  Stress: Not on file  Social Connections: Not on file  Intimate Partner Violence: Not on file       02/08/2023    3:44 PM 08/18/2022   11:23 AM 02/16/2022    2:52 PM  08/13/2021    1:38 PM 02/11/2021    2:47 PM  Montreal Cognitive Assessment   Visuospatial/ Executive (0/5) 3 5 5 5 5   Naming (0/3) 1 3 2 3 3   Attention: Read list of digits (0/2) 2 1 2 2 2   Attention: Read list of letters (0/1) 1 1 1 1 1   Attention: Serial 7 subtraction starting at 100 (0/3) 1 2 2 3 3   Language: Repeat phrase (0/2) 2 2 2 1 2   Language : Fluency (0/1) 1 1 0 1 1  Abstraction (0/2) 2 2 2 2 2   Delayed Recall (0/5) 2 2 3 1 4   Orientation (0/6) 6 5 5 5 5   Total 21 24 24 24 28   Adjusted Score (based on education)  25 25     PHYSICAL EXAM  Vitals:   02/08/23 1516  BP: (!) 155/85  Pulse: (!) 57  Weight: 296 lb 12.8 oz (134.6 kg)  Height: 6\' 2"  (1.88 m)   Body mass index is 38.11 kg/m.  Generalized: Well developed, in no acute distress, very pleasant, makes jokes Neurological examination  Mentation: Alert oriented to time, place, history taking. Follows all commands speech and language fluent Cranial nerve II-XII: Pupils were equal round reactive to light. Extraocular movements were full, visual field were full on confrontational test. Facial sensation and strength were normal.  Motor: Good strength throughout Sensory: Sensory testing is intact to soft touch on all 4 extremities. No evidence of extinction is noted.  Coordination: Cerebellar testing reveals good finger-nose-finger bilaterally  Gait and station: Gait is normal , independent  DIAGNOSTIC DATA (LABS, IMAGING, TESTING) - I reviewed patient records, labs, notes, testing and imaging  myself where available.  Lab Results  Component Value Date   WBC 5.3 01/30/2020   HGB 14.1 01/30/2020   HCT 44.8 01/30/2020   MCV 95.5 01/30/2020   PLT 247 01/30/2020      Component Value Date/Time   NA 142 12/20/2022 1533   K 4.8 12/20/2022 1533   CL 103 12/20/2022 1533   CO2 22 12/20/2022 1533   GLUCOSE 68 (L) 12/20/2022 1533   GLUCOSE 99 01/30/2020 1142   BUN 26 12/20/2022 1533   CREATININE 1.11 12/20/2022 1533    CALCIUM 9.2 12/20/2022 1533   PROT 6.9 01/30/2020 1142   ALBUMIN 3.9 01/30/2020 1142   AST 15 01/30/2020 1142   ALT 17 01/30/2020 1142   ALKPHOS 72 01/30/2020 1142   BILITOT 0.5 01/30/2020 1142   GFRNONAA >60 01/30/2020 1142   GFRAA >60 09/20/2019 1204   Lab Results  Component Value Date   CHOL 248 (H) 01/12/2022   HDL 116 01/12/2022   LDLCALC 121 (H) 01/12/2022   TRIG 67 01/12/2022   CHOLHDL 2.1 01/12/2022   No results found for: "HGBA1C" Lab Results  Component Value Date   VITAMINB12 360 07/29/2020   Lab Results  Component Value Date   TSH 2.320 07/29/2020   Margie Ege, AGNP-C, DNP 02/08/2023, 4:24 PM [facility] [address] [place], [place] [address] [phone]

## 2023-02-08 NOTE — Patient Instructions (Signed)
Medication Instructions:   START: Aldactone 12.5mg  1 tablet daily   Lab Work: Your physician recommends that you return for lab work in: 1 week You need to have labs done when you are fasting.  You can come Monday through Friday 8:30 am to 12:00 pm and 1:15 to 4:30. You do not need to make an appointment as the order has already been placed. The labs you are going to have done are BMET    Testing/Procedures: Your physician has requested that you have an echocardiogram. Echocardiography is a painless test that uses sound waves to create images of your heart. It provides your doctor with information about the size and shape of your heart and how well your heart's chambers and valves are working. This procedure takes approximately one hour. There are no restrictions for this procedure. Please do NOT wear cologne, perfume, aftershave, or lotions (deodorant is allowed). Please arrive 15 minutes prior to your appointment time.  Please note: We ask at that you not bring children with you during ultrasound (echo/ vascular) testing. Due to room size and safety concerns, children are not allowed in the ultrasound rooms during exams. Our front office staff cannot provide observation of children in our lobby area while testing is being conducted. An adult accompanying a patient to their appointment will only be allowed in the ultrasound room at the discretion of the ultrasound technician under special circumstances. We apologize for any inconvenience.    Follow-Up: At [facility], you and your health needs are our priority.  As part of our continuing mission to provide you with exceptional heart care, we have created designated Provider Care Teams.  These Care Teams include your primary Cardiologist (physician) and Advanced Practice Providers (APPs -  Physician Assistants and Nurse Practitioners) who all work together to provide you with the care you need, when you need it.  We recommend signing up for  the patient portal called "MyChart".  Sign up information is provided on this After Visit Summary.  MyChart is used to connect with patients for Virtual Visits (Telemedicine).  Patients are able to view lab/test results, encounter notes, upcoming appointments, etc.  Non-urgent messages can be sent to your provider as well.   To learn more about what you can do with MyChart, go to [email or web address].    Your next appointment:   6 month(s)  The format for your next appointment:   In Person  Provider:   Gypsy Balsam, MD    Other Instructions NA

## 2023-02-08 NOTE — Patient Instructions (Signed)
Continue BIPAP, nightly for minimum of 4 hours, continue current settings. Keep follow up with Dr. Kieth Brightly. Call for any worsening symptoms.

## 2023-02-08 NOTE — Addendum Note (Signed)
Addended by: Baldo Ash D on: 02/08/2023 10:59 AM   Modules accepted: Orders

## 2023-02-08 NOTE — Progress Notes (Signed)
Cardiology Office Note:    Date:  02/08/2023   ID:  Brett Rosales, DOB 1946/01/03, MRN [id number]  PCP:  Brett Handler, NP  Cardiologist:  Brett Balsam, MD    Referring MD: Brett Handler, NP   No chief complaint on file.   History of Present Illness:    Brett Rosales is a 77 y.o. male past medical history significant for paroxysmal atrial flutter, he is anticoagulated, ascending aortic enlargement measuring 43 to 44 mm, dyslipidemia, dyspnea on exertion.  Comes today to months for follow-up.  Overall doing very well.  Denies have any chest pain tightness squeezing pressure burning chest no palpitation dizziness swelling of lower extremities he said he is doing fine  Past Medical History:  Diagnosis Date   Acute foreign body of ear canal 11/12/2015   Acute pain of left wrist 12/02/2022   Anxiety    medication made him feel flat stopped meds 09/07/2018   Arthritis    Ascending aortic aneurysm (HCC) 12/23/2017   Bilateral chronic knee pain 08/14/2019   BMI 39.0-39.9,adult 06/03/2020   Complication of anesthesia    Knee block didn't take in 7/21 lt knee   Depression    Dyslipidemia 01/23/2019   Dyspnea on exertion 12/09/2017   Dysrhythmia 11/2017   a flutter   Essential hypertension 12/09/2017   History of kidney stones    13 stones   Hypertension    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 it was inconclusive. needs follow up   Snoring 08/13/2021   Supraventricular tachycardia (HCC) 12/09/2017   Swelling of left lower extremity 05/17/2018   Typical atrial flutter (HCC)  12/16/2017    Past Surgical History:  Procedure Laterality Date   TONSILLECTOMY AND ADENOIDECTOMY     TOTAL HIP ARTHROPLASTY Left 09/21/2018   Procedure: TOTAL HIP ARTHROPLASTY ANTERIOR APPROACH;  Surgeon: Samson Frederic, MD;  Location: [facility];  Service: Orthopedics;  Laterality: Left;   TOTAL KNEE ARTHROPLASTY Left 09/24/2019   Procedure: TOTAL KNEE ARTHROPLASTY;  Surgeon: Dannielle Huh, MD;  Location: [facility];  Service: Orthopedics;  Laterality: Left;   TOTAL KNEE ARTHROPLASTY Right 02/04/2020   Procedure: TOTAL KNEE ARTHROPLASTY;  Surgeon: Dannielle Huh, MD;  Location: [facility];  Service: Orthopedics;  Laterality: Right;   VASECTOMY  1990    Current Medications: Current Meds  Medication Sig   albuterol (VENTOLIN HFA) 108 (90 Base) MCG/ACT inhaler Inhale 2 puffs into the lungs as needed for wheezing or shortness of breath.   amLODipine (NORVASC) 5 MG tablet Take 5 mg by mouth daily.   B Complex Vitamins (VITAMIN B COMPLEX) TABS Take 1 tablet by mouth daily.   Cholecalciferol (VITAMIN D3 PO) Take 2,000 Units by mouth daily.   Docusate Sodium (DSS) 100 MG CAPS Take 100 mg by mouth in the morning and at bedtime.   DULoxetine (CYMBALTA) 30 MG capsule Take 30 mg by mouth daily.   hydrochlorothiazide (HYDRODIURIL) 25 MG tablet Take 1 tablet (25 mg total) by mouth daily.   inFLIXimab (REMICADE IV) Inject 1 vial into the vein See admin instructions. Every  6 weeks/ Unknown strenght   losartan (COZAAR) 100 MG tablet Take 100 mg by mouth daily.   methylphenidate (RITALIN) 20 MG tablet Take 20 mg by mouth daily.   metoprolol tartrate (LOPRESSOR) 25 MG tablet Take 1 tablet (25 mg total) by mouth 2 (two) times daily.   pravastatin (PRAVACHOL) 40 MG tablet Take 1 tablet (40 mg total) by mouth every evening.   Red Yeast Rice Extract (RED YEAST RICE PO) Take 2 capsules by mouth daily.   rivaroxaban (XARELTO) 20 MG TABS tablet TAKE 1 TABLET BY MOUTH DAILY WITH SUPPER   traZODone (DESYREL) 50 MG tablet Take  50 mg by mouth at bedtime as needed for sleep.   [DISCONTINUED] rivaroxaban (XARELTO) 20 MG TABS tablet Take 1 tablet (20 mg total) by mouth daily with supper.     Allergies:   Nsaids, Hydroxychloroquine, Ibuprofen, and Statins   Social History   Socioeconomic History   Marital status: Married    Spouse name: [name]   Number of children: 3   Years of education: Not on file   Highest education level: High school graduate  Occupational History   Not on file  Tobacco Use   Smoking status: Former    Current packs/day: 0.00    Average packs/day: 1 pack/day for 15.0 years (15.0 ttl pk-yrs)    Types: Cigarettes    Start date: 01/30/1971    Quit date: 01/29/1986    Years since quitting: 37.0   Smokeless tobacco: Never  Vaping Use   Vaping status: Never Used  Substance and Sexual Activity   Alcohol use: No    Alcohol/week: 0.0 standard drinks of alcohol   Drug use: No   Sexual activity: Yes    Birth control/protection: None  Other Topics Concern   Not on file  Social History Narrative   Lives with wife   Right handed   Caffeine: occa drinks coffee, no energy drink. Ginger tea   Social Determinants of Corporate investment banker Strain: Not on file  Food Insecurity: Not on file  Transportation Needs: Not on file  Physical Activity: Not on file  Stress: Not on file  Social Connections: Not on file     Family History: The patient's family history includes Cancer in his mother; Diabetes in his mother and paternal grandmother; Hypertension in his father. ROS:   Please see the history of present illness.    All 14 point review of systems negative except as described per history of present illness  EKGs/Labs/Other Studies Reviewed:         Recent Labs: 12/20/2022: BUN 26; Creatinine, Ser 1.11; Potassium 4.8; Sodium 142  Recent Lipid Panel    Component Value Date/Time   CHOL 248 (H) 01/12/2022 1638   TRIG 67 01/12/2022 1638   HDL 116 01/12/2022 1638   CHOLHDL 2.1  01/12/2022 1638   LDLCALC 121 (H) 01/12/2022 1638    Physical Exam:    VS:  BP (!) 142/88   Pulse (!) 54   Ht 6\' 2"  (1.88 m)   Wt 298 lb 3.2 oz (135.3 kg)   SpO2 96%   BMI 38.29 kg/m     Wt Readings from Last 3 Encounters:  02/08/23 298 lb 3.2 oz (135.3 kg)  11/04/22 280 lb 3.2 oz (127.1 kg)  08/18/22 269 lb 8 oz (122.2 kg)     GEN:  Well nourished, well developed in no acute distress HEENT: Normal NECK: No JVD; No carotid bruits LYMPHATICS: No lymphadenopathy CARDIAC: RRR,  no murmurs, no rubs, no gallops RESPIRATORY:  Clear to auscultation without rales, wheezing or rhonchi  ABDOMEN: Soft, non-tender, non-distended MUSCULOSKELETAL:  No edema; No deformity  SKIN: Warm and dry LOWER EXTREMITIES: no swelling NEUROLOGIC:  Alert and oriented x 3 PSYCHIATRIC:  Normal affect   ASSESSMENT:    1. Aneurysm of ascending aorta without rupture (HCC)   2. Typical atrial flutter (HCC)   3. Essential hypertension   4. Dyslipidemia    PLAN:    In order of problems listed above:  Aneurysm of the ascending aortic, echocardiogram be done to recheck size of the aorta will continue rest of the medication. Paroxysmal atrial flutter denies have any palpitations.  Anticoagulant which I will continue. Essential hypertension always slightly elevated, will put him on Aldactone 12.5 daily Chem-7 next week Dyslipidemia I do have data from 2023 which show LDL of 121.  However his HDL is 116.  Will recheck his fasting lipid profile    Medication Adjustments/Labs and Tests Ordered: Current medicines are reviewed at length with the patient today.  Concerns regarding medicines are outlined above.  No orders of the defined types were placed in this encounter.  Medication changes: No orders of the defined types were placed in this encounter.   Signed, Georgeanna Lea, MD, [facility] 02/08/2023 10:50 AM    [facility]

## 2023-02-14 DIAGNOSIS — F988 Other specified behavioral and emotional disorders with onset usually occurring in childhood and adolescence: Secondary | ICD-10-CM | POA: Diagnosis not present

## 2023-02-14 DIAGNOSIS — Z23 Encounter for immunization: Secondary | ICD-10-CM | POA: Diagnosis not present

## 2023-02-14 DIAGNOSIS — Z0000 Encounter for general adult medical examination without abnormal findings: Secondary | ICD-10-CM | POA: Diagnosis not present

## 2023-02-14 DIAGNOSIS — I10 Essential (primary) hypertension: Secondary | ICD-10-CM | POA: Diagnosis not present

## 2023-02-14 DIAGNOSIS — G4700 Insomnia, unspecified: Secondary | ICD-10-CM | POA: Diagnosis not present

## 2023-02-14 DIAGNOSIS — Z79899 Other long term (current) drug therapy: Secondary | ICD-10-CM | POA: Diagnosis not present

## 2023-02-14 DIAGNOSIS — E7800 Pure hypercholesterolemia, unspecified: Secondary | ICD-10-CM | POA: Diagnosis not present

## 2023-02-14 DIAGNOSIS — Z131 Encounter for screening for diabetes mellitus: Secondary | ICD-10-CM | POA: Diagnosis not present

## 2023-02-17 DIAGNOSIS — Z96651 Presence of right artificial knee joint: Secondary | ICD-10-CM | POA: Diagnosis not present

## 2023-02-23 ENCOUNTER — Other Ambulatory Visit: Payer: Medicare Other

## 2023-03-10 ENCOUNTER — Ambulatory Visit: Payer: Medicare Other | Admitting: Neurology

## 2023-03-10 DIAGNOSIS — Z111 Encounter for screening for respiratory tuberculosis: Secondary | ICD-10-CM | POA: Diagnosis not present

## 2023-03-10 DIAGNOSIS — R5383 Other fatigue: Secondary | ICD-10-CM | POA: Diagnosis not present

## 2023-03-10 DIAGNOSIS — M0609 Rheumatoid arthritis without rheumatoid factor, multiple sites: Secondary | ICD-10-CM | POA: Diagnosis not present

## 2023-03-10 DIAGNOSIS — Z79899 Other long term (current) drug therapy: Secondary | ICD-10-CM | POA: Diagnosis not present

## 2023-03-11 DIAGNOSIS — R059 Cough, unspecified: Secondary | ICD-10-CM | POA: Diagnosis not present

## 2023-03-11 DIAGNOSIS — R0981 Nasal congestion: Secondary | ICD-10-CM | POA: Diagnosis not present

## 2023-03-11 DIAGNOSIS — R062 Wheezing: Secondary | ICD-10-CM | POA: Diagnosis not present

## 2023-03-24 ENCOUNTER — Ambulatory Visit: Payer: Medicare Other

## 2023-04-12 ENCOUNTER — Ambulatory Visit: Payer: Medicare Other | Attending: Cardiology

## 2023-04-12 DIAGNOSIS — I7121 Aneurysm of the ascending aorta, without rupture: Secondary | ICD-10-CM | POA: Insufficient documentation

## 2023-04-12 LAB — ECHOCARDIOGRAM COMPLETE: S' Lateral: 4.1 cm

## 2023-04-12 MED ORDER — PERFLUTREN LIPID MICROSPHERE
1.0000 mL | INTRAVENOUS | Status: AC | PRN
  Administered 2023-04-12: 9 mL via INTRAVENOUS

## 2023-04-15 ENCOUNTER — Telehealth: Payer: Self-pay | Admitting: Emergency Medicine

## 2023-04-15 NOTE — Telephone Encounter (Signed)
-----   Message from Ralene Burger sent at 04/14/2023  4:42 PM EST ----- Echocardiogram showed preserved left ventricle ejection fraction, moderate LVH, overall looks good, ascending aorta measuring 44 mm, medical therapy

## 2023-04-15 NOTE — Telephone Encounter (Signed)
 Results reviewed with pt as per Dr. Vanetta Shawl note.  Pt verbalized understanding and had no additional questions. Routed to PCP

## 2023-04-19 ENCOUNTER — Other Ambulatory Visit: Payer: Self-pay | Admitting: Cardiology

## 2023-04-21 DIAGNOSIS — M0609 Rheumatoid arthritis without rheumatoid factor, multiple sites: Secondary | ICD-10-CM | POA: Diagnosis not present

## 2023-05-16 DIAGNOSIS — F988 Other specified behavioral and emotional disorders with onset usually occurring in childhood and adolescence: Secondary | ICD-10-CM | POA: Diagnosis not present

## 2023-05-16 DIAGNOSIS — L989 Disorder of the skin and subcutaneous tissue, unspecified: Secondary | ICD-10-CM | POA: Diagnosis not present

## 2023-05-16 DIAGNOSIS — I10 Essential (primary) hypertension: Secondary | ICD-10-CM | POA: Diagnosis not present

## 2023-05-16 DIAGNOSIS — F329 Major depressive disorder, single episode, unspecified: Secondary | ICD-10-CM | POA: Diagnosis not present

## 2023-05-17 DIAGNOSIS — Z79899 Other long term (current) drug therapy: Secondary | ICD-10-CM | POA: Diagnosis not present

## 2023-05-17 DIAGNOSIS — E669 Obesity, unspecified: Secondary | ICD-10-CM | POA: Diagnosis not present

## 2023-05-17 DIAGNOSIS — Z6839 Body mass index (BMI) 39.0-39.9, adult: Secondary | ICD-10-CM | POA: Diagnosis not present

## 2023-05-17 DIAGNOSIS — M0609 Rheumatoid arthritis without rheumatoid factor, multiple sites: Secondary | ICD-10-CM | POA: Diagnosis not present

## 2023-05-17 DIAGNOSIS — M1991 Primary osteoarthritis, unspecified site: Secondary | ICD-10-CM | POA: Diagnosis not present

## 2023-05-19 DIAGNOSIS — H26492 Other secondary cataract, left eye: Secondary | ICD-10-CM | POA: Diagnosis not present

## 2023-05-19 DIAGNOSIS — H43391 Other vitreous opacities, right eye: Secondary | ICD-10-CM | POA: Diagnosis not present

## 2023-05-19 DIAGNOSIS — H43392 Other vitreous opacities, left eye: Secondary | ICD-10-CM | POA: Diagnosis not present

## 2023-05-19 DIAGNOSIS — H26491 Other secondary cataract, right eye: Secondary | ICD-10-CM | POA: Diagnosis not present

## 2023-05-19 DIAGNOSIS — H4321 Crystalline deposits in vitreous body, right eye: Secondary | ICD-10-CM | POA: Diagnosis not present

## 2023-05-19 DIAGNOSIS — H4322 Crystalline deposits in vitreous body, left eye: Secondary | ICD-10-CM | POA: Diagnosis not present

## 2023-05-25 ENCOUNTER — Encounter: Payer: Self-pay | Admitting: Neurology

## 2023-06-02 DIAGNOSIS — M0609 Rheumatoid arthritis without rheumatoid factor, multiple sites: Secondary | ICD-10-CM | POA: Diagnosis not present

## 2023-06-15 DIAGNOSIS — D692 Other nonthrombocytopenic purpura: Secondary | ICD-10-CM | POA: Diagnosis not present

## 2023-06-15 DIAGNOSIS — I788 Other diseases of capillaries: Secondary | ICD-10-CM | POA: Diagnosis not present

## 2023-06-15 DIAGNOSIS — L814 Other melanin hyperpigmentation: Secondary | ICD-10-CM | POA: Diagnosis not present

## 2023-06-15 DIAGNOSIS — D485 Neoplasm of uncertain behavior of skin: Secondary | ICD-10-CM | POA: Diagnosis not present

## 2023-06-15 DIAGNOSIS — D225 Melanocytic nevi of trunk: Secondary | ICD-10-CM | POA: Diagnosis not present

## 2023-06-15 DIAGNOSIS — Z7189 Other specified counseling: Secondary | ICD-10-CM | POA: Diagnosis not present

## 2023-06-15 DIAGNOSIS — L821 Other seborrheic keratosis: Secondary | ICD-10-CM | POA: Diagnosis not present

## 2023-06-15 DIAGNOSIS — L2989 Other pruritus: Secondary | ICD-10-CM | POA: Diagnosis not present

## 2023-06-17 ENCOUNTER — Telehealth: Payer: Self-pay | Admitting: Cardiology

## 2023-06-17 DIAGNOSIS — I4892 Unspecified atrial flutter: Secondary | ICD-10-CM

## 2023-06-17 MED ORDER — RIVAROXABAN 20 MG PO TABS: 20.0000 mg | ORAL_TABLET | Freq: Every day | ORAL | 1 refills | Status: DC

## 2023-06-17 NOTE — Telephone Encounter (Signed)
?*  STAT* If patient is at the pharmacy, call can be transferred to refill team. ? ? ?1. Which medications need to be refilled? (please list name of each medication and dose if known)   ? rivaroxaban (XARELTO) 20 MG TABS tablet  ? ? ?2. Which pharmacy/location (including street and city if local pharmacy) is medication to be sent to? [facility], [place] ? ?3. Do they need a 30 day or 90 day supply?  ?90 day ? ?

## 2023-06-17 NOTE — Telephone Encounter (Signed)
 Xarelto 20mg  refill request received. Pt is 78 years old, weight-134.6kg, Crea-1.11 on 12/20/22, last seen by Dr. Bing Matter on 02/08/23, Diagnosis-Aflutter, CrCl-109.47 mL/min; Dose is appropriate based on dosing criteria. Will send in refill to requested pharmacy.

## 2023-06-21 ENCOUNTER — Other Ambulatory Visit: Payer: Self-pay | Admitting: Cardiology

## 2023-06-21 DIAGNOSIS — I4892 Unspecified atrial flutter: Secondary | ICD-10-CM

## 2023-07-12 ENCOUNTER — Ambulatory Visit: Payer: Medicare Other | Admitting: Psychology

## 2023-07-14 DIAGNOSIS — M0609 Rheumatoid arthritis without rheumatoid factor, multiple sites: Secondary | ICD-10-CM | POA: Diagnosis not present

## 2023-07-25 ENCOUNTER — Ambulatory Visit: Payer: Medicare Other | Admitting: Psychology

## 2023-08-09 DIAGNOSIS — R5382 Chronic fatigue, unspecified: Secondary | ICD-10-CM | POA: Insufficient documentation

## 2023-08-09 DIAGNOSIS — M7989 Other specified soft tissue disorders: Secondary | ICD-10-CM | POA: Insufficient documentation

## 2023-08-09 DIAGNOSIS — R202 Paresthesia of skin: Secondary | ICD-10-CM

## 2023-08-09 DIAGNOSIS — M1991 Primary osteoarthritis, unspecified site: Secondary | ICD-10-CM

## 2023-08-09 DIAGNOSIS — Z79899 Other long term (current) drug therapy: Secondary | ICD-10-CM | POA: Insufficient documentation

## 2023-08-09 HISTORY — DX: Primary osteoarthritis, unspecified site: M19.91

## 2023-08-09 HISTORY — DX: Other specified soft tissue disorders: M79.89

## 2023-08-09 HISTORY — DX: Chronic fatigue, unspecified: R53.82

## 2023-08-09 HISTORY — DX: Paresthesia of skin: R20.2

## 2023-08-09 HISTORY — DX: Other long term (current) drug therapy: Z79.899

## 2023-08-10 ENCOUNTER — Encounter: Payer: Self-pay | Admitting: Cardiology

## 2023-08-10 ENCOUNTER — Ambulatory Visit: Attending: Cardiology | Admitting: Cardiology

## 2023-08-10 VITALS — BP 140/84 | HR 65 | Ht 74.0 in | Wt 301.4 lb

## 2023-08-10 DIAGNOSIS — I7121 Aneurysm of the ascending aorta, without rupture: Secondary | ICD-10-CM | POA: Diagnosis not present

## 2023-08-10 DIAGNOSIS — I483 Typical atrial flutter: Secondary | ICD-10-CM | POA: Diagnosis not present

## 2023-08-10 DIAGNOSIS — R0609 Other forms of dyspnea: Secondary | ICD-10-CM | POA: Diagnosis not present

## 2023-08-10 DIAGNOSIS — E785 Hyperlipidemia, unspecified: Secondary | ICD-10-CM | POA: Insufficient documentation

## 2023-08-10 DIAGNOSIS — I10 Essential (primary) hypertension: Secondary | ICD-10-CM | POA: Diagnosis not present

## 2023-08-10 NOTE — Patient Instructions (Signed)
 Medication Instructions:  Your physician recommends that you continue on your current medications as directed. Please refer to the Current Medication list given to you today.  *If you need a refill on your cardiac medications before your next appointment, please call your pharmacy*   Lab Work: BMP, ProBNP- today If you have labs (blood work) drawn today and your tests are completely normal, you will receive your results only by: MyChart Message (if you have MyChart) OR A paper copy in the mail If you have any lab test that is abnormal or we need to change your treatment, we will call you to review the results.   Testing/Procedures: None Ordered   Follow-Up: At [facility], you and your health needs are our priority.  As part of our continuing mission to provide you with exceptional heart care, we have created designated Provider Care Teams.  These Care Teams include your primary Cardiologist (physician) and Advanced Practice Providers (APPs -  Physician Assistants and Nurse Practitioners) who all work together to provide you with the care you need, when you need it.  We recommend signing up for the patient portal called "MyChart".  Sign up information is provided on this After Visit Summary.  MyChart is used to connect with patients for Virtual Visits (Telemedicine).  Patients are able to view lab/test results, encounter notes, upcoming appointments, etc.  Non-urgent messages can be sent to your provider as well.   To learn more about what you can do with MyChart, go to [email or web address].    Your next appointment:   3 month(s)  The format for your next appointment:   In Person  Provider:   Ralene Burger, MD    Other Instructions Ref to pulmonology- they will call for appt-

## 2023-08-10 NOTE — Progress Notes (Signed)
 Cardiology Office Note:    Date:  08/10/2023   ID:  Brett Rosales, DOB [birth year], MRN [id number]  PCP:  Trudi Fus, NP  Cardiologist:  Ralene Burger, MD    Referring MD: Trudi Fus, NP   Chief Complaint  Patient presents with   Shortness of Breath   Dizziness    History of Present Illness:    Brett Rosales is a 78 y.o. male past medical history significant for paroxysmal atrial flutter maintained sinus rhythm anticoagulated, ascending aortic aneurysm 43 mm, dyslipidemia, dyspnea on exertion.  Comes today 2 months for follow-up.  Complain of having shortness of breath couple days ago he was trying to clean some limbs on his property became severely short of breath.  No chest pain no palpitation no tightness squeezing pressure burning chest  Past Medical History:  Diagnosis Date   Acute foreign body of ear canal 11/12/2015   Acute pain of left wrist 12/02/2022   Anxiety    medication made him feel flat stopped meds 09/07/2018   Arthritis    Ascending aortic aneurysm (HCC) 12/23/2017   Bilateral chronic knee pain 08/14/2019   BMI 39.0-39.9,adult 06/03/2020   Complication of anesthesia    Knee block didn't take in 7/21 lt knee   Depression    Dyslipidemia 01/23/2019   Dyspnea on exertion 12/09/2017   Dysrhythmia 11/2017   a flutter   Essential hypertension 12/09/2017   History of kidney stones    13 stones   Hypertension    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 it was inconclusive. needs follow up   Snoring 08/13/2021   Supraventricular tachycardia (HCC)  12/09/2017   Swelling of left lower extremity 05/17/2018   Typical atrial flutter (HCC) 12/16/2017    Past Surgical History:  Procedure Laterality Date   TONSILLECTOMY AND ADENOIDECTOMY     TOTAL HIP ARTHROPLASTY Left 09/21/2018   Procedure: TOTAL HIP ARTHROPLASTY ANTERIOR APPROACH;  Surgeon: Adonica Hoose, MD;  Location: [facility];  Service: Orthopedics;  Laterality: Left;   TOTAL KNEE ARTHROPLASTY Left 09/24/2019   Procedure: TOTAL KNEE ARTHROPLASTY;  Surgeon: Christie Cox, MD;  Location: [facility];  Service: Orthopedics;  Laterality: Left;   TOTAL KNEE ARTHROPLASTY Right 02/04/2020   Procedure: TOTAL KNEE ARTHROPLASTY;  Surgeon: Christie Cox, MD;  Location: [facility];  Service: Orthopedics;  Laterality: Right;   VASECTOMY  1990    Current Medications: Current Meds  Medication Sig   albuterol (VENTOLIN HFA) 108 (90 Base) MCG/ACT inhaler Inhale 2 puffs into the lungs as needed for wheezing or shortness of breath.   amLODipine  (NORVASC ) 5 MG tablet Take 5 mg by mouth daily.   B Complex Vitamins (VITAMIN B COMPLEX) TABS Take 1 tablet by mouth daily.   Cholecalciferol (VITAMIN D3 PO) Take 2,000 Units by mouth daily.   DULoxetine (CYMBALTA) 30 MG capsule Take 30 mg by mouth daily.   hydrochlorothiazide  (HYDRODIURIL ) 25 MG tablet TAKE 1 TABLET DAILY   inFLIXimab  (REMICADE  IV) Inject 1 vial into the vein See admin instructions. Every 6 weeks/ Unknown strenght   losartan  (COZAAR )  100 MG tablet Take 100 mg by mouth daily.   methylphenidate  (RITALIN ) 20 MG tablet Take 20 mg by mouth daily.   metoprolol  tartrate (LOPRESSOR ) 25 MG tablet Take 1 tablet (25 mg total) by mouth 2 (two) times daily.   pravastatin  (PRAVACHOL ) 40 MG tablet Take 1 tablet (40 mg total) by mouth every evening.   spironolactone  (ALDACTONE ) 25 MG tablet Take 0.5 tablets (12.5 mg total) by mouth daily.   traZODone (DESYREL) 50 MG tablet Take 50 mg by mouth at bedtime as needed for sleep.   XARELTO  20 MG TABS tablet TAKE 1 TABLET BY  MOUTH DAILY WITH SUPPER   [DISCONTINUED] Docusate Sodium  (DSS) 100 MG CAPS Take 100 mg by mouth in the morning and at bedtime.   [DISCONTINUED] Red Yeast Rice Extract (RED YEAST RICE PO) Take 2 capsules by mouth daily.     Allergies:   Nsaids, Hydroxychloroquine, Ibuprofen, and Statins   Social History   Socioeconomic History   Marital status: Married    Spouse name: [name]   Number of children: 3   Years of education: Not on file   Highest education level: High school graduate  Occupational History   Not on file  Tobacco Use   Smoking status: Former    Current packs/day: 0.00    Average packs/day: 1 pack/day for 15.0 years (15.0 ttl pk-yrs)    Types: Cigarettes    Start date: 01/30/1971    Quit date: 01/29/1986    Years since quitting: 37.5   Smokeless tobacco: Never  Vaping Use   Vaping status: Never Used  Substance and Sexual Activity   Alcohol use: No    Alcohol/week: 0.0 standard drinks of alcohol   Drug use: No   Sexual activity: Yes    Birth control/protection: None  Other Topics Concern   Not on file  Social History Narrative   Lives with wife   Right handed   Caffeine: occa drinks coffee, no energy drink. Ginger tea   Social Drivers of Corporate investment banker Strain: Not on file  Food Insecurity: Not on file  Transportation Needs: Not on file  Physical Activity: Not on file  Stress: Not on file  Social Connections: Not on file     Family History: The patient's family history includes Cancer in his mother; Diabetes in his mother and paternal grandmother; Hypertension in his father. ROS:   Please see the history of present illness.    All 14 point review of systems negative except as described per history of present illness  EKGs/Labs/Other Studies Reviewed:         Recent Labs: 12/20/2022: BUN 26; Creatinine, Ser 1.11; Potassium 4.8; Sodium 142  Recent Lipid Panel    Component Value Date/Time   CHOL 248 (H) 01/12/2022 1638   TRIG 67  01/12/2022 1638   HDL 116 01/12/2022 1638   CHOLHDL 2.1 01/12/2022 1638   LDLCALC 121 (H) 01/12/2022 1638    Physical Exam:    VS:  BP (!) 140/84 (BP Location: Right Arm, Patient Position: Sitting)   Pulse 65   Ht 6\' 2"  (1.88 m)   Wt (!) 301 lb 6.4 oz (136.7 kg)   SpO2 95%   BMI 38.70 kg/m     Wt Readings from Last 3 Encounters:  08/10/23 (!) 301 lb 6.4 oz (136.7 kg)  02/08/23 296 lb 12.8 oz (134.6 kg)  02/08/23 298 lb 3.2 oz (135.3 kg)     GEN:  Well nourished, well developed in  no acute distress HEENT: Normal NECK: No JVD; No carotid bruits LYMPHATICS: No lymphadenopathy CARDIAC: RRR, no murmurs, no rubs, no gallops RESPIRATORY:  Clear to auscultation without rales, wheezing or rhonchi  ABDOMEN: Soft, non-tender, non-distended MUSCULOSKELETAL:  No edema; No deformity  SKIN: Warm and dry LOWER EXTREMITIES: no swelling NEUROLOGIC:  Alert and oriented x 3 PSYCHIATRIC:  Normal affect   ASSESSMENT:    1. Aneurysm of ascending aorta without rupture (HCC)   2. Essential hypertension   3. Typical atrial flutter (HCC)   4. Dyslipidemia   5. Dyspnea on exertion    PLAN:    In order of problems listed above:  Ascending aortic aneurysm stable continue monitoring. Essential hypertension blood pressure well-controlled. Dyspnea on exertion obviously concerning.  Last echocardiogram reviewed done in February of this year showing preserved ejection fraction, relaxation abnormality.  I will ask him to have proBNP Chem-7 done to see if there is any role for diuresis.  He also complained of having wheezes I will refer him to pulmonologist. Dyslipidemia I did review K PN which show me his LDL of 121 HDL 90.  He is reluctant to take any medication for it but we will continue that discussion   Medication Adjustments/Labs and Tests Ordered: Current medicines are reviewed at length with the patient today.  Concerns regarding medicines are outlined above.  No orders of the defined  types were placed in this encounter.  Medication changes: No orders of the defined types were placed in this encounter.   Signed, Manfred Seed, MD, [facility] 08/10/2023 10:49 AM    [facility]

## 2023-08-10 NOTE — Addendum Note (Signed)
 Addended by: Shawnee Dellen D on: 08/10/2023 11:16 AM   Modules accepted: Orders

## 2023-08-11 LAB — BASIC METABOLIC PANEL WITH GFR
BUN/Creatinine Ratio: 17 (ref 10–24)
BUN: 19 mg/dL (ref 8–27)
CO2: 18 mmol/L — ABNORMAL LOW (ref 20–29)
Calcium: 9.4 mg/dL (ref 8.6–10.2)
Chloride: 105 mmol/L (ref 96–106)
Creatinine, Ser: 1.13 mg/dL (ref 0.76–1.27)
Glucose: 90 mg/dL (ref 70–99)
Potassium: 4.9 mmol/L (ref 3.5–5.2)
Sodium: 137 mmol/L (ref 134–144)
eGFR: 68 mL/min/{1.73_m2} (ref >59)

## 2023-08-11 LAB — PRO B NATRIURETIC PEPTIDE: NT-Pro BNP: 134 pg/mL (ref 0–486)

## 2023-08-12 ENCOUNTER — Ambulatory Visit: Payer: Self-pay

## 2023-08-12 NOTE — Telephone Encounter (Signed)
 This pt has not been seen yet  There is a referral in place  Routing to admin for scheduling  Thanks

## 2023-08-12 NOTE — Telephone Encounter (Signed)
 Patient has been scheduled

## 2023-08-12 NOTE — Telephone Encounter (Signed)
 FYI Only or Action Required?: Action required by provider  Patient is to be a new patient with Pulmonology for SOB and recurrent bronchitis, referred by PCP. Called Nurse Triage reporting Shortness of Breath, low oxygen levels, Cough, and Dizziness. Symptoms began about a month ago. Interventions attempted: Rescue inhaler and Increased fluids/rest. Symptoms are: rapidly worsening.  Triage Disposition: Go to ED Now (Notify PCP)  Patient/caregiver understands and will follow disposition?: Yes - Needs NP appt asap, scheduling system denied scheduling        Copied from CRM [id number]. Topic: Clinical - Red Word Triage >> Aug 12, 2023 11:00 AM Tyronne Galloway wrote: Red Word that prompted transfer to Nurse Triage: Pt has been dealing with shortness of breath and low oxygen, as low as 73, and this has been happening for a little over a month. New patient, not scheduled, referral from Dr. Krasowski. Pt also has a cough that makes him feel light headed at times. Pt stated he usually hovers around 90 for oxygen reading.  Was drinking whiskey on Monday, oxygen levels  Reason for Disposition  [1] MODERATE difficulty breathing (e.g., speaks in phrases, SOB even at rest, pulse 100-120) AND [2] NEW-onset or WORSE than normal  Answer Assessment - Initial Assessment Questions 2. ONSET: "When did this breathing problem begin?"      Just over a month 3. PATTERN "Does the difficult breathing come and go, or has it been constant since it started?"      Kind of coming and going, has to do with exertion, even just slight short incline, at times while sitting and relaxing, not right now 4. SEVERITY: "How bad is your breathing?" (e.g., mild, moderate, severe)    - MILD: No SOB at rest, mild SOB with walking, speaks normally in sentences, can lie down, no retractions, pulse < 100.    - MODERATE: SOB at rest, SOB with minimal exertion and prefers to sit, cannot lie down flat, speaks in phrases, mild retractions, audible  wheezing, pulse 100-120.    - SEVERE: Very SOB at rest, speaks in single words, struggling to breathe, sitting hunched forward, retractions, pulse > 120      Coughing a lot when lay down flat, coughing and wheezing a lot 6. CARDIAC HISTORY: "Do you have any history of heart disease?" (e.g., heart attack, angina, bypass surgery, angioplasty)      significant 7. LUNG HISTORY: "Do you have any history of lung disease?"  (e.g., pulmonary embolus, asthma, emphysema)     No COPD or asthma but hx bronchitis 3x in winter/early spring, gave me prednisone and antibx and seemed to get better but this oxygen levels not getting where needs to be that's the biggest problem 9. OTHER SYMPTOMS: "Do you have any other symptoms? (e.g., dizziness, runny nose, cough, chest pain, fever)     Coughing up thick green looking stuff, BP been okay lately, HR is up from what it usually is, usually really low in 50s at rest and running in 70s at rest 10. O2 SATURATION MONITOR:  "Do you use an oxygen saturation monitor (pulse oximeter) at home?" If Yes, ask: "What is your reading (oxygen level) today?" "What is your usual oxygen saturation reading?" (e.g., 95%)       92% right now, just been sitting here this morning  Do have an albuterol inhaler that they gave me back in spring, 2x/day, last used last night, seems to help a little bit, right now oxygen 72% 119 HR and now 93% and  60 bpm within few minutes No chest pain Have had some dizziness when oxygen gets real low Some weakness more than usual  Protocols used: Breathing Difficulty-A-AH

## 2023-08-15 ENCOUNTER — Ambulatory Visit: Admitting: Student in an Organized Health Care Education/Training Program

## 2023-08-15 ENCOUNTER — Encounter: Payer: Self-pay | Admitting: Student in an Organized Health Care Education/Training Program

## 2023-08-15 ENCOUNTER — Other Ambulatory Visit
Admission: RE | Admit: 2023-08-15 | Discharge: 2023-08-15 | Disposition: A | Discharge status: Home / Self Care | Source: Home / Self Care | Attending: Student in an Organized Health Care Education/Training Program | Admitting: Student in an Organized Health Care Education/Training Program

## 2023-08-15 ENCOUNTER — Ambulatory Visit
Admission: RE | Admit: 2023-08-15 | Discharge: 2023-08-15 | Disposition: A | Discharge status: Home / Self Care | Source: Ambulatory Visit | Attending: Student in an Organized Health Care Education/Training Program | Admitting: Student in an Organized Health Care Education/Training Program

## 2023-08-15 ENCOUNTER — Ambulatory Visit
Admission: RE | Admit: 2023-08-15 | Discharge: 2023-08-15 | Disposition: A | Discharge status: Home / Self Care | Attending: Student in an Organized Health Care Education/Training Program | Admitting: Student in an Organized Health Care Education/Training Program

## 2023-08-15 VITALS — BP 112/72 | HR 61 | Temp 98.3°F | Ht 74.0 in | Wt 300.2 lb

## 2023-08-15 DIAGNOSIS — R0602 Shortness of breath: Secondary | ICD-10-CM

## 2023-08-15 DIAGNOSIS — J4540 Moderate persistent asthma, uncomplicated: Secondary | ICD-10-CM

## 2023-08-15 DIAGNOSIS — R059 Cough, unspecified: Secondary | ICD-10-CM | POA: Diagnosis not present

## 2023-08-15 LAB — CBC WITH DIFFERENTIAL/PLATELET
Abs Immature Granulocytes: 0.02 10*3/uL (ref 0.00–0.07)
Basophils Absolute: 0 10*3/uL (ref 0.0–0.1)
Basophils Relative: 0 %
Eosinophils Absolute: 0.3 10*3/uL (ref 0.0–0.5)
Eosinophils Relative: 4 %
HCT: 39.3 % (ref 39.0–52.0)
Hemoglobin: 12.8 g/dL — ABNORMAL LOW (ref 13.0–17.0)
Immature Granulocytes: 0 %
Lymphocytes Relative: 22 %
Lymphs Abs: 1.8 10*3/uL (ref 0.7–4.0)
MCH: 31.9 pg (ref 26.0–34.0)
MCHC: 32.6 g/dL (ref 30.0–36.0)
MCV: 98 fL (ref 80.0–100.0)
Monocytes Absolute: 0.8 10*3/uL (ref 0.1–1.0)
Monocytes Relative: 10 %
Neutro Abs: 5.2 10*3/uL (ref 1.7–7.7)
Neutrophils Relative %: 64 %
Platelets: 246 10*3/uL (ref 150–400)
RBC: 4.01 MIL/uL — ABNORMAL LOW (ref 4.22–5.81)
RDW: 12.7 % (ref 11.5–15.5)
WBC: 8.1 10*3/uL (ref 4.0–10.5)
nRBC: 0 % (ref 0.0–0.2)

## 2023-08-15 LAB — NITRIC OXIDE: Nitric Oxide: 141

## 2023-08-15 MED ORDER — AEROCHAMBER MV MISC: 0 refills | Status: AC

## 2023-08-15 MED ORDER — FLUTICASONE-SALMETEROL 115-21 MCG/ACT IN AERO: 2.0000 | INHALATION_SPRAY | Freq: Two times a day (BID) | RESPIRATORY_TRACT | 12 refills | Status: DC

## 2023-08-15 NOTE — Patient Instructions (Signed)
 We will order a breathing test (PFT) that you will need to get scheduled in our clinic and have done before your follow up. I have also ordered a chest CT that you will also need to get scheduled.  Today, I would like for you to get a chest xray in the hospital. I would also like you to get your blood work done at the lab here in the hospital as well.  I will start an inhaler (advair) to be used as follows: two puffs twice daily. Use with a spacer device. Please wash your mouth after every use of the inhaler.
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
## 2023-08-16 ENCOUNTER — Ambulatory Visit: Payer: Self-pay | Admitting: Student in an Organized Health Care Education/Training Program

## 2023-08-17 DIAGNOSIS — R0609 Other forms of dyspnea: Secondary | ICD-10-CM | POA: Diagnosis not present

## 2023-08-17 DIAGNOSIS — J4540 Moderate persistent asthma, uncomplicated: Secondary | ICD-10-CM | POA: Diagnosis not present

## 2023-08-17 DIAGNOSIS — F988 Other specified behavioral and emotional disorders with onset usually occurring in childhood and adolescence: Secondary | ICD-10-CM | POA: Diagnosis not present

## 2023-08-17 DIAGNOSIS — F329 Major depressive disorder, single episode, unspecified: Secondary | ICD-10-CM | POA: Diagnosis not present

## 2023-08-17 LAB — ALLERGEN PANEL (27) + IGE
Alternaria Alternata IgE: <0.1 kU/L
Aspergillus Fumigatus IgE: <0.1 kU/L
Bahia Grass IgE: 0.26 kU/L — AB
Bermuda Grass IgE: 0.28 kU/L — AB
Cat Dander IgE: <0.1 kU/L
Cedar, Mountain IgE: 0.16 kU/L — AB
Cladosporium Herbarum IgE: <0.1 kU/L
Cocklebur IgE: 0.3 kU/L — AB
Cockroach, American IgE: <0.1 kU/L
Common Silver Birch IgE: 0.14 kU/L — AB
D Farinae IgE: <0.1 kU/L
D Pteronyssinus IgE: <0.1 kU/L
Dog Dander IgE: <0.1 kU/L
Elm, American IgE: 0.38 kU/L — AB
Hickory, White IgE: 0.13 kU/L — AB
IgE (Immunoglobulin E), Serum: 433 [IU]/mL (ref 6–495)
Johnson Grass IgE: 0.26 kU/L — AB
Kentucky Bluegrass IgE: 0.23 kU/L — AB
Maple/Box Elder IgE: 0.26 kU/L — AB
Mucor Racemosus IgE: <0.1 kU/L
Oak, White IgE: 0.18 kU/L — AB
Penicillium Chrysogen IgE: <0.1 kU/L
Pigweed, Rough IgE: 0.24 kU/L — AB
Plantain, English IgE: 0.18 kU/L — AB
Ragweed, Short IgE: 0.22 kU/L — AB
Setomelanomma Rostrat: <0.1 kU/L
Timothy Grass IgE: 0.25 kU/L — AB
White Mulberry IgE: 0.15 kU/L — AB

## 2023-08-19 ENCOUNTER — Ambulatory Visit: Payer: Self-pay | Admitting: Cardiology

## 2023-08-19 ENCOUNTER — Ambulatory Visit (HOSPITAL_BASED_OUTPATIENT_CLINIC_OR_DEPARTMENT_OTHER)
Admission: RE | Admit: 2023-08-19 | Discharge: 2023-08-19 | Disposition: A | Discharge status: Home / Self Care | Source: Ambulatory Visit | Attending: Nurse Practitioner | Admitting: Radiology

## 2023-08-19 DIAGNOSIS — R918 Other nonspecific abnormal finding of lung field: Secondary | ICD-10-CM | POA: Diagnosis not present

## 2023-08-19 DIAGNOSIS — R0602 Shortness of breath: Secondary | ICD-10-CM

## 2023-08-19 DIAGNOSIS — J984 Other disorders of lung: Secondary | ICD-10-CM | POA: Diagnosis not present

## 2023-08-19 DIAGNOSIS — I7121 Aneurysm of the ascending aorta, without rupture: Secondary | ICD-10-CM | POA: Diagnosis not present

## 2023-08-25 DIAGNOSIS — Z79899 Other long term (current) drug therapy: Secondary | ICD-10-CM | POA: Diagnosis not present

## 2023-08-25 DIAGNOSIS — M0609 Rheumatoid arthritis without rheumatoid factor, multiple sites: Secondary | ICD-10-CM | POA: Diagnosis not present

## 2023-08-31 ENCOUNTER — Ambulatory Visit (HOSPITAL_BASED_OUTPATIENT_CLINIC_OR_DEPARTMENT_OTHER)
Admission: EM | Admit: 2023-08-31 | Discharge: 2023-08-31 | Disposition: A | Discharge status: Home / Self Care | Attending: Family Medicine | Admitting: Family Medicine

## 2023-08-31 ENCOUNTER — Encounter (HOSPITAL_BASED_OUTPATIENT_CLINIC_OR_DEPARTMENT_OTHER): Payer: Self-pay

## 2023-08-31 DIAGNOSIS — R221 Localized swelling, mass and lump, neck: Secondary | ICD-10-CM

## 2023-08-31 MED ORDER — TRIAMCINOLONE ACETONIDE 40 MG/ML IJ SUSP
40.0000 mg | Freq: Once | INTRAMUSCULAR | Status: AC
  Administered 2023-08-31: 40 mg via INTRAMUSCULAR

## 2023-08-31 NOTE — ED Provider Notes (Signed)
 [facility]    CSN: [id number] Arrival date & time: 08/31/23  1355      History   Chief Complaint Chief Complaint  Patient presents with   Oral Swelling    HPI Brett Rosales is a 78 y.o. male.   Patient is a 78 year old male that presents today with throat swelling, facial swelling.  Reports that he woke up this way.  The symptoms have somewhat improved since awaking.  He is not have any trouble swallowing or breathing.  He has had multiple cases of bronchitis over the past couple months and most recently had some sort of illness 2 to 3 weeks ago.  Has been seen by pulmonology, allergist and cardiology most recently.  Unsure of the cause of his symptoms.  Denies any changes in foods or daily household products.  Denies being bit by any insects.  Did do some work outside in the yard yesterday.  No fevers, chills, cough, nasal congestion.  No pain       Past Medical History:  Diagnosis Date   Acute foreign body of ear canal 11/12/2015   Acute pain of left wrist 12/02/2022   Anxiety    medication made him feel flat stopped meds 09/07/2018   Arthritis    Ascending aortic aneurysm (HCC) 12/23/2017   Bilateral chronic knee pain 08/14/2019   BMI 39.0-39.9,adult 06/03/2020   Complication of anesthesia    Knee block didn't take in 7/21 lt knee   Depression    Dyslipidemia 01/23/2019   Dyspnea on exertion 12/09/2017   Dysrhythmia 11/2017   a flutter   Essential hypertension 12/09/2017   History of kidney stones    13 stones   Hypertension    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 it was inconclusive. 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   Lumbar spondylosis 10/20/2021   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   Snoring 08/13/2021   Other chronic pain 01/01/2021   Restless sleeper 01/01/2021   Atrial flutter (HCC) 01/01/2021   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/29/2020   Other sleep apnea 07/29/2020   Complication of anesthesia    Right inguinal pain 06/03/2020   Anxiety    Arthritis    Depression    History of kidney stones    Hypertension    S/P TKR (total knee replacement) using cement, 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   Bilateral chronic knee pain 08/14/2019   Dyslipidemia 01/23/2019   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   Pain of left hip joint 08/04/2018   Swelling of left lower extremity 05/17/2018   Sleep apnea 03/2018   Ascending aortic aneurysm (HCC) 12/23/2017   Typical atrial flutter (HCC) 12/16/2017   Supraventricular tachycardia (HCC) 12/09/2017   Essential hypertension 12/09/2017   Dyspnea on exertion 12/09/2017   Rheumatoid arthritis (HCC) 12/09/2017   Dysrhythmia 11/2017   Acute foreign body of ear canal 11/12/2015    Past Surgical History:  Procedure Laterality Date   TONSILLECTOMY AND ADENOIDECTOMY     TOTAL HIP ARTHROPLASTY Left  09/21/2018   Procedure: TOTAL HIP ARTHROPLASTY ANTERIOR APPROACH;  Surgeon: Fidel Rogue, MD;  Location: [facility];  Service: Orthopedics;  Laterality: Left;   TOTAL KNEE ARTHROPLASTY Left 09/24/2019   Procedure: TOTAL KNEE ARTHROPLASTY;  Surgeon: Rubie Kemps, MD;  Location: [facility];  Service: Orthopedics;  Laterality: Left;   TOTAL KNEE ARTHROPLASTY Right 02/04/2020   Procedure: TOTAL KNEE ARTHROPLASTY;  Surgeon: Rubie Kemps, MD;  Location: [facility];  Service: Orthopedics;  Laterality: Right;   VASECTOMY  1990       Home Medications    Prior to Admission medications   Medication Sig Start Date End Date Taking? Authorizing Provider  albuterol (VENTOLIN HFA) 108 (90 Base) MCG/ACT inhaler Inhale 2 puffs into the lungs as needed for wheezing or shortness of breath. 01/09/23   [provider]  amLODipine  (NORVASC ) 5 MG tablet Take 5 mg by mouth daily.    [provider]  B Complex Vitamins (VITAMIN B COMPLEX) TABS Take 1 tablet by mouth daily.    [provider]  Cholecalciferol (VITAMIN D3 PO) Take 2,000 Units by mouth daily.    [provider]  DULoxetine (CYMBALTA) 30 MG capsule Take 30 mg by mouth daily. 07/23/19   [provider]  fluticasone -salmeterol (ADVAIR HFA) 115-21 MCG/ACT inhaler Inhale 2 puffs into the lungs 2 (two) times daily. 08/15/23   Isadora Hose, MD  hydrochlorothiazide  (HYDRODIURIL ) 25 MG tablet TAKE 1 TABLET DAILY 04/19/23   Krasowski, Robert J, MD  inFLIXimab  (REMICADE  IV) Inject 1 vial into the vein See admin instructions. Every 6 weeks/ Unknown strenght    [provider]  losartan  (COZAAR ) 100 MG tablet Take 100 mg by mouth daily.    [provider]  methylphenidate  (RITALIN ) 20 MG tablet Take 20 mg by mouth daily.    [provider]  metoprolol  tartrate (LOPRESSOR ) 25 MG tablet Take 1 tablet (25 mg total) by mouth 2 (two) times daily. 12/15/22   Krasowski, Robert J, MD  pravastatin  (PRAVACHOL ) 40 MG  tablet Take 1 tablet (40 mg total) by mouth every evening. 02/09/22   Krasowski, Robert J, MD  Spacer/Aero-Holding Chambers (AEROCHAMBER MV) inhaler Use as instructed 08/15/23   Isadora Hose, MD  spironolactone  (ALDACTONE ) 25 MG tablet Take 0.5 tablets (12.5 mg total) by mouth daily. 02/08/23 08/15/23  Krasowski, Robert J, MD  traZODone (DESYREL) 50 MG tablet Take 50 mg by mouth at bedtime as needed for sleep. 01/05/23   [provider]  XARELTO  20 MG TABS tablet TAKE 1 TABLET BY MOUTH DAILY WITH SUPPER 06/21/23   Krasowski, Robert J, MD    Family History Family History  Problem Relation Age of Onset   Diabetes Mother    Cancer Mother    Hypertension Father    Diabetes Paternal Grandmother     Social History Social History   Tobacco Use   Smoking status: Former    Current packs/day: 0.00  Average packs/day: 1 pack/day for 15.0 years (15.0 ttl pk-yrs)    Types: Cigarettes    Start date: 01/30/1971    Quit date: 01/29/1986    Years since quitting: 37.6   Smokeless tobacco: Never  Vaping Use   Vaping status: Never Used  Substance Use Topics   Alcohol use: No    Alcohol/week: 0.0 standard drinks of alcohol   Drug use: No     Allergies   Nsaids, Hydroxychloroquine, Ibuprofen, and Statins   Review of Systems Review of Systems  See HPI Physical Exam Triage Vital Signs ED Triage Vitals  Encounter Vitals Group     BP 08/31/23 1416 136/80     Girls Systolic BP Percentile --      Girls Diastolic BP Percentile --      Boys Systolic BP Percentile --      Boys Diastolic BP Percentile --      Pulse Rate 08/31/23 1416 73     Resp 08/31/23 1416 20     Temp 08/31/23 1416 98.5 F (36.9 C)     Temp Source 08/31/23 1416 Oral     SpO2 08/31/23 1416 96 %     Weight --      Height --      Head Circumference --      Peak Flow --      Pain Score 08/31/23 1417 0     Pain Loc --      Pain Education --      Exclude from Growth Chart --    No data found.  Updated Vital  Signs BP 136/80 (BP Location: Right Arm)   Pulse 73   Temp 98.5 F (36.9 C) (Oral)   Resp 20   SpO2 96%   Visual Acuity Right Eye Distance:   Left Eye Distance:   Bilateral Distance:    Right Eye Near:   Left Eye Near:    Bilateral Near:     Physical Exam Vitals and nursing note reviewed.  Constitutional:      General: He is not in acute distress.    Appearance: Normal appearance. He is not ill-appearing, toxic-appearing or diaphoretic.  HENT:     Nose: Nose normal.     Mouth/Throat:     Pharynx: Posterior oropharyngeal erythema present.     Comments: Uvula swelling but midline Mild lip swelling  Eyes:     Conjunctiva/sclera: Conjunctivae normal.    Cardiovascular:     Rate and Rhythm: Normal rate and regular rhythm.     Pulses: Normal pulses.     Heart sounds: Normal heart sounds.  Pulmonary:     Effort: Pulmonary effort is normal.     Breath sounds: Normal breath sounds.   Musculoskeletal:        General: Normal range of motion.     Cervical back: No tenderness.  Lymphadenopathy:     Cervical: No cervical adenopathy.   Skin:    General: Skin is warm and dry.   Neurological:     Mental Status: He is alert.   Psychiatric:        Mood and Affect: Mood normal.      UC Treatments / Results  Labs (all labs ordered are listed, but only abnormal results are displayed) Labs Reviewed - No data to display  EKG   Radiology No results found.  Procedures Procedures (including critical care time)  Medications Ordered in UC Medications  triamcinolone  acetonide (KENALOG -40) injection 40 mg (40 mg Intramuscular Given  08/31/23 1515)    Initial Impression / Assessment and Plan / UC Course  I have reviewed the triage vital signs and the nursing notes.  Pertinent labs & imaging results that were available during my care of the patient were reviewed by me and considered in my medical decision making (see chart for details).     Throat swelling-patient  with positive uvula swelling on exam.  He is not having any trouble swallowing or breathing.  Lips are mildly swollen. Looking back at most recent allergy testing, CT scans, x-rays, visits from allergist, pulmonologist believe most likely his symptoms are allergy related.  He was outside yesterday and has multiple allergies to different grasses and trees. He was not aware of this.  Allergy results were reviewed here in clinic today along with CT scan results. Most recent CT scan showed bronchopneumonia.  Believe he was suffering from this a few weeks ago but this has resolved.  He has had multiple bouts of bronchitis over the past few months which most likely are allergy related. The bronchopneumonia may have been more viral but this is resolved I recommend today that he start taking allergy medication to include Zyrtec daily.  Will also start him on Pepcid 20 mg daily. Given steroid injection here in clinic Patient has follow-up with pulmonologist in the upcoming weeks ER for worsening symptoms Final Clinical Impressions(s) / UC Diagnoses   Final diagnoses:  Throat swelling     Discharge Instructions      I believe that a lot of your symptoms are allergy related.  I recommend starting on Zyrtec 10 mg daily and Pepcid 20 mg daily Steroid injection given here for throat swelling and facial swelling Return here as needed but otherwise please follow-up with the pulmonologist and allergist    ED Prescriptions   None    PDMP not reviewed this encounter.   Adah Wilbert LABOR, FNP 08/31/23 1558

## 2023-08-31 NOTE — Discharge Instructions (Signed)
 I believe that a lot of your symptoms are allergy related.  I recommend starting on Zyrtec 10 mg daily and Pepcid 20 mg daily Steroid injection given here for throat swelling and facial swelling Return here as needed but otherwise please follow-up with the pulmonologist and allergist

## 2023-08-31 NOTE — ED Triage Notes (Signed)
 States woke up at 0730 today with sensation of lips swelling and throat swelling. Experiencing some excessive production of phlegm and shortness of breath. Had CXR and MRI recently. Has seen cardiology and pulmonology recently as well.

## 2023-09-13 ENCOUNTER — Telehealth: Payer: Self-pay | Admitting: Cardiology

## 2023-09-13 MED ORDER — SPIRONOLACTONE 25 MG PO TABS: 12.5000 mg | ORAL_TABLET | Freq: Every day | ORAL | 3 refills | Status: AC

## 2023-09-13 NOTE — Telephone Encounter (Signed)
*  STAT* If patient is at the pharmacy, call can be transferred to refill team.   1. Which medications need to be refilled? (please list name of each medication and dose if known)   spironolactone  (ALDACTONE ) 25 MG tablet (Expired)   2. Would you like to learn more about the convenience, safety, & potential cost savings by using the [facility]?   3. Are you open to using the [facility] (Type [facility]. ).  4. Which pharmacy/location (including street and city if local pharmacy) is medication to be sent to?  [facility] - [place], [place] - [address]   5. Do they need a 30 day or 90 day supply?   90 day  Patient stated he still has some medication.

## 2023-09-13 NOTE — Telephone Encounter (Signed)
 Pt's medication was sent to pt's pharmacy as requested. Confirmation received.

## 2023-09-17 ENCOUNTER — Other Ambulatory Visit: Payer: Self-pay | Admitting: Cardiology

## 2023-09-17 ENCOUNTER — Other Ambulatory Visit: Payer: Self-pay | Admitting: Neurology

## 2023-09-17 DIAGNOSIS — F09 Unspecified mental disorder due to known physiological condition: Secondary | ICD-10-CM

## 2023-09-22 ENCOUNTER — Ambulatory Visit: Admitting: Student in an Organized Health Care Education/Training Program

## 2023-09-22 ENCOUNTER — Encounter: Payer: Self-pay | Admitting: Student in an Organized Health Care Education/Training Program

## 2023-09-22 VITALS — BP 116/60 | HR 81 | Ht 74.0 in | Wt 295.0 lb

## 2023-09-22 DIAGNOSIS — J4540 Moderate persistent asthma, uncomplicated: Secondary | ICD-10-CM | POA: Diagnosis not present

## 2023-09-22 DIAGNOSIS — R0602 Shortness of breath: Secondary | ICD-10-CM | POA: Diagnosis not present

## 2023-09-22 DIAGNOSIS — Z87891 Personal history of nicotine dependence: Secondary | ICD-10-CM

## 2023-09-22 LAB — PULMONARY FUNCTION TEST
DL/VA % pred: 91 %
DL/VA: 3.54 ml/min/mmHg/L
DLCO unc % pred: 80 %
DLCO unc: 22.5 ml/min/mmHg
FEF 25-75 Post: 1.95 L/s
FEF 25-75 Pre: 1.03 L/s
FEF2575-%Change-Post: 89 %
FEF2575-%Pred-Post: 74 %
FEF2575-%Pred-Pre: 39 %
FEV1-%Change-Post: 23 %
FEV1-%Pred-Post: 71 %
FEV1-%Pred-Pre: 57 %
FEV1-Post: 2.57 L
FEV1-Pre: 2.08 L
FEV1FVC-%Change-Post: 10 %
FEV1FVC-%Pred-Pre: 81 %
FEV6-%Change-Post: 14 %
FEV6-%Pred-Post: 82 %
FEV6-%Pred-Pre: 72 %
FEV6-Post: 3.86 L
FEV6-Pre: 3.37 L
FEV6FVC-%Change-Post: 2 %
FEV6FVC-%Pred-Post: 103 %
FEV6FVC-%Pred-Pre: 101 %
FVC-%Change-Post: 12 %
FVC-%Pred-Post: 79 %
FVC-%Pred-Pre: 71 %
FVC-Post: 3.93 L
FVC-Pre: 3.51 L
Post FEV1/FVC ratio: 65 %
Post FEV6/FVC ratio: 98 %
Pre FEV1/FVC ratio: 59 %
Pre FEV6/FVC Ratio: 96 %
RV % pred: 118 %
RV: 3.29 L
TLC % pred: 94 %
TLC: 7.45 L

## 2023-09-22 MED ORDER — AMOXICILLIN-POT CLAVULANATE 875-125 MG PO TABS: 1.0000 | ORAL_TABLET | Freq: Two times a day (BID) | ORAL | 0 refills | Status: AC

## 2023-09-22 MED ORDER — AZITHROMYCIN 250 MG PO TABS: ORAL_TABLET | ORAL | 0 refills | Status: AC
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
## 2023-09-22 NOTE — Patient Instructions (Signed)
 Full PFT completed today ? ?

## 2023-09-22 NOTE — Progress Notes (Signed)
 Full PFT completed today ? ?

## 2023-10-06 DIAGNOSIS — M0609 Rheumatoid arthritis without rheumatoid factor, multiple sites: Secondary | ICD-10-CM | POA: Diagnosis not present

## 2023-10-13 ENCOUNTER — Ambulatory Visit: Payer: Medicare Other | Admitting: Neurology

## 2023-10-31 DIAGNOSIS — J4541 Moderate persistent asthma with (acute) exacerbation: Secondary | ICD-10-CM | POA: Diagnosis not present

## 2023-10-31 DIAGNOSIS — J45909 Unspecified asthma, uncomplicated: Secondary | ICD-10-CM | POA: Diagnosis not present

## 2023-10-31 DIAGNOSIS — J3089 Other allergic rhinitis: Secondary | ICD-10-CM | POA: Diagnosis not present

## 2023-11-08 ENCOUNTER — Encounter: Payer: Self-pay | Admitting: Psychology

## 2023-11-08 ENCOUNTER — Encounter: Payer: Medicare Other | Attending: Psychology | Admitting: Psychology

## 2023-11-08 DIAGNOSIS — G4734 Idiopathic sleep related nonobstructive alveolar hypoventilation: Secondary | ICD-10-CM | POA: Diagnosis not present

## 2023-11-08 DIAGNOSIS — F09 Unspecified mental disorder due to known physiological condition: Secondary | ICD-10-CM | POA: Insufficient documentation

## 2023-11-08 DIAGNOSIS — R413 Other amnesia: Secondary | ICD-10-CM | POA: Insufficient documentation

## 2023-11-08 DIAGNOSIS — G4733 Obstructive sleep apnea (adult) (pediatric): Secondary | ICD-10-CM | POA: Insufficient documentation

## 2023-11-08 NOTE — Progress Notes (Signed)
 Neuropsychological Evaluation   Patient:  Brett Rosales   DOB: 07/12/1945  MR Number: [id number]  Location: [facility] [address], [address] [place] [address] Dept: [phone]  Start: 10 AM End: 11 AM  Today's visit was 2 hours in total with 1 hour and 15 minutes in face-to-face clinical interview and the other 45 minutes with reviewing records and setting up testing protocols.  The patient and his wife were present along with myself for an in person visit in my outpatient clinic office.  Limits of confidentiality were reviewed again with the patient understanding the process of a neuropsychological evaluation and the fact that this evaluation will be made available in his electronic medical records and provided to his referring neurology group.  The patient and wife both consent to proceed with this evaluative process.  Provider/Observer:     Norleen JONELLE Asa PsyD  Chief Complaint:      Chief Complaint  Patient presents with   Memory Loss   Sleeping Problem   Stress    Reason For Service:     Brett Rosales is a 78 year old male referred for a neuropsychological evaluation by Lauraine Born, NP with [facility].  Patient was initially seen by Dr. Chalice and has also been seen by Dr. Onita.  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  Patient and wife have noticed a gradual onset of memory loss that began around the time of the start of COVID (2019/2020).  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  Patient's wife felt that she had noted changes in memory and forgetting new information to a greater degree  after each surgery.  The patient has had repeat MoCA examinations over this time and have ranged between 23/30 and 28/30.  His most recent MoCA was 23/30.  The patient has not been able to exercise regularly and has a recent weight gain but had lost considerable amount of weight from years prior.  The patient is noted to have very loud snoring and will wake up sometimes in the middle of the night catching his breath.  Patient noted symptoms consistent with an apneic event even while awake recently in the dentist office.  Patient reports that he was diagnosed with obstructive sleep apnea 25 years ago and wore CPAP machine briefly.  Patient has been hesitant around evaluation for obstructive sleep apnea.  A good bit of time was spent in today's visit encouraging him to follow-up with that as it absolutely could be playing at least some degree of a role in his current symptoms.  Note:  Patient had new sleep study performed by Dr. Chalice and interpreted on 11/09/2022 with identification of severe obstructive sleep apnea and most concerning hypoxemia although there was some concern noted about hypoxemia potentially being artifact.  The patient began compliantly using a BiPAP machine with noted significant improvement in overall function as well as improvement in cognitive functioning.   11/08/2023 follow-up neuropsychological evaluation with clinical interview:  This is a follow-up neuropsychological evaluation. Previous evaluation was in 2024.  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  Patient and wife both report significant improvement in cognitive functioning and overall functioning as well with  compliant BiPAP use.  Patient and wife report recent worsening of cognitive functioning particularly for memory and word finding difficulties that were also overlapping at time of the development of pulmonary issues that have now been diagnosed with asthma and significant periods of time with low oxygen levels particularly around physical movement.  Reports improvement in cognitive function and normalization of excessive REM sleep with regular BiPAP use. However, presents with new complaints of significant shortness of breath for the last six months, diagnosed as asthma by pulmonology. A nebulizer was prescribed one week ago and has provided significant relief from dyspnea, cough, and phlegm. Physical activity, such as walking on an incline, exacerbates shortness of breath. Reports dizziness with dyspnea. Has experienced bronchitis three times in the spring. Reports a possible COVID infection approximately 2-3 months ago with symptoms of a severe sore throat. Denies a history of epilepsy, tremors, or hallucinations. No changes in personality or behavioral style are reported. Reports non-adherence to BiPAP for the past month due to breathing difficulties, with oxygen saturation levels dropping into the 80s and as low as 79. Clinician reiterated the importance of addressing breathing issues and resuming BiPAP use. Previously discontinued Aricept  and Namenda . Reports side effects of bowel incontinence with Aricept .  Onset and Duration of Symptoms: The onset of shortness of breath was approximately six months ago. Cognitive decline, particularly word-finding difficulties, was noticed approximately four to five months ago. Non-adherence with BiPAP has been for the last month due to respiratory issues complicating BiPAP use.  Progression of Symptoms: The patient's wife reports cognitive function improves with consistent BiPAP use. Reports recent cognitive worsening, which correlates with the onset of respiratory  issues and decreased BiPAP use.  Behavioral Observation/Mental Status: Engaged and cooperative throughout the session. Attention and concentration appeared adequate. Recent memory is impaired, as evidenced by difficulty recalling supper from the previous night without cues. Remote memory appears intact. Speech is of normal volume and quality, though reports word-finding difficulties. Thought process is coherent, relevant, and organized. Thought content is appropriate, with no evidence of suicidal or homicidal ideation. Oriented to person, place, and situation. Judgment and planning appear to be within normal limits. Mood is euthymic, affect is appropriate. Insight into cognitive and physical difficulties is good. Intelligence is estimated to be in the average range.  I have included a copy of the initial clinical information derived back in 2024 below for convenience.  During the clinical visit in my office (09/02/2022), the patient and wife report that the last 4 years have been really tough.  There were a lot of psychosocial issues associated with her attempts to help 2 brothers who are having increasing medical and cognitive issues.  The patient describes increasing difficulty remembering where he put things and recalling any new information.  He has had some isolated events of geographic disorientation including missing turns of places that he should have been very familiar with.  The patient reports that he has not really gotten lost during these events but did get turned around and went the wrong way for period of time.  The patient notes multiple things that been making life harder than it should be.  The patient reports that he has a very mild tremor at times in his left hand and  has some weakness in his left arm but has had a past left shoulder surgery.  The patient describes very rare visual hallucination at night with no delusions.  The patient denies any word finding issues except when he is very  agitated and/or stressed.  He reports that his biggest issue is remembering to do various things that he had planned on doing and misplacing objects.  The patient describes both episodic as well as semantic memory issues for newly learned/experienced information.   The patient denies any family history of progressive neurological disorders later in life.   The patient describes his sleep as being pretty good but his wife highlights the issue of how bad his snoring is at night but she was unsure of times where he may stop breathing.  Patient's wife does put a lot of effort in making sure that he gets to his appointments and remembers things.  They describe a slow progression with seemingly abrupt changes after various orthopedic surgeries.  Patient denies significant depression or other mood based disturbance and there is been no particular change in behavior or personality styles.  The patient has had some episodes of significant stress creating anxiety and depressive type symptoms.  The patient describes these as primarily psychosocial in nature and there was a very stressful epic of time around COVID where he and his wife are trying to take care of 2 individuals and it became a giant stressor for them.  This is no longer an issue and the patient reports that his mood is better, although he does have times where he gets relatively stressed and agitated.  Below you will find issues related to past medical history.   The the patient was diagnosed with obstructive sleep apnea roughly 25 years ago and had a brief stent with CPAP that was discontinued.  The patient has had a more recent sleep study after episode of dysarthria that was inconclusive.  Patient reports that he simply did not sleep during that study.  I have strongly encouraged the patient to follow-up with that and do everything we can to get a more valid sleep study conducted.   The patient does note that he has had some changes in coordination  and weakness.  Patient notes that he tends to drop a lot of things and has been stumbling more than typical.  While his MRI in 2022 only showed age-appropriate atrophy and age-appropriate small vessel changes there was a note of a very localized region in his left frontal brain region that may have reflected a small lacunar infarction in the past.  This was unclear and just hypothesize over some other etiological factors.   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   The patient is taking both Aricept  and Namenda  without apparent side effects, although he is having more vivid dreams that are at least considered to be side effects or potential side effects of Aricept .  The patient took Cymbalta back in 2021 during periods of anxiety and depression with significant psychosocial stressors.   Medical History:                         Past Medical History:  Diagnosis Date  Acute foreign body of ear canal 11/12/2015   Anxiety      medication made him feel flat stopped meds 09/07/2018   Arthritis     Ascending aortic aneurysm (HCC) 12/23/2017   Bilateral chronic knee pain 08/14/2019   BMI 39.0-39.9,adult 06/03/2020   Complication of anesthesia      Knee block didn't take in 7/21 lt knee   Depression     Dyslipidemia 01/23/2019   Dyspnea on exertion 12/09/2017   Dysrhythmia 11/2017    a flutter   Essential hypertension 12/09/2017   History of kidney stones      13 stones   Hypertension     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 it was inconclusive. 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   Snoring 08/13/2021   Other chronic pain 01/01/2021   Restless sleeper 01/01/2021   Atrial flutter (HCC) 01/01/2021   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/29/2020   Other sleep apnea 07/29/2020   Complication of anesthesia     Right inguinal pain 06/03/2020   Anxiety     Arthritis     Depression     History of kidney stones     Hypertension     S/P TKR (total knee replacement) using cement, 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   Bilateral chronic knee pain 08/14/2019   Dyslipidemia 01/23/2019   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   Pain of left hip joint 08/04/2018   Swelling of left lower extremity 05/17/2018   Sleep apnea 03/2018   Ascending aortic aneurysm (HCC) 12/23/2017   Typical atrial flutter (HCC) 12/16/2017   Supraventricular tachycardia 12/09/2017   Essential hypertension 12/09/2017   Dyspnea on exertion 12/09/2017   Rheumatoid arthritis (HCC) 12/09/2017   Dysrhythmia 11/2017   Acute foreign body of ear canal 11/12/2015    Additional Tests and Measures from other records:   Neuroimaging Results: MRI conducted on 09/11/2020 suggested mild age-appropriate atrophy and mild age-appropriate microvascular ischemic changes.   11/09/2022:  Sleep Study:  IMPRESSION:  This HST confirms the presence of severe sleep apnea ( OSA ) which was not REM sleep accentuated. There was also  severest hypoxia, for more than half of the total sleep time.  Sleep quality was poor, with 135 minutes of wakefulness after sleep onset.   It is the hypoxemia that is most concerning. I have no explanation for this , could this be an artefact?   Impression/Diagnosis:   This is a follow-up evaluation for a person  with a history of cognitive complaints. Initial improvements were noted with BiPAP therapy. Recently developed significant respiratory issues diagnosed as asthma, leading to severe dyspnea, which has impacted BiPAP adherence over the past month. Oxygen saturation has been documented in the low 80s. A corresponding decline in cognitive function, mainly word-finding, has been reported over the past 4-5 months. Functional difficulties include dyspnea on exertion, especially on inclines, and associated dizziness. Memory recall is improved with cueing.  Disposition/Plan:                      Will repeat neuropsychological testing in a couple of weeks to allow for stabilization of respiratory symptoms with the new nebulizer treatment and a return to consistent BiPAP use. Testing will be administered by the psychometrician, Annalise, and will include measures previously administered to allow for sensitive comparison. A formal report will be generated and made available in the EMR. Advised to resume BiPAP as soon as able. Advised on the importance of consistent physical activity, suggesting chair exercises (stand-ups) to build strength safely and reduce fall risk. Advised to stand and count to five before walking to prevent syncopal episodes possibly leading to falls or other injuries. Advised to wear a mask during activities that create dust, such as mowing or construction work, to avoid triggering asthmatic responses.  Patient questioned about restarting Aricept  or Namenda  and I overall defer to his treating neurology group but suggested that he may want to wait until after respiratory issues are  stabilized and BiPAP use is consistent for at least one month. Recommended trying one medication at a time, suggesting Aricept  first to assess for side effects.  Diagnosis:    Memory loss  Mild cognitive disorder  Chronic intermittent hypoxia with obstructive sleep apnea   _____________________ Norleen Asa, Psy.D. Clinical Neuropsychologist

## 2023-11-11 ENCOUNTER — Ambulatory Visit: Attending: Cardiology | Admitting: Cardiology

## 2023-11-11 ENCOUNTER — Encounter: Payer: Self-pay | Admitting: Cardiology

## 2023-11-11 VITALS — BP 124/78 | HR 64 | Ht 74.0 in | Wt 297.2 lb

## 2023-11-11 DIAGNOSIS — E785 Hyperlipidemia, unspecified: Secondary | ICD-10-CM | POA: Insufficient documentation

## 2023-11-11 DIAGNOSIS — I10 Essential (primary) hypertension: Secondary | ICD-10-CM | POA: Insufficient documentation

## 2023-11-11 DIAGNOSIS — I483 Typical atrial flutter: Secondary | ICD-10-CM | POA: Diagnosis not present

## 2023-11-11 DIAGNOSIS — I7121 Aneurysm of the ascending aorta, without rupture: Secondary | ICD-10-CM | POA: Diagnosis not present

## 2023-11-11 NOTE — Patient Instructions (Signed)
Medication Instructions:  Your physician recommends that you continue on your current medications as directed. Please refer to the Current Medication list given to you today.  *If you need a refill on your cardiac medications before your next appointment, please call your pharmacy*   Lab Work: Lipid, AST, ALT- today If you have labs (blood work) drawn today and your tests are completely normal, you will receive your results only by: MyChart Message (if you have MyChart) OR A paper copy in the mail If you have any lab test that is abnormal or we need to change your treatment, we will call you to review the results.   Testing/Procedures: None Ordered   Follow-Up: At [facility], you and your health needs are our priority.  As part of our continuing mission to provide you with exceptional heart care, we have created designated Provider Care Teams.  These Care Teams include your primary Cardiologist (physician) and Advanced Practice Providers (APPs -  Physician Assistants and Nurse Practitioners) who all work together to provide you with the care you need, when you need it.  We recommend signing up for the patient portal called "MyChart".  Sign up information is provided on this After Visit Summary.  MyChart is used to connect with patients for Virtual Visits (Telemedicine).  Patients are able to view lab/test results, encounter notes, upcoming appointments, etc.  Non-urgent messages can be sent to your provider as well.   To learn more about what you can do with MyChart, go to [email or web address].    Your next appointment:   6 month(s)  The format for your next appointment:   In Person  Provider:   Gypsy Balsam, MD    Other Instructions NA

## 2023-11-11 NOTE — Progress Notes (Signed)
 Cardiology Office Note:    Date:  11/11/2023   ID:  Brett Rosales, DOB [birth year], MRN [id number]  PCP:  Benjamine Lauraine DASEN, NP  Cardiologist:  Lamar Fitch, MD    Referring MD: Benjamine Lauraine DASEN, NP   No chief complaint on file.   History of Present Illness:    Brett Rosales is a 78 y.o. male with a past medical history significant for paroxysmal atrial flutter, maintained sinus rhythm, anticoagulated, ascending aortic aneurysm 44 mm, dyslipidemia, dyspnea exertion, bronchospasm.  Comes today 2 months for follow-up.  He was put on nebulizer feels dramatically better he can do a lot no chest pain tightness squeezing pressure burning chest no palpitations overall doing well  Past Medical History:  Diagnosis Date   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   Acute foreign body of ear canal 11/12/2015   Acute pain of left wrist 12/02/2022   Anxiety    medication made him feel flat stopped meds 09/07/2018   Arthritis    Ascending aortic aneurysm (HCC) 12/23/2017   Atrial flutter (HCC) 01/01/2021   Bilateral chronic knee pain 08/14/2019   Bilateral hand swelling 08/09/2023   Chronic fatigue 08/09/2023   Chronic intermittent hypoxia with obstructive sleep apnea 11/16/2022   Complication of anesthesia    Knee block didn't take in 7/21 lt knee   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   Depression    Dyslipidemia 01/23/2019   Dyspnea on exertion 12/09/2017   Dysrhythmia 11/2017   a flutter   Encounter for long-term (current) use of high-risk medication 08/09/2023   Essential hypertension 12/09/2017   History of kidney stones    13 stones   Hypertension    Lumbar spondylosis 10/20/2021   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/29/2020   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   Other chronic pain 01/01/2021   Other sleep apnea 07/29/2020   Pain of left hip joint 08/04/2018   Paresthesia of both hands 08/09/2023   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 it was inconclusive. needs follow up   Snoring 08/13/2021   Supraventricular tachycardia (HCC) 12/09/2017   Swelling of left lower extremity 05/17/2018   Typical atrial flutter (HCC) 12/16/2017    Past Surgical History:  Procedure Laterality Date   TONSILLECTOMY AND ADENOIDECTOMY     TOTAL HIP ARTHROPLASTY Left 09/21/2018   Procedure: TOTAL HIP ARTHROPLASTY ANTERIOR APPROACH;  Surgeon: Fidel Rogue, MD;  Location: [facility];  Service: Orthopedics;  Laterality: Left;   TOTAL KNEE ARTHROPLASTY Left 09/24/2019   Procedure: TOTAL KNEE ARTHROPLASTY;  Surgeon: Rubie Kemps, MD;  Location: [facility];  Service: Orthopedics;  Laterality: Left;   TOTAL KNEE ARTHROPLASTY Right 02/04/2020   Procedure: TOTAL KNEE ARTHROPLASTY;  Surgeon: Rubie Kemps, MD;  Location: [facility];  Service: Orthopedics;  Laterality: Right;   VASECTOMY  1990    Current Medications: Current Meds  Medication Sig   albuterol (VENTOLIN HFA) 108 (90 Base) MCG/ACT inhaler Inhale 2 puffs into the lungs as needed for wheezing or shortness of breath.   amLODipine  (NORVASC ) 5 MG tablet Take 5 mg by mouth daily.  B Complex Vitamins (VITAMIN B COMPLEX) TABS Take 1 tablet by mouth daily.   Cholecalciferol (VITAMIN D3 PO) Take 2,000 Units by mouth daily.   DULoxetine (CYMBALTA) 30 MG capsule Take 30 mg by mouth daily.   hydrochlorothiazide  (HYDRODIURIL ) 25 MG tablet TAKE 1 TABLET DAILY   inFLIXimab  (REMICADE  IV) Inject 1 vial into the vein See admin instructions. Every 6 weeks/ Unknown strenght   losartan  (COZAAR ) 100 MG tablet Take 100 mg by mouth  daily.   methylphenidate  (RITALIN ) 5 MG tablet Take 5 mg by mouth 2 (two) times daily.   metoprolol  tartrate (LOPRESSOR ) 25 MG tablet Take 1 tablet (25 mg total) by mouth 2 (two) times daily.   montelukast (SINGULAIR) 10 MG tablet Take 10 mg by mouth daily.   rosuvastatin (CRESTOR) 10 MG tablet Take 10 mg by mouth at bedtime.   Spacer/Aero-Holding Chambers (AEROCHAMBER MV) inhaler Use as instructed   spironolactone  (ALDACTONE ) 25 MG tablet Take 0.5 tablets (12.5 mg total) by mouth daily.   traZODone (DESYREL) 50 MG tablet Take 50 mg by mouth at bedtime as needed for sleep.   XARELTO  20 MG TABS tablet TAKE 1 TABLET BY MOUTH DAILY WITH SUPPER     Allergies:   Nsaids, Hydroxychloroquine, Ibuprofen, and Statins   Social History   Socioeconomic History   Marital status: Married    Spouse name: [name]   Number of children: 3   Years of education: Not on file   Highest education level: High school graduate  Occupational History   Not on file  Tobacco Use   Smoking status: Former    Current packs/day: 0.00    Average packs/day: 1 pack/day for 15.0 years (15.0 ttl pk-yrs)    Types: Cigarettes    Start date: 01/30/1971    Quit date: 01/29/1986    Years since quitting: 37.8   Smokeless tobacco: Never  Vaping Use   Vaping status: Never Used  Substance and Sexual Activity   Alcohol use: No    Alcohol/week: 0.0 standard drinks of alcohol   Drug use: No   Sexual activity: Yes    Birth control/protection: None  Other Topics Concern   Not on file  Social History Narrative   Lives with wife   Right handed   Caffeine: occa drinks coffee, no energy drink. Ginger tea   Social Drivers of Corporate investment banker Strain: Not on file  Food Insecurity: Not on file  Transportation Needs: Not on file  Physical Activity: Not on file  Stress: Not on file  Social Connections: Not on file     Family History: The patient's family history includes Cancer in his mother; Diabetes in his  mother and paternal grandmother; Hypertension in his father. ROS:   Please see the history of present illness.    All 14 point review of systems negative except as described per history of present illness  EKGs/Labs/Other Studies Reviewed:    EKG Interpretation Date/Time:  Friday November 11 2023 11:15:36 EDT Ventricular Rate:  64 PR Interval:  168 QRS Duration:  166 QT Interval:  432 QTC Calculation: 445 R Axis:   -35  Text Interpretation: Normal sinus rhythm Left axis deviation Right bundle branch block Abnormal ECG When compared with ECG of 04-Nov-2022 16:55, No significant change was found Confirmed by Bernie Charleston [phone]) on 11/11/2023 11:22:14 AM    Recent Labs: 08/10/2023: BUN 19; Creatinine, Ser 1.13; NT-Pro BNP 134; Potassium 4.9; Sodium 137 08/15/2023: Hemoglobin 12.8; Platelets 246  Recent Lipid Panel  Component Value Date/Time   CHOL 248 (H) 01/12/2022 1638   TRIG 67 01/12/2022 1638   HDL 116 01/12/2022 1638   CHOLHDL 2.1 01/12/2022 1638   LDLCALC 121 (H) 01/12/2022 1638    Physical Exam:    VS:  BP 124/78   Pulse 64   Ht 6' 2 (1.88 m)   Wt 297 lb 3.2 oz (134.8 kg)   SpO2 94%   BMI 38.16 kg/m     Wt Readings from Last 3 Encounters:  11/11/23 297 lb 3.2 oz (134.8 kg)  09/22/23 295 lb (133.8 kg)  09/22/23 295 lb 9.6 oz (134.1 kg)     GEN:  Well nourished, well developed in no acute distress HEENT: Normal NECK: No JVD; No carotid bruits LYMPHATICS: No lymphadenopathy CARDIAC: RRR, no murmurs, no rubs, no gallops RESPIRATORY:  Clear to auscultation without rales, wheezing or rhonchi  ABDOMEN: Soft, non-tender, non-distended MUSCULOSKELETAL:  No edema; No deformity  SKIN: Warm and dry LOWER EXTREMITIES: no swelling NEUROLOGIC:  Alert and oriented x 3 PSYCHIATRIC:  Normal affect   ASSESSMENT:    1. Dyslipidemia   2. Typical atrial flutter (HCC)   3. Aneurysm of ascending aorta without rupture (HCC)   4. Essential hypertension    PLAN:     In order of problems listed above:  Typical atrial flutter denies have any palpitation continue Xarelto . Dyslipidemia will recheck his fasting lipid profile last data I have is from almost a year ago with total cholesterol 288 and HDL 90.  Will recheck fasting lipid profile, arms of the ascending aorta measuring 44 mm.  Continue monitoring.  Only 1 mm increased compared to last year, essential hypertension blood pressure well-controlled   Medication Adjustments/Labs and Tests Ordered: Current medicines are reviewed at length with the patient today.  Concerns regarding medicines are outlined above.  Orders Placed This Encounter  Procedures   ALT   AST   Lipid panel   EKG 12-Lead   Medication changes: No orders of the defined types were placed in this encounter.   Signed, Lamar DOROTHA Fitch, MD, [facility] 11/11/2023 11:33 AM    [facility]

## 2023-11-12 LAB — LIPID PANEL
Chol/HDL Ratio: 1.9 ratio (ref 0.0–5.0)
Cholesterol, Total: 190 mg/dL (ref 100–199)
HDL: 101 mg/dL (ref >39)
LDL Chol Calc (NIH): 74 mg/dL (ref 0–99)
Triglycerides: 82 mg/dL (ref 0–149)
VLDL Cholesterol Cal: 15 mg/dL (ref 5–40)

## 2023-11-12 LAB — AST: AST: 20 IU/L (ref 0–40)

## 2023-11-12 LAB — ALT: ALT: 19 IU/L (ref 0–44)

## 2023-11-16 ENCOUNTER — Ambulatory Visit

## 2023-11-17 DIAGNOSIS — M0609 Rheumatoid arthritis without rheumatoid factor, multiple sites: Secondary | ICD-10-CM | POA: Diagnosis not present

## 2023-11-21 ENCOUNTER — Ambulatory Visit: Payer: Self-pay | Admitting: Cardiology

## 2023-11-22 DIAGNOSIS — Z6838 Body mass index (BMI) 38.0-38.9, adult: Secondary | ICD-10-CM | POA: Diagnosis not present

## 2023-11-22 DIAGNOSIS — Z79899 Other long term (current) drug therapy: Secondary | ICD-10-CM | POA: Diagnosis not present

## 2023-11-22 DIAGNOSIS — M1991 Primary osteoarthritis, unspecified site: Secondary | ICD-10-CM | POA: Diagnosis not present

## 2023-11-22 DIAGNOSIS — E669 Obesity, unspecified: Secondary | ICD-10-CM | POA: Diagnosis not present

## 2023-11-22 DIAGNOSIS — M0609 Rheumatoid arthritis without rheumatoid factor, multiple sites: Secondary | ICD-10-CM | POA: Diagnosis not present

## 2023-11-25 DIAGNOSIS — Z23 Encounter for immunization: Secondary | ICD-10-CM | POA: Diagnosis not present

## 2023-11-28 ENCOUNTER — Telehealth: Payer: Self-pay

## 2023-11-28 NOTE — Telephone Encounter (Signed)
Pt called again. Please advise.

## 2023-11-28 NOTE — Telephone Encounter (Signed)
 Lab Results reviewed with pt as per Dr. Karry note.  Pt verbalized understanding and had no additional questions. Routed to PCP

## 2023-11-28 NOTE — Telephone Encounter (Signed)
 Pt returning call to discuss test results. Please advise.

## 2023-12-01 ENCOUNTER — Ambulatory Visit: Admitting: Psychology

## 2023-12-06 ENCOUNTER — Encounter: Payer: Self-pay | Admitting: Student in an Organized Health Care Education/Training Program

## 2023-12-06 ENCOUNTER — Ambulatory Visit: Admitting: Student in an Organized Health Care Education/Training Program

## 2023-12-06 VITALS — BP 128/72 | HR 56 | Temp 97.5°F | Ht 74.0 in | Wt 307.4 lb

## 2023-12-06 DIAGNOSIS — R0602 Shortness of breath: Secondary | ICD-10-CM

## 2023-12-06 DIAGNOSIS — J4540 Moderate persistent asthma, uncomplicated: Secondary | ICD-10-CM | POA: Diagnosis not present

## 2023-12-06 MED ORDER — FLUTICASONE-SALMETEROL 115-21 MCG/ACT IN AERO: 2.0000 | INHALATION_SPRAY | Freq: Two times a day (BID) | RESPIRATORY_TRACT | 12 refills | Status: AC
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
## 2023-12-08 ENCOUNTER — Encounter: Attending: Psychology

## 2023-12-08 DIAGNOSIS — G4734 Idiopathic sleep related nonobstructive alveolar hypoventilation: Secondary | ICD-10-CM | POA: Insufficient documentation

## 2023-12-08 DIAGNOSIS — F09 Unspecified mental disorder due to known physiological condition: Secondary | ICD-10-CM | POA: Diagnosis not present

## 2023-12-08 DIAGNOSIS — R413 Other amnesia: Secondary | ICD-10-CM | POA: Insufficient documentation

## 2023-12-08 DIAGNOSIS — G4733 Obstructive sleep apnea (adult) (pediatric): Secondary | ICD-10-CM | POA: Insufficient documentation

## 2023-12-08 NOTE — Progress Notes (Signed)
 Behavioral Observations:  The patient appeared well-groomed and appropriately dressed. He was well-mannered and engaged throughout the assessment, but displayed contextually inappropriate/overly familiar behavior at times. During a rest break, the patient became slightly tearful without an apparent prompt and explained that this was because he was reflecting on the current state of the world. His overall attitude towards testing was positive and he demonstrated a good effort.  Neuropsychology Note  RONEY YOUTZ completed 125 minutes of neuropsychological testing with technician, Josue Ned, BA, under the supervision of Norleen Asa, PsyD., Clinical Neuropsychologist. The patient did not appear overtly distressed by the testing session, per behavioral observation or via self-report to the technician. Rest breaks were offered.   Clinical Decision Making: In considering the patient's current level of functioning, level of presumed impairment, nature of symptoms, emotional and behavioral responses during clinical interview, level of literacy, and observed level of motivation/effort, a battery of tests was selected by Dr. Asa during initial consultation on 11/08/2023. This was communicated to the technician. Communication between the neuropsychologist and technician was ongoing throughout the testing session and changes were made as deemed necessary based on patient performance on testing, technician observations and additional pertinent factors such as those listed above.  Tests Administered: Finger Tapping Test (FTT) Grooved Pegboard Wechsler Adult Intelligence Scale, 4th Edition (WAIS-IV) Wechsler Memory Scale, 4th Edition (WMS-IV); Older Adult Battery   Results:  FTT:  R (DH) Average= 38.2 Percentile Rank= 39th L (NDH) Average= 37 Percentile Rank= 39th  Grooved Pegboard:  R (DH) time= 112s Drops= 0 Percentile Rank= 42nd L (NDH) time= 120s Drops= 0  Percentile Rank=  43rd   WAIS-IV:  Composite Score Summary  Scale Sum of Scaled Scores Composite Score Percentile Rank 95% Conf. Interval Qualitative Description  Verbal Comprehension 39 VCI 116 86 110-121 High Average  Perceptual Reasoning 32 PRI 104 61 98-110 Average  Working Memory 18 WMI 95 37 89-102 Average  Processing Speed 20 PSI 100 50 92-108 Average  Full Scale 109 FSIQ 106 66 102-110 Average  General Ability 71 GAI 111 77 106-116 High Average   Verbal Comprehension Subtests Summary  Subtest Raw Score Scaled Score Percentile Rank Reference Group Scaled Score SEM  Similarities 29 14 91 12 1.12  Vocabulary 44 12 75 13 0.73  Information 19 13 84 14 0.73  The scaled scores in the Reference Group Scaled Score column are based on the performance of examinees aged 20:0-34:11 (i.e., the reference group). See Chapter 6 of the WAIS-IV Technical and Interpretive Manual for more information.  Perceptual Reasoning Subtests Summary  Subtest Raw Score Scaled Score Percentile Rank Reference Group Scaled Score SEM  Block Design 29 10 50 7 1.27  Matrix Reasoning 15 13 84 8 0.73  Visual Puzzles 9 9 37 6 0.99   Working Librarian, academic Raw Score Scaled Score Percentile Rank Reference Group Scaled Score SEM  Digit Span 23 10 50 7 0.79  Arithmetic 10 8 25 7  0.95   Processing Speed Subtests Summary  Subtest Raw Score Scaled Score Percentile Rank Reference Group Scaled Score SEM  Symbol Search 18 9 37 5 1.12  Coding 50 11 63 6 1.12   WMS-IV:   Index Score Summary  Index Sum of Scaled Scores Index Score Percentile Rank 95% Confidence Interval Qualitative Descriptor  Auditory Memory (AMI) 40 100 50 94-106 Average  Visual Memory (VMI) 22 105 63 100-110 Average  Immediate Memory (IMI) 30 100 50 94-106 Average  Delayed Memory (DMI) 32  104 61 96-111 Average    Primary Subtest Scaled Score Summary  Subtest Domain Raw Score Scaled Score Percentile Rank  Logical Memory I AM 35 12 75   Logical Memory II AM 18 11 63  Verbal Paired Associates I AM 15 8 25   Verbal Paired Associates II AM 5 9 37  Visual Reproduction I VM 28 10 50  Visual Reproduction II VM 21 12 75  Symbol Span VWM 13 9 37    Auditory Memory Process Score Summary  Process Score Raw Score Scaled Score Percentile Rank Cumulative Percentage (Base Rate)  LM II Recognition 18 - - 26-50%  VPA II Recognition 25 - - 17-25%   Visual Memory Process Score Summary  Process Score Raw Score Scaled Score Percentile Rank Cumulative Percentage (Base Rate)  VR II Recognition 4 - - 26-50%    ABILITY-MEMORY ANALYSIS  Ability Score:  VCI: 116 Date of Testing:  WAIS-IV; WMS-IV 2023/12/08  Predicted Difference Method   Index Predicted WMS-IV Index Score Actual WMS-IV Index Score Difference Critical Value  Significant Difference Y/N Base Rate  Auditory Memory 108 100 8 9.39 N   Visual Memory 107 105 2 8.28 N   Immediate Memory 109 100 9 10.49 N   Delayed Memory 108 104 4 12.08 N   Statistical significance (critical value) at the .01 level.    Feedback to Patient: STRAN RAPER will return on 03/19/2024 for an interactive feedback session with Dr. Corina at which time his test performances, clinical impressions and treatment recommendations will be reviewed in detail. The patient understands he can contact our office should he require our assistance before this time.  125 minutes spent face-to-face with patient administering standardized tests, 30 minutes spent scoring Radiographer, therapeutic). [CPT [id number], [id number]]  Full report to follow.

## 2023-12-12 ENCOUNTER — Other Ambulatory Visit: Payer: Self-pay | Admitting: Cardiology

## 2023-12-12 DIAGNOSIS — I4892 Unspecified atrial flutter: Secondary | ICD-10-CM

## 2023-12-12 NOTE — Telephone Encounter (Signed)
 Prescription refill request for Xarelto  received.  Indication:aflutter Last office visit:9/25 Weight:139.4  kg Age:78 Scr:1.13  6/25 CrCl:109.66  ml/min  Prescription refilled

## 2023-12-20 ENCOUNTER — Telehealth: Payer: Self-pay

## 2023-12-20 ENCOUNTER — Ambulatory Visit: Admitting: Neurology

## 2023-12-20 ENCOUNTER — Encounter: Payer: Self-pay | Admitting: Neurology

## 2023-12-20 VITALS — BP 138/74 | HR 75 | Resp 17 | Ht 74.0 in

## 2023-12-20 DIAGNOSIS — F09 Unspecified mental disorder due to known physiological condition: Secondary | ICD-10-CM

## 2023-12-20 DIAGNOSIS — G4733 Obstructive sleep apnea (adult) (pediatric): Secondary | ICD-10-CM

## 2023-12-20 DIAGNOSIS — G4734 Idiopathic sleep related nonobstructive alveolar hypoventilation: Secondary | ICD-10-CM

## 2023-12-20 NOTE — Patient Instructions (Signed)
 Mask refit for leaking Nightly use BIPAP for minimum 4 hours Follow up with Dr. Corina  Exercise, manage vascular risk factors.

## 2023-12-20 NOTE — Telephone Encounter (Signed)
 Community message sent to [facility] for CPAP orders

## 2023-12-20 NOTE — Progress Notes (Addendum)
 PATIENT: Brett Rosales DOB: 10/10/1945  REASON FOR VISIT: Follow up HISTORY FROM: Patient, wife PRIMARY NEUROLOGIST: Brett Rosales, Brett Rosales for sleep consult  ASSESSMENT AND PLAN 78 y.o. 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  Hypoxia.  Titration study found BiPAP 18/14 cm worked best)  -MOCA 24/30 today, worsening memory is correlated with poor BIPAP compliance. Will order mask refit for BIPAP. I will pull download in 6 weeks to check the data, right now mask is leaking, waking him up.  -Had repeat neuropsych evaluation, results are pending -I will reach out to DME, settings for BIPAP changed in July 2025 to 18/17 cm water , was at 18/14 cm, unclear why change? I spoke with DME, apparently settings changed on machine, patient recalls it getting knocked off the table, but didn't change any settings on purpose. Will switch back. I called the patient, he felt like it was blowing a lot.  -He restarted Aricept  10 mg nightly recently -We discussed check ATN profile, wishes to hold off  -Neuropsychological testing was not consistent with any pattern of a progressive degenerative neurological condition.  Felt some of the sleep study/ unusual sleep patterns could not completely rule out early stages of Lewy body type process.  Recommend reevaluation in 9 to 12 months. -MRI of the brain in July 2022 showed no acute abnormality, mild age-related atrophy SVD, moderate paranasal sinus disease -B12, TSH, RPR were normal -Follow up in 6 months  HISTORY  07/29/2020 Brett Rosales: Brett Rosales, is a 78 year old male, seen in request by his primary care physician Brett Rosales, [place] for evaluation of memory loss, excessive fatigue, he is accompanied by his wife at today's visit on Jul 30, 2020   I reviewed and summarized the referring note. PMHX. 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, has multiple degenerative joint disease, chronic low back pain, has been treated as rheumatoid arthritis for many years, use different medications, including Remicade , methotrexate, also had a history of atrial fibrillation, on chronic Xarelto    He was noted to have gradual onset of memory loss since his multiple joint replacement surgery in 2020, he had left hip replacement in 2020, left knee replacement in summer 2021, and right knee replacement in November 2021   Following all those general anesthesia, he was noted to have some changes, he often forgets to clean himself up after project, he used to be very good with direction, now often misses a turn when driving,   There was no family history of dementia, he was put on Aricept  5 mg few months ago, while he was taking every night, he complains of excessive dreams, improved after move the dosage to every morning, he is independent in his daily activity, MoCA examination is 23/30 today   Because of his multiple joints pain, he did not exercise regularly, did have weight gain, used to have loud snoring, sometimes up in the middle of the night catching his breath  Update February 11, 2021 SS: Had rotator cuff surgery 3 weeks ago, on the left arm. Saw Brett Rosales to have sleep study, didn't schedule yet.  B12, RPR, TSH were normal May 2022.  MRI of the brain in July 2022 showed no acute abnormality,  mild age-related atrophy SVD, moderate paranasal sinus disease  Taking Aricept  10 mg AM, noticed improvement reported by his wife, no more side effects. He is remembering things better, not asking as much repetitive questioning. Is driving, not getting lost. In stressful situations, can't process what he needs to do as quickly as before, will start stuttering, gets frustrated.  They are building a house which is stressful.   MOCA today was 28/30.  Update August 13, 2021 SS: [facility] 24/30, here with his wife, worsening memory loss, forgets household chores/tasks, stress from building a home, he did the hot water  heater installation, electrical wiring. Drives a car okay for now. Remains on Aricept  10 mg daily, his wife manages pill box. Never had sleep test, claims snoring is less. Taken 2 sleep tests in the past, was told he had sleep apnea, but data wasn't good, never came for in lab. Gets flustered with questioning. Takes Ritalin . Feels more irritable. Has lost 40 lbs in the last year, with increasing activity.  Update February 16, 2022: MOCA 25/30. Started namenda  10 mg BID last visit, remains on Aricept  10 mg daily. He feels memory is better. Staying on task better. He is managing building a new home, he is doing a good job. He is putting in cabinets, electrical work. Drives a car, no issues. No changes in behavioral, wife thinks is normal. Not interested in sleep study. Snores less.   Update August 18, 2022 SS: Last visit was referred to neuropsych, didn't have it, scheduled for next week with Brett Rosales.  Has continued on Namenda  10 mg BID, Aricept  10 mg daily. Today MOCA 25/30. He feels memory is better with change in season, feels mind is clearer and sharper with the longer daytime hours. He drives well. Wife has not noted any worsening. His house will be built in the summer, he has done most of the work. He does have trouble with measurements, may cut it short. Has vivid dreams, still takes Aricept  in the AM, will last for few nights then go away. Not interested in sleep study, still snores. He makes jokes.   Update February 08, 2023 SS: Had HST 11/11/2022, confirmed severe sleep apnea along with severe hypoxia.  CPAP titration 12/06/2022, complex apnea responded to BiPAP 18/14 cmH2O under FFM.  Had neuropsychological evaluation with Brett Rosales in September 2024 not  consistent with any type of progressive degenerative neurologic condition.  Plan to reevaluate in 9 to 12 months.  Started BiPAP 01/04/2023.  Download shows superb usage greater than 4 hours 97%, AHI 1.8, leak 43.5. He is sleeping better, he feels more rested. Wife notes his color is better, unclear if memory improvement? He did leave his phone at home. He stopped Aricept  and Namenda , weird dreams have resolved. ESS 1. His has less pain in the AM. Sleep is calmer, no longer restless, not running legs in bed. Uses FFM, under the nose, got new mask #2 few weeks ago, looks like less leak on data.  His wife denies any issues driving.  Update December 20, 2023 SS: Saw Brett Rosales, reordered neuro psych testing, recently felt cognition decline but correlated with poor BIPAP use. Goes back in Jan to get report. BIPAP download 77% usage, 67% > 4 hours, 18 cm IPAP/17 cm EPAP, leak 46.1, AHI 1.8. trouble wearing BIPAP due to mask leaking, using under the nose/over mouth. Has developed asthma. The leaking is keeping him awake. Wife mentions he tosses and turns without CPAP at night. In the afternoons  he is very chatty. His memory is better with CPAP, moves better. He restarted Aricept  10 mg daily. MOCA 24/30.   REVIEW OF SYSTEMS: Out of a complete 14 system review of symptoms, the patient complains only of the following symptoms, and all other reviewed systems are negative.  See HPI  ALLERGIES: Allergies  Allergen Reactions   Nsaids Other (See Comments)    Eats hole in stomach.SABRASABRASABRAIbuprofen, Meloxicam...  Other Reaction(s): GI Intolerance   Hydroxychloroquine Other (See Comments)   Ibuprofen      Causes ulcers in my stomach.   Statins Other (See Comments)    Myalgias    HOME MEDICATIONS: Outpatient Medications Prior to Visit  Medication Sig Dispense Refill   albuterol (PROVENTIL) (2.5 MG/3ML) 0.083% nebulizer solution Take 2.5 mg by nebulization every 6 (six) hours as needed for wheezing or  shortness of breath.     albuterol (VENTOLIN HFA) 108 (90 Base) MCG/ACT inhaler Inhale 2 puffs into the lungs as needed for wheezing or shortness of breath.     amLODipine  (NORVASC ) 5 MG tablet Take 5 mg by mouth daily.     B Complex Vitamins (VITAMIN B COMPLEX) TABS Take 1 tablet by mouth daily.     Cholecalciferol (VITAMIN D3 PO) Take 2,000 Units by mouth daily.     DULoxetine (CYMBALTA) 30 MG capsule Take 30 mg by mouth daily.     fluticasone -salmeterol (ADVAIR HFA) 115-21 MCG/ACT inhaler Inhale 2 puffs into the lungs 2 (two) times daily. 1 each 12   hydrochlorothiazide  (HYDRODIURIL ) 25 MG tablet TAKE 1 TABLET DAILY 90 tablet 3   inFLIXimab  (REMICADE  IV) Inject 1 vial into the vein See admin instructions. Every 6 weeks/ Unknown strenght     losartan  (COZAAR ) 100 MG tablet Take 100 mg by mouth daily.     methylphenidate  (RITALIN ) 5 MG tablet Take 5 mg by mouth 2 (two) times daily.     metoprolol  tartrate (LOPRESSOR ) 25 MG tablet Take 1 tablet (25 mg total) by mouth 2 (two) times daily. 180 tablet 3   montelukast (SINGULAIR) 10 MG tablet Take 10 mg by mouth daily.     rosuvastatin (CRESTOR) 10 MG tablet Take 10 mg by mouth at bedtime.     Spacer/Aero-Holding Chambers (AEROCHAMBER MV) inhaler Use as instructed 1 each 0   spironolactone  (ALDACTONE ) 25 MG tablet Take 0.5 tablets (12.5 mg total) by mouth daily. 45 tablet 3   traZODone (DESYREL) 50 MG tablet Take 50 mg by mouth at bedtime as needed for sleep.     XARELTO  20 MG TABS tablet TAKE 1 TABLET DAILY WITH SUPPER 90 tablet 3   No facility-administered medications prior to visit.    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   Depression    Dyslipidemia 01/23/2019   Dyspnea on exertion 12/09/2017   Dysrhythmia 11/2017   a flutter   Encounter for  long-term (current) use of high-risk medication 08/09/2023   Essential hypertension 12/09/2017   History of kidney stones    13 stones   Hypertension    Lumbar spondylosis 10/20/2021   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/29/2020   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 it was inconclusive. needs follow up   Snoring 08/13/2021   Supraventricular tachycardia 12/09/2017   Swelling of left lower extremity 05/17/2018   Typical atrial flutter (HCC) 12/16/2017    PAST SURGICAL HISTORY: Past Surgical History:  Procedure Laterality Date   TONSILLECTOMY AND ADENOIDECTOMY     TOTAL HIP ARTHROPLASTY Left 09/21/2018   Procedure: TOTAL HIP ARTHROPLASTY ANTERIOR APPROACH;  Surgeon: Fidel Rogue, MD;  Location: [facility];  Service: Orthopedics;  Laterality: Left;   TOTAL KNEE ARTHROPLASTY Left 09/24/2019   Procedure: TOTAL KNEE ARTHROPLASTY;  Surgeon: Rubie Kemps, MD;  Location: [facility];  Service:  Orthopedics;  Laterality: Left;   TOTAL KNEE ARTHROPLASTY Right 02/04/2020   Procedure: TOTAL KNEE ARTHROPLASTY;  Surgeon: Rubie Kemps, MD;  Location: [facility];  Service: Orthopedics;  Laterality: Right;   VASECTOMY  1990    FAMILY HISTORY: Family History  Problem Relation Age of Onset   Diabetes Mother    Cancer Mother    Hypertension Father    Diabetes Paternal Grandmother     SOCIAL HISTORY: Social History   Socioeconomic History   Marital status: Married    Spouse name: [name]   Number of children: 3   Years of education: Not on file   Highest education level: High school graduate  Occupational History   Not on file  Tobacco Use   Smoking status: Former    Current packs/day: 0.00    Average packs/day: 1 pack/day for 15.0 years (15.0 ttl pk-yrs)    Types: Cigarettes    Start date: 01/30/1971    Quit date: 01/29/1986    Years since quitting: 37.9   Smokeless tobacco: Never  Vaping Use   Vaping status: Never Used  Substance and Sexual Activity   Alcohol use: No    Alcohol/week: 0.0 standard drinks of alcohol   Drug use: No   Sexual activity: Yes    Birth control/protection: None  Other Topics Concern   Not on file  Social History Narrative   Lives with wife   Right handed   Caffeine: occa drinks coffee, no energy drink. Ginger tea   Social Drivers of Corporate investment banker Strain: Not on file  Food Insecurity: Not on file  Transportation Needs: Not on file  Physical Activity: Not on file  Stress: Not on file  Social Connections: Not on file  Intimate Partner Violence: Not on file       12/20/2023    1:16 PM 02/08/2023    3:44 PM 08/18/2022   11:23 AM 02/16/2022    2:52 PM 08/13/2021    1:38 PM  Montreal Cognitive Assessment  Visuospatial/ Executive (0/5) 5 3 5 5 5   Naming (0/3) 3 1 3 2 3   Attention: Read list of digits (0/2) 2 2 1 2 2   Attention: Read list of letters (0/1) 1 1 1 1 1   Attention: Serial 7 subtraction starting at 100 (0/3) 0 1 2  2 3   Language: Repeat phrase (0/2) 2 2 2 2 1   Language : Fluency (0/1) 1 1 1  0 1  Abstraction (0/2) 2 2 2 2 2   Delayed Recall (0/5) 3 2 2 3 1   Orientation (0/6) 5 6 5 5 5   Total 24 21 24 24 24   Adjusted Score (based on education) 24  25 25     PHYSICAL EXAM  Vitals:   12/20/23 1250 12/20/23 1255  BP: (!) 149/76 138/74  Pulse: 75   Resp: 17   SpO2: 98%   Height: 6' 2 (1.88 m)     Body mass index is 39.47 kg/m.  Generalized: Well developed, in no acute distress, very pleasant, makes jokes Neurological examination  Mentation: Alert oriented to time, place, history taking. Follows all commands speech and language fluent Cranial nerve II-XII: Pupils were equal round reactive to light. Extraocular movements were full, visual field were full on confrontational test. Facial sensation and strength were normal.  Motor: Good strength throughout Sensory: Sensory testing is intact to soft touch on all 4 extremities. No evidence of extinction is noted.  Coordination: Cerebellar testing reveals good finger-nose-finger bilaterally  Gait and station: Gait is normal , independent  DIAGNOSTIC DATA (LABS, IMAGING, TESTING) - I reviewed patient records, labs, notes, testing and imaging myself where available.  Lab Results  Component Value Date   WBC 8.1 08/15/2023   HGB 12.8 (L) 08/15/2023   HCT 39.3 08/15/2023   MCV 98.0 08/15/2023   PLT 246 08/15/2023      Component Value Date/Time   NA 137 08/10/2023 1127   K 4.9 08/10/2023 1127   CL 105 08/10/2023 1127   CO2 18 (L) 08/10/2023 1127   GLUCOSE 90 08/10/2023 1127   GLUCOSE 99 01/30/2020 1142   BUN 19 08/10/2023 1127   CREATININE 1.13 08/10/2023 1127   CALCIUM 9.4 08/10/2023 1127   PROT 6.9 01/30/2020 1142   ALBUMIN  3.9 01/30/2020 1142   AST 20 11/11/2023 1137   ALT 19 11/11/2023 1137   ALKPHOS 72 01/30/2020 1142   BILITOT 0.5 01/30/2020 1142   GFRNONAA >60 01/30/2020 1142   GFRAA >60 09/20/2019 1204   Lab Results  Component  Value Date   CHOL 190 11/11/2023   HDL 101 11/11/2023   LDLCALC 74 11/11/2023   TRIG 82 11/11/2023   CHOLHDL 1.9 11/11/2023   No results found for: HGBA1C Lab Results  Component Value Date   VITAMINB12 360 07/29/2020   Lab Results  Component Value Date   TSH 2.320 07/29/2020   Lauraine Born, AGNP-C, DNP 12/20/2023, 1:19 PM [facility] [address] [place], [place] [address] [phone]

## 2023-12-20 NOTE — Progress Notes (Signed)
 Mask refit order sent to [facility] via community message in [facility]

## 2023-12-28 ENCOUNTER — Other Ambulatory Visit (HOSPITAL_BASED_OUTPATIENT_CLINIC_OR_DEPARTMENT_OTHER): Payer: Self-pay

## 2023-12-28 MED ORDER — METHYLPHENIDATE HCL 5 MG PO TABS
10.0000 mg | ORAL_TABLET | Freq: Every day | ORAL | 0 refills | Status: AC
  Filled 2023-12-28: qty 60, 30d supply, fill #0

## 2023-12-29 DIAGNOSIS — M0609 Rheumatoid arthritis without rheumatoid factor, multiple sites: Secondary | ICD-10-CM | POA: Diagnosis not present

## 2024-01-30 ENCOUNTER — Other Ambulatory Visit (HOSPITAL_BASED_OUTPATIENT_CLINIC_OR_DEPARTMENT_OTHER): Payer: Self-pay

## 2024-01-30 MED ORDER — METHYLPHENIDATE HCL 5 MG PO TABS
10.0000 mg | ORAL_TABLET | Freq: Every day | ORAL | 0 refills | Status: AC | PRN
Start: 2024-01-30 — End: ?
  Filled 2024-01-30: qty 60, 30d supply, fill #0

## 2024-01-31 ENCOUNTER — Telehealth: Payer: Self-pay | Admitting: Neurology

## 2024-01-31 NOTE — Telephone Encounter (Signed)
 Lvm 1st attempt by hf 01/31/24

## 2024-01-31 NOTE — Telephone Encounter (Signed)
 Please call patient. I had sent myself a reminder to check on his mask leak on BIPAP. Does not look like he has been using lately. Check to see if anything needed from us  to help. Thanks

## 2024-01-31 NOTE — Telephone Encounter (Signed)
 Patient returned phone call,would like a call back.

## 2024-02-01 ENCOUNTER — Other Ambulatory Visit (HOSPITAL_BASED_OUTPATIENT_CLINIC_OR_DEPARTMENT_OTHER): Payer: Self-pay

## 2024-02-06 NOTE — Telephone Encounter (Signed)
 Called and spoke to pt and he stated that he cannot wear bipap due to getting out of breath.

## 2024-02-07 NOTE — Telephone Encounter (Signed)
 I called the patient.  Has not been able to use his BiPAP lately due to respiratory virus issues and asthma. He continues to have a constant cough and drainage.  Motivated to return to BiPAP use.  Advised him to follow-up with pulmonology. Keep me posted.

## 2024-02-10 ENCOUNTER — Ambulatory Visit: Admitting: Student in an Organized Health Care Education/Training Program

## 2024-02-20 DIAGNOSIS — Z125 Encounter for screening for malignant neoplasm of prostate: Secondary | ICD-10-CM | POA: Diagnosis not present

## 2024-02-20 DIAGNOSIS — Z131 Encounter for screening for diabetes mellitus: Secondary | ICD-10-CM | POA: Diagnosis not present

## 2024-02-20 DIAGNOSIS — J3089 Other allergic rhinitis: Secondary | ICD-10-CM | POA: Diagnosis not present

## 2024-02-20 DIAGNOSIS — Z79899 Other long term (current) drug therapy: Secondary | ICD-10-CM | POA: Diagnosis not present

## 2024-02-20 DIAGNOSIS — I10 Essential (primary) hypertension: Secondary | ICD-10-CM | POA: Diagnosis not present

## 2024-02-20 DIAGNOSIS — J4541 Moderate persistent asthma with (acute) exacerbation: Secondary | ICD-10-CM | POA: Diagnosis not present

## 2024-02-20 DIAGNOSIS — E7800 Pure hypercholesterolemia, unspecified: Secondary | ICD-10-CM | POA: Diagnosis not present

## 2024-02-20 DIAGNOSIS — K219 Gastro-esophageal reflux disease without esophagitis: Secondary | ICD-10-CM | POA: Diagnosis not present

## 2024-02-20 DIAGNOSIS — Z0000 Encounter for general adult medical examination without abnormal findings: Secondary | ICD-10-CM | POA: Diagnosis not present

## 2024-02-21 DIAGNOSIS — R5383 Other fatigue: Secondary | ICD-10-CM | POA: Diagnosis not present

## 2024-02-21 DIAGNOSIS — M0609 Rheumatoid arthritis without rheumatoid factor, multiple sites: Secondary | ICD-10-CM | POA: Diagnosis not present

## 2024-02-21 DIAGNOSIS — Z111 Encounter for screening for respiratory tuberculosis: Secondary | ICD-10-CM | POA: Diagnosis not present

## 2024-02-23 ENCOUNTER — Encounter: Attending: Psychology | Admitting: Psychology

## 2024-02-23 DIAGNOSIS — G4734 Idiopathic sleep related nonobstructive alveolar hypoventilation: Secondary | ICD-10-CM | POA: Diagnosis not present

## 2024-02-23 DIAGNOSIS — G4733 Obstructive sleep apnea (adult) (pediatric): Secondary | ICD-10-CM | POA: Diagnosis not present

## 2024-02-23 DIAGNOSIS — F09 Unspecified mental disorder due to known physiological condition: Secondary | ICD-10-CM | POA: Diagnosis not present

## 2024-02-23 DIAGNOSIS — R413 Other amnesia: Secondary | ICD-10-CM | POA: Diagnosis not present

## 2024-02-27 ENCOUNTER — Other Ambulatory Visit (HOSPITAL_BASED_OUTPATIENT_CLINIC_OR_DEPARTMENT_OTHER): Payer: Self-pay

## 2024-02-27 MED ORDER — METHYLPHENIDATE HCL 5 MG PO TABS
10.0000 mg | ORAL_TABLET | Freq: Every day | ORAL | 0 refills | Status: DC
  Filled 2024-02-29: qty 60, 30d supply, fill #0

## 2024-02-28 ENCOUNTER — Other Ambulatory Visit (HOSPITAL_BASED_OUTPATIENT_CLINIC_OR_DEPARTMENT_OTHER): Payer: Self-pay

## 2024-02-29 ENCOUNTER — Other Ambulatory Visit (HOSPITAL_BASED_OUTPATIENT_CLINIC_OR_DEPARTMENT_OTHER): Payer: Self-pay

## 2024-03-13 ENCOUNTER — Other Ambulatory Visit: Payer: Self-pay

## 2024-03-13 ENCOUNTER — Other Ambulatory Visit (HOSPITAL_BASED_OUTPATIENT_CLINIC_OR_DEPARTMENT_OTHER): Payer: Self-pay

## 2024-03-14 ENCOUNTER — Ambulatory Visit: Admitting: Psychology

## 2024-03-14 ENCOUNTER — Other Ambulatory Visit (HOSPITAL_BASED_OUTPATIENT_CLINIC_OR_DEPARTMENT_OTHER): Payer: Self-pay

## 2024-03-14 MED ORDER — ALBUTEROL SULFATE HFA 108 (90 BASE) MCG/ACT IN AERS
1.0000 | INHALATION_SPRAY | RESPIRATORY_TRACT | 0 refills | Status: AC
  Filled 2024-03-14: qty 6.7, 25d supply, fill #0

## 2024-03-16 ENCOUNTER — Ambulatory Visit: Admitting: Student in an Organized Health Care Education/Training Program

## 2024-03-19 ENCOUNTER — Ambulatory Visit: Admitting: Psychology

## 2024-03-26 ENCOUNTER — Other Ambulatory Visit (HOSPITAL_BASED_OUTPATIENT_CLINIC_OR_DEPARTMENT_OTHER): Payer: Self-pay

## 2024-03-28 ENCOUNTER — Other Ambulatory Visit (HOSPITAL_BASED_OUTPATIENT_CLINIC_OR_DEPARTMENT_OTHER): Payer: Self-pay

## 2024-03-28 MED ORDER — METHYLPHENIDATE HCL 5 MG PO TABS
10.0000 mg | ORAL_TABLET | Freq: Every day | ORAL | 0 refills | Status: AC
  Filled 2024-03-28: qty 60, 30d supply, fill #0

## 2024-03-30 ENCOUNTER — Other Ambulatory Visit (HOSPITAL_BASED_OUTPATIENT_CLINIC_OR_DEPARTMENT_OTHER): Payer: Self-pay

## 2024-04-03 ENCOUNTER — Encounter: Admitting: Psychology

## 2024-04-03 ENCOUNTER — Encounter: Payer: Self-pay | Admitting: Psychology

## 2024-04-03 NOTE — Progress Notes (Signed)
 Neuropsychological Evaluation   Patient:  Brett Rosales   DOB: 26-Feb-1946  MR Number: [id number]  Location: [facility] [address] [place], [address] [place] [address] Dept: [phone]  Start: 4 PM End: 5 PM  Provider/Observer:     Brett JONELLE Asa PsyD  Chief Complaint:      Chief Complaint  Patient presents with   Memory Loss   Stress   Sleeping Problem   Date of Evaluation: 12/08/2023 Referring Provider: Lauraine Born, NP - [facility] Previous Evaluation: 09/15/2022 Examiner: Brett Rosales, Ph.D., Clinical Neuropsychologist   SUMMARY This evaluation shows overall cognitive improvement from 2024 to 2025, particularly in reasoning and memory abilities, during periods of adequate oxygenation with BiPAP use. Recent cognitive fluctuations correlate strongly with pulmonary instability and reduced BiPAP adherence--not neurodegenerative decline. The findings support a reversible, medically-based cognitive disorder, with substantial potential for improvement once respiratory function stabilizes.  Reason For Service:     Brett Rosales is a 79 year old male referred for a follow-up neuropsychological evaluation to assess ongoing cognitive changes in the context of known severe obstructive sleep apnea (OSA), recent pulmonary illness and asthma, interrupted BiPAP use, and prior history of mild cognitive disorder monitored since 2022.  The referral questions include: Whether cognitive functioning has changed since the 2024 evaluation The degree to which sleep-disordered breathing, hypoxemia, pulmonary disease, or other medical factors may be contributing to cognitive symptoms Whether findings are consistent with a progressive neurodegenerative process  Condensed History Below:  Full background history can be found in patient's EMR and my previous reports dated6/27/2024  and 11/08/2023.  BACKGROUND AND HISTORY Initial Clinical History (2019-2024) The patient and spouse report a gradual onset of memory problems beginning around 2019-2020, coinciding with the COVID era and a period of significant psychosocial stress. During this time: The patient underwent multiple orthopedic surgeries (hip and bilateral knee replacements), after which the wife noted more pronounced memory problems. Psychosocial strain was high due to caregiver responsibilities for ill family members. Symptoms included misplacing items, forgetting recent conversations, and occasional mild spatial disorientation. MoCA scores ranged 23/30-28/30 over several years. He was known to snore loudly, had witnessed apneas, and previously discontinued CPAP despite a remote OSA diagnosis 25 years prior. A sleep study on 11/09/2022 demonstrated severe OSA with marked hypoxemia, prompting initiation of BiPAP therapy, which resulted in noticeable improvement in cognitive clarity, energy, and daytime functioning.  Interval History (2024-2025) During follow-up: Both patient and spouse reported significant initial improvement in memory, alertness, and overall functioning with regular BiPAP use. In the past 4-6 months, the patient developed: New asthma diagnosis with persistent shortness of breath Several episodes of bronchitis Possible COVID infection Marked reduction in physical activity Oxygen saturations dropping into the 80s, once to 79% Due to recent respiratory difficulty, the patient has been non-adherent with BiPAP for the past month. Concurrently, the patient reports worsening memory, reduced focus, and word-finding difficulty, correlating strongly with worsening respiratory status and hypoxemia episodes. He discontinued Aricept  due to bowel incontinence and stopped Namenda  previously. No hallucinations, significant personality changes, delusions, or tremors are currently reported. No epilepsy, stroke  events, or TIA symptoms have occurred. 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  BEHAVIORAL OBSERVATIONS / MENTAL STATUS The patient was cooperative, appropriate, and engaged. Attention/Concentration: Adequate Speech: Normal volume/quality; patient reports word-finding lapses Thought Process: Organized, logical, coherent Memory:  Remote memory intact; recent memory impaired (e.g., unable to recall previous night's dinner until cued) Affect/Mood: Euthymic, appropriate Insight: Good Judgment: Intact Pain/Physical Status: Reports dyspnea, dizziness with exertion Presentation was not suggestive of dementia, psychosis, delirium, or major psychiatric instability.  Tests Administered: Finger Tapping Test (FTT) Grooved Pegboard Wechsler Adult Intelligence Scale, 4th Edition (WAIS-IV) Wechsler Memory Scale, 4th Edition (WMS-IV); Older Adult Battery    Comparison made between: 09/15/2022 (baseline evaluation) 12/08/2023 (current evaluation)  TEST RESULTS I. Motor Speed & Dexterity Finger Tapping Test (FTT) Measure 2024 Percentile 2025 Percentile Interpretation  R (dominant) 45th 39th Mild decline  L (non-dominant) 62nd 39th Moderate decline  Summary: Decline in simple motor speed bilaterally.  Grooved Pegboard Measure 2024 Percentile 2025 Percentile Interpretation  Right hand 40th 42nd Stable / slight improvement  Left hand 40th 43rd Stable / slight improvement  Summary: Fine motor dexterity and visuomotor coordination remain stable.  II. Cognitive Functioning (WAIS-IV) Index-Level Changes Index 2024 2025 Change  VCI 107 (68th) 116 (86th) Improved  PRI 96 (39th) 104 (61st) Improved  WMI 100 (50th) 95 (37th) Slight decline  PSI 108 (70th) 100 (50th) Slight decline  FSIQ 103 106 Stable  GAI 101 111 Improved  Interpretation: Significant strengthening in verbal and perceptual  reasoning. Mild reductions in working memory and processing speed, still within Average range. Strengthening of higher-order reasoning reflected in improved GAI. Overall profile not consistent with neurodegenerative dementia.  III. Memory Functioning (WMS-IV) Index-Level Comparison Index 2024 2025 Change  AMI 94 (34th) 100 (50th) Improved  VMI 98 (45th) 105 (63rd) Improved  IMI 95 (37th) 100 (50th) Improved  DMI 93 (32nd) 104 (61st) Significant improvement  Interpretation: Memory functioning improved across all domains, especially delayed recall, strongly paralleling periods of consistent BiPAP use and improved sleep/oxygenation. This confirms sleep-disordered breathing had a significant impact on memory in 2024.  INTERPRETATION AND INTEGRATION 1. Cognitive profile is not consistent with a progressive neurodegenerative disorder. Improved reasoning performance Improved encoding and retention of new information No persistent declines across multiple cognitive domains This pattern does not fit Alzheimer's disease, Lewy body disease, or frontotemporal dementia.  2. Cognitive performance is strongly influenced by physiological factors Cognitive fluctuations correlate directly with: Quality of sleep Adequacy of oxygenation Adherence to BiPAP Respiratory stability Severity of asthma episodes Recent hypoxemia Periods of BiPAP consistency produced significant cognitive recovery. Periods of respiratory distress with oxygen saturation in the 70s-80s resulted in reduced word finding and memory problems.  3. 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  4. Current cognitive complaints are most likely due to Poorly controlled asthma Frequent  hypoxemia episodes Inconsistent BiPAP use Daytime fatigue Decreased physical activity Post-illness effects (recent bronchitis/COVID-like illness) There is no evidence of irreversible cognitive decline. Symptoms should improve with restoration of stable pulmonary function and return to BiPAP adherence.  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 Immediate return to consistent BiPAP use Critical to stabilize cognitive functioning. 2. Optimize asthma management Work closely with pulmonology to reduce hypoxemic episodes. 3. Increase physical activity as tolerated 4. Sleep  hygiene interventions 5. Memory strategies: Use external memory supports Reduce multitasking Increase structured routines 6. Repeat neuropsychological evaluation In 12-18 months or sooner only if significant changes occur.  SUMMARY This evaluation shows overall cognitive improvement from 2024 to 2025, particularly in reasoning and memory abilities, during periods of adequate oxygenation with BiPAP use. Recent cognitive fluctuations correlate strongly with pulmonary instability and reduced BiPAP adherence--not neurodegenerative decline. The findings support a reversible, medically-based cognitive disorder, with substantial potential for improvement once respiratory function stabilizes.   Diagnosis:    Mild cognitive disorder  Chronic intermittent hypoxia with obstructive sleep apnea  Obstructive sleep apnea syndrome  Memory  loss   _____________________ Brett Rosales, Psy.D. Clinical Neuropsychologist

## 2024-07-03 ENCOUNTER — Encounter: Admitting: Psychology

## 2024-07-04 ENCOUNTER — Encounter: Admitting: Psychology

## 2024-07-19 ENCOUNTER — Ambulatory Visit: Admitting: Neurology
